# Patient Record
Sex: Female | Born: 1985 | Race: Black or African American | Hispanic: No | Marital: Married | Smoking: Former smoker
Health system: Southern US, Community
[De-identification: ages and names within clinical notes are randomized; demographics above are authoritative.]

## PROBLEM LIST (undated history)

## (undated) DIAGNOSIS — IMO0002 Reserved for concepts with insufficient information to code with codable children: Secondary | ICD-10-CM

## (undated) DIAGNOSIS — O24419 Gestational diabetes mellitus in pregnancy, unspecified control: Secondary | ICD-10-CM

## (undated) DIAGNOSIS — Z319 Encounter for procreative management, unspecified: Secondary | ICD-10-CM

## (undated) DIAGNOSIS — N920 Excessive and frequent menstruation with regular cycle: Secondary | ICD-10-CM

## (undated) DIAGNOSIS — I890 Lymphedema, not elsewhere classified: Secondary | ICD-10-CM

## (undated) DIAGNOSIS — E282 Polycystic ovarian syndrome: Secondary | ICD-10-CM

## (undated) DIAGNOSIS — N912 Amenorrhea, unspecified: Secondary | ICD-10-CM

## (undated) DIAGNOSIS — N926 Irregular menstruation, unspecified: Secondary | ICD-10-CM

## (undated) DIAGNOSIS — F329 Major depressive disorder, single episode, unspecified: Secondary | ICD-10-CM

## (undated) DIAGNOSIS — D649 Anemia, unspecified: Secondary | ICD-10-CM

## (undated) DIAGNOSIS — R87629 Unspecified abnormal cytological findings in specimens from vagina: Secondary | ICD-10-CM

## (undated) DIAGNOSIS — F32A Depression, unspecified: Secondary | ICD-10-CM

## (undated) HISTORY — DX: Polycystic ovarian syndrome: E28.2

## (undated) HISTORY — DX: Reserved for concepts with insufficient information to code with codable children: IMO0002

## (undated) HISTORY — DX: Major depressive disorder, single episode, unspecified: F32.9

## (undated) HISTORY — DX: Irregular menstruation, unspecified: N92.6

## (undated) HISTORY — DX: Depression, unspecified: F32.A

## (undated) HISTORY — PX: WISDOM TOOTH EXTRACTION: SHX21

## (undated) HISTORY — DX: Encounter for procreative management, unspecified: Z31.9

## (undated) HISTORY — DX: Excessive and frequent menstruation with regular cycle: N92.0

## (undated) HISTORY — DX: Unspecified abnormal cytological findings in specimens from vagina: R87.629

## (undated) HISTORY — DX: Amenorrhea, unspecified: N91.2

## (undated) HISTORY — DX: Lymphedema, not elsewhere classified: I89.0

## (undated) HISTORY — DX: Gestational diabetes mellitus in pregnancy, unspecified control: O24.419

## (undated) HISTORY — DX: Morbid (severe) obesity due to excess calories: E66.01

---
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
## 2004-10-04 ENCOUNTER — Emergency Department (HOSPITAL_COMMUNITY): Admission: EM | Admit: 2004-10-04 | Discharge: 2004-10-04 | Payer: Self-pay | Admitting: Emergency Medicine

## 2004-11-11 ENCOUNTER — Inpatient Hospital Stay (HOSPITAL_COMMUNITY): Admission: AD | Admit: 2004-11-11 | Discharge: 2004-11-15 | Payer: Self-pay | Admitting: Internal Medicine

## 2005-09-13 ENCOUNTER — Emergency Department (HOSPITAL_COMMUNITY): Admission: EM | Admit: 2005-09-13 | Discharge: 2005-09-13 | Payer: Self-pay | Admitting: Emergency Medicine

## 2005-12-18 ENCOUNTER — Inpatient Hospital Stay (HOSPITAL_COMMUNITY): Admission: EM | Admit: 2005-12-18 | Discharge: 2005-12-21 | Payer: Self-pay | Admitting: Emergency Medicine

## 2008-07-11 ENCOUNTER — Inpatient Hospital Stay (HOSPITAL_COMMUNITY): Admission: EM | Admit: 2008-07-11 | Discharge: 2008-07-14 | Payer: Self-pay | Admitting: Emergency Medicine

## 2008-07-14 ENCOUNTER — Encounter: Payer: Self-pay | Admitting: Family Medicine

## 2008-09-18 ENCOUNTER — Inpatient Hospital Stay (HOSPITAL_COMMUNITY): Admission: EM | Admit: 2008-09-18 | Discharge: 2008-09-20 | Payer: Self-pay | Admitting: Emergency Medicine

## 2009-02-05 ENCOUNTER — Ambulatory Visit: Payer: Self-pay | Admitting: Family Medicine

## 2009-02-05 DIAGNOSIS — R5383 Other fatigue: Secondary | ICD-10-CM

## 2009-02-05 DIAGNOSIS — I890 Lymphedema, not elsewhere classified: Secondary | ICD-10-CM

## 2009-02-05 DIAGNOSIS — R5381 Other malaise: Secondary | ICD-10-CM

## 2009-02-05 DIAGNOSIS — J309 Allergic rhinitis, unspecified: Secondary | ICD-10-CM

## 2009-02-09 ENCOUNTER — Telehealth: Payer: Self-pay | Admitting: Family Medicine

## 2009-02-09 LAB — CONVERTED CEMR LAB
Basophils Absolute: 0 10*3/uL (ref 0.0–0.1)
Chloride: 105 meq/L (ref 96–112)
Cholesterol: 216 mg/dL — ABNORMAL HIGH (ref 0–200)
Creatinine, Ser: 0.78 mg/dL (ref 0.40–1.20)
Eosinophils Absolute: 0.1 10*3/uL (ref 0.0–0.7)
HDL: 41 mg/dL (ref >39)
LDL Cholesterol: 134 mg/dL — ABNORMAL HIGH (ref 0–99)
Lymphocytes Relative: 25 % (ref 12–46)
Lymphs Abs: 3 10*3/uL (ref 0.7–4.0)
Neutrophils Relative %: 67 % (ref 43–77)
Platelets: 292 10*3/uL (ref 150–400)
Potassium: 4.3 meq/L (ref 3.5–5.3)
Triglycerides: 204 mg/dL — ABNORMAL HIGH (ref <150)
WBC: 11.7 10*3/uL — ABNORMAL HIGH (ref 4.0–10.5)

## 2009-02-16 ENCOUNTER — Ambulatory Visit (HOSPITAL_COMMUNITY): Admission: RE | Admit: 2009-02-16 | Discharge: 2009-02-16 | Payer: Self-pay | Admitting: Family Medicine

## 2009-02-19 ENCOUNTER — Inpatient Hospital Stay (HOSPITAL_COMMUNITY): Admission: EM | Admit: 2009-02-19 | Discharge: 2009-02-21 | Payer: Self-pay | Admitting: Emergency Medicine

## 2009-02-27 ENCOUNTER — Encounter: Payer: Self-pay | Admitting: Family Medicine

## 2009-04-28 ENCOUNTER — Encounter (INDEPENDENT_AMBULATORY_CARE_PROVIDER_SITE_OTHER): Payer: Self-pay | Admitting: *Deleted

## 2009-06-05 ENCOUNTER — Emergency Department (HOSPITAL_COMMUNITY): Admission: EM | Admit: 2009-06-05 | Discharge: 2009-06-05 | Payer: Self-pay | Admitting: Emergency Medicine

## 2009-06-09 ENCOUNTER — Ambulatory Visit: Payer: Self-pay | Admitting: Family Medicine

## 2009-07-27 ENCOUNTER — Inpatient Hospital Stay (HOSPITAL_COMMUNITY): Admission: EM | Admit: 2009-07-27 | Discharge: 2009-07-30 | Payer: Self-pay | Admitting: Emergency Medicine

## 2009-12-14 ENCOUNTER — Encounter: Payer: Self-pay | Admitting: Family Medicine

## 2009-12-25 ENCOUNTER — Encounter: Payer: Self-pay | Admitting: Family Medicine

## 2010-01-08 ENCOUNTER — Encounter: Payer: Self-pay | Admitting: Family Medicine

## 2010-03-11 ENCOUNTER — Encounter: Payer: Self-pay | Admitting: Family Medicine

## 2010-05-25 NOTE — Letter (Signed)
Summary: 2nd missed letter  2nd missed letter   Imported By: Lind Guest 03/12/2010 14:34:42  _____________________________________________________________________  External Attachment:    Type:   Image     Comment:   External Document

## 2010-05-25 NOTE — Assessment & Plan Note (Signed)
Summary: FOLLOW FROM HOS   Vital Signs:  Patient profile:   25 year old female Menstrual status:  irregular Height:      69.5 inches Weight:      332.25 pounds BMI:     48.54 O2 Sat:      97 % Pulse rate:   76 / minute Pulse rhythm:   regular Resp:     16 per minute BP sitting:   104 / 76  (left arm) Cuff size:   xl  Vitals Entered By: Everitt Amber (June 09, 2009 10:36 AM)  Nutrition Counseling: Patient's BMI is greater than 25 and therefore counseled on weight management options. CC: was in the hospital last friday, she was sick, fever, vomiting, and her left leg became infected and she is taking antibiotics from the [facility] which she did not bring Is Patient Diabetic? No Pain Assessment Patient in pain? yes        CC:  was in the hospital last friday, she was sick, fever, vomiting, and and her left leg became infected and she is taking antibiotics from the [facility] which she did not bring.  History of Present Illness: Pt in for f/u of recent ed visit for cough and congestion.  She does have  severe lymphedema.She did go to [facility] for reconsideration of mmanagement of the problem, but now that she has lost her ins is no longer eligible for f/u as she is a [place] resident. She hopesto find worlk again soon. She has been tryingto cut back on her intake to lose weight.with a little success.  Current Medications (verified): 1)  None  Allergies (verified): 1)  ! Pcn 2)  ! * Dust 3)  ! * Bee Stings 4)  ! * Mold  Review of Systems Eyes:  Denies blurring and discharge. ENT:  Denies nasal congestion, sinus pressure, and sore throat. CV:  Denies chest pain or discomfort and palpitations. Resp:  Complains of cough, shortness of breath, and sputum productive. GI:  Denies abdominal pain, constipation, diarrhea, nausea, and vomiting. GU:  Complains of abnormal vaginal bleeding; denies dysuria and urinary frequency. Neuro:  Complains of headaches; denies seizures and sensation of room  spinning. Endo:  Denies cold intolerance, excessive hunger, excessive thirst, excessive urination, heat intolerance, polyuria, and weight change. Heme:  Denies abnormal bruising and bleeding. Allergy:  Complains of seasonal allergies.  Physical Exam  General:  pleasant morbidly obese female in no c/p distress. HEENT: No facial asymmetry,  EOMI, No sinus tenderness, TM's Clear, oropharynx  pink and moist.   Chest: decreased air entry with bilateral crckles   CVS: S1, S2, No murmurs, No S3.   Abd: Soft, Nontender.  MS: Adequate ROM spine, hips, shoulders and knees.  ZOX:WRUEA lymphedema of left lower extremity   CNS: CN 2-12 intact, power tone and sensation normal throughout.   Skin: Intact, no visible lesions or rashes.  Psych: Good eye contact, normal affect.  Memory intact, not anxious or depressed appearing.    Impression & Recommendations:  Problem # 1:  ACUTE BRONCHITIS (ICD-466.0) Assessment Improved pt to complete antibiotic course recently prescribed  Problem # 2:  MORBID OBESITY (ICD-278.01) Assessment: Improved  Ht: 69.5 (06/09/2009)   Wt: 332.25 (06/09/2009)   BMI: 48.54 (06/09/2009)  Problem # 3:  AMENORRHEA (ICD-626.0) Assessment: Comment Only polycystic ovaries from pelvic US   Problem # 4:  LYMPHEDEMA (ICD-457.1) Assessment: Unchanged  Complete Medication List: 1)  Phentermine Hcl 37.5 Mg Tabs (Phentermine hcl) .... Take 1 tablet  by mouth once a day  Patient Instructions: 1)  f/u as needed 2)  It is important that you exercise regularly at least 20 minutes 5 times a week. If you develop chest pain, have severe difficulty breathing, or feel very tired , stop exercising immediately and seek medical attention. 3)  You need to lose weight. Consider a lower calorie diet and regular exercise.  4)  pLS take phentermine hALf daily not one whole as prescribed. 5)  pls complete the course of antibiotics  Prescriptions: PHENTERMINE HCL 37.5 MG TABS (PHENTERMINE  HCL) Take 1 tablet by mouth once a day  #30 x 0   Entered and Authorized by:   Syliva Overman MD   Signed by:   Syliva Overman MD on 06/09/2009   Method used:   Printed then faxed to ...       [facility] [address] [facility] (retail)       [address]       [place], [place]  [address]       Ph: [phone]       Fax: [phone]   RxID:   [id number]

## 2010-05-25 NOTE — Letter (Signed)
Summary: 1st Missed Appt.  [facility]  [address]   [place], [place] [address]   Phone: [phone]  Fax: [phone]    April 28, 2009  MRN: [id number]  ZELPHA MESSING [address] [place], [place]  [address]  Dear Ms. Seward Meth,  At [facility], we make every attempt to fit patients into our schedule by reserving several appointment slots for same-day appointments.  However, we cannot always make appointments for patients the same day they are calling.  At the end of the day, we look back at our schedule and find that because of last-minute cancellations and patients not showing up for their scheduled appointments, we have several appointment slots that are left open and could have been used by another person who really needed it.  In the past, you may have been one of the patients who could not get in when you needed to.  But recently, you were one of the patients with an appointment that you didn't show up for or canceled too late for [place] to fill it.  We choose not to charge no-show or last minute cancellation fees to our patients, like many other offices do.  We do not wish to institute that policy and hope we never have to.  However, we kindly request that you assist [place] by providing at least 24 hours' notice if you can't make your appointment.  If no-shows or late cancellations become habitual (i.e. Three or more in a one-year period), we may terminate the physician-patient relationship.    Thank you for your consideration and cooperation.   Altamease Oiler

## 2010-05-25 NOTE — Letter (Signed)
Summary: MEDICAL RELEASE  MEDICAL RELEASE   Imported By: Lind Guest 01/06/2010 13:40:14  _____________________________________________________________________  External Attachment:    Type:   Image     Comment:   External Document

## 2010-05-25 NOTE — Letter (Signed)
Summary: MEDICAL ELEASE  MEDICAL ELEASE   Imported By: Lind Guest 01/08/2010 09:17:33  _____________________________________________________________________  External Attachment:    Type:   Image     Comment:   External Document

## 2010-05-25 NOTE — Letter (Signed)
Summary: MEDICAL RELEASE  MEDICAL RELEASE   Imported By: Lind Guest 12/18/2009 11:23:31  _____________________________________________________________________  External Attachment:    Type:   Image     Comment:   External Document

## 2010-07-14 LAB — COMPREHENSIVE METABOLIC PANEL
ALT: 30 U/L (ref 0–35)
AST: 21 U/L (ref 0–37)
Albumin: 3 g/dL — ABNORMAL LOW (ref 3.5–5.2)
Calcium: 8.5 mg/dL (ref 8.4–10.5)
Creatinine, Ser: 1.04 mg/dL (ref 0.4–1.2)
GFR calc Af Amer: >60 mL/min (ref >60)
GFR calc non Af Amer: >60 mL/min (ref >60)
Sodium: 135 mEq/L (ref 135–145)
Total Protein: 6.1 g/dL (ref 6.0–8.3)

## 2010-07-14 LAB — CBC
HCT: 30.9 % — ABNORMAL LOW (ref 36.0–46.0)
Hemoglobin: 10.6 g/dL — ABNORMAL LOW (ref 12.0–15.0)
Hemoglobin: 12.4 g/dL (ref 12.0–15.0)
MCHC: 33.6 g/dL (ref 30.0–36.0)
MCHC: 34.2 g/dL (ref 30.0–36.0)
MCHC: 34.3 g/dL (ref 30.0–36.0)
MCV: 79 fL (ref 78.0–100.0)
MCV: 79.8 fL (ref 78.0–100.0)
Platelets: 324 10*3/uL (ref 150–400)
Platelets: 190 10*3/uL (ref 150–400)
RBC: 3.87 MIL/uL (ref 3.87–5.11)
RBC: 4.63 MIL/uL (ref 3.87–5.11)
RDW: 14.5 % (ref 11.5–15.5)
RDW: 15.5 % (ref 11.5–15.5)
WBC: 11.5 10*3/uL — ABNORMAL HIGH (ref 4.0–10.5)
WBC: 19.6 10*3/uL — ABNORMAL HIGH (ref 4.0–10.5)

## 2010-07-14 LAB — BASIC METABOLIC PANEL
BUN: 7 mg/dL (ref 6–23)
CO2: 24 mEq/L (ref 19–32)
CO2: 27 mEq/L (ref 19–32)
Calcium: 9.1 mg/dL (ref 8.4–10.5)
Calcium: 8.2 mg/dL — ABNORMAL LOW (ref 8.4–10.5)
Calcium: 9.2 mg/dL (ref 8.4–10.5)
Chloride: 102 mEq/L (ref 96–112)
Creatinine, Ser: 0.87 mg/dL (ref 0.4–1.2)
Creatinine, Ser: 0.89 mg/dL (ref 0.4–1.2)
GFR calc Af Amer: >60 mL/min (ref >60)
GFR calc Af Amer: >60 mL/min (ref >60)
GFR calc non Af Amer: >60 mL/min (ref >60)
Glucose, Bld: 94 mg/dL (ref 70–99)
Potassium: 3.5 mEq/L (ref 3.5–5.1)
Potassium: 3.9 mEq/L (ref 3.5–5.1)
Sodium: 135 mEq/L (ref 135–145)
Sodium: 136 mEq/L (ref 135–145)

## 2010-07-14 LAB — CULTURE, BLOOD (ROUTINE X 2)
Culture: NO GROWTH
Culture: NO GROWTH

## 2010-07-14 LAB — DIFFERENTIAL
Basophils Absolute: 0.1 10*3/uL (ref 0.0–0.1)
Eosinophils Relative: 1 % (ref 0–5)
Eosinophils Relative: 0 % (ref 0–5)
Lymphocytes Relative: 26 % (ref 12–46)
Lymphocytes Relative: 22 % (ref 12–46)
Lymphocytes Relative: 40 % (ref 12–46)
Lymphs Abs: 2.9 10*3/uL (ref 0.7–4.0)
Lymphs Abs: 2.5 10*3/uL (ref 0.7–4.0)
Lymphs Abs: 3.2 10*3/uL (ref 0.7–4.0)
Monocytes Absolute: 0.4 10*3/uL (ref 0.1–1.0)
Monocytes Absolute: 0.6 10*3/uL (ref 0.1–1.0)
Monocytes Absolute: 0.9 10*3/uL (ref 0.1–1.0)
Monocytes Relative: 10 % (ref 3–12)
Monocytes Relative: 3 % (ref 3–12)
Monocytes Relative: 3 % (ref 3–12)
Neutro Abs: 8 10*3/uL — ABNORMAL HIGH (ref 1.7–7.7)
Neutro Abs: 16.6 10*3/uL — ABNORMAL HIGH (ref 1.7–7.7)
Neutro Abs: 4.5 10*3/uL (ref 1.7–7.7)
Neutrophils Relative %: 69 % (ref 43–77)
Neutrophils Relative %: 84 % — ABNORMAL HIGH (ref 43–77)

## 2010-07-14 LAB — URINALYSIS, ROUTINE W REFLEX MICROSCOPIC
Bilirubin Urine: NEGATIVE
Leukocytes, UA: NEGATIVE
Nitrite: NEGATIVE
Specific Gravity, Urine: 1.015 (ref 1.005–1.030)
Urobilinogen, UA: 1 mg/dL (ref 0.0–1.0)
pH: 6 (ref 5.0–8.0)

## 2010-07-14 LAB — PROTIME-INR: Prothrombin Time: 15.3 seconds — ABNORMAL HIGH (ref 11.6–15.2)

## 2010-07-14 LAB — URINE MICROSCOPIC-ADD ON

## 2010-07-14 LAB — PREGNANCY, URINE: Preg Test, Ur: NEGATIVE

## 2010-07-29 LAB — DIFFERENTIAL
Basophils Relative: 0 % (ref 0–1)
Eosinophils Absolute: 0 10*3/uL (ref 0.0–0.7)
Eosinophils Absolute: 0 10*3/uL (ref 0.0–0.7)
Eosinophils Relative: 0 % (ref 0–5)
Eosinophils Relative: 0 % (ref 0–5)
Lymphs Abs: 0.9 10*3/uL (ref 0.7–4.0)
Monocytes Absolute: 0.8 10*3/uL (ref 0.1–1.0)
Monocytes Relative: 3 % (ref 3–12)
Monocytes Relative: 9 % (ref 3–12)
Neutrophils Relative %: 66 % (ref 43–77)

## 2010-07-29 LAB — CBC
HCT: 34.3 % — ABNORMAL LOW (ref 36.0–46.0)
Hemoglobin: 11.5 g/dL — ABNORMAL LOW (ref 12.0–15.0)
MCHC: 33.6 g/dL (ref 30.0–36.0)
MCV: 80.8 fL (ref 78.0–100.0)
Platelets: 209 10*3/uL (ref 150–400)
RBC: 4.24 MIL/uL (ref 3.87–5.11)
WBC: 16.4 10*3/uL — ABNORMAL HIGH (ref 4.0–10.5)

## 2010-07-29 LAB — CULTURE, BLOOD (ROUTINE X 2): Culture: NO GROWTH

## 2010-07-29 LAB — URINE MICROSCOPIC-ADD ON

## 2010-07-29 LAB — COMPREHENSIVE METABOLIC PANEL
ALT: 19 U/L (ref 0–35)
AST: 18 U/L (ref 0–37)
Calcium: 9.4 mg/dL (ref 8.4–10.5)
GFR calc Af Amer: >60 mL/min (ref >60)
Sodium: 134 mEq/L — ABNORMAL LOW (ref 135–145)
Total Protein: 8 g/dL (ref 6.0–8.3)

## 2010-07-29 LAB — BASIC METABOLIC PANEL
CO2: 28 mEq/L (ref 19–32)
Chloride: 104 mEq/L (ref 96–112)
Creatinine, Ser: 1.18 mg/dL (ref 0.4–1.2)
GFR calc Af Amer: >60 mL/min (ref >60)
Potassium: 3.8 mEq/L (ref 3.5–5.1)

## 2010-07-29 LAB — RAPID STREP SCREEN (MED CTR MEBANE ONLY): Streptococcus, Group A Screen (Direct): NEGATIVE

## 2010-07-29 LAB — PROTIME-INR: Prothrombin Time: 13.9 seconds (ref 11.6–15.2)

## 2010-07-29 LAB — URINALYSIS, ROUTINE W REFLEX MICROSCOPIC
Glucose, UA: NEGATIVE mg/dL
Specific Gravity, Urine: 1.015 (ref 1.005–1.030)

## 2010-08-03 LAB — DIFFERENTIAL
Basophils Relative: 0 % (ref 0–1)
Eosinophils Absolute: 0 10*3/uL (ref 0.0–0.7)
Eosinophils Absolute: 0.1 10*3/uL (ref 0.0–0.7)
Eosinophils Relative: 0 % (ref 0–5)
Eosinophils Relative: 1 % (ref 0–5)
Lymphocytes Relative: 33 % (ref 12–46)
Lymphocytes Relative: 34 % (ref 12–46)
Lymphocytes Relative: 5 % — ABNORMAL LOW (ref 12–46)
Lymphs Abs: 1.3 10*3/uL (ref 0.7–4.0)
Lymphs Abs: 3.7 10*3/uL (ref 0.7–4.0)
Monocytes Absolute: 0.7 10*3/uL (ref 0.1–1.0)
Monocytes Relative: 1 % — ABNORMAL LOW (ref 3–12)
Monocytes Relative: 7 % (ref 3–12)
Monocytes Relative: 7 % (ref 3–12)
Neutro Abs: 5.7 10*3/uL (ref 1.7–7.7)
Neutro Abs: 6.5 10*3/uL (ref 1.7–7.7)
Neutrophils Relative %: 59 % (ref 43–77)

## 2010-08-03 LAB — BASIC METABOLIC PANEL
BUN: 6 mg/dL (ref 6–23)
CO2: 26 mEq/L (ref 19–32)
Calcium: 8.1 mg/dL — ABNORMAL LOW (ref 8.4–10.5)
Calcium: 8.6 mg/dL (ref 8.4–10.5)
Creatinine, Ser: 0.86 mg/dL (ref 0.4–1.2)
GFR calc Af Amer: >60 mL/min (ref >60)
GFR calc Af Amer: >60 mL/min (ref >60)
GFR calc non Af Amer: >60 mL/min (ref >60)
Glucose, Bld: 81 mg/dL (ref 70–99)
Potassium: 3.6 mEq/L (ref 3.5–5.1)
Sodium: 143 mEq/L (ref 135–145)

## 2010-08-03 LAB — CULTURE, BLOOD (ROUTINE X 2): Report Status: 5312010

## 2010-08-03 LAB — COMPREHENSIVE METABOLIC PANEL
ALT: 17 U/L (ref 0–35)
AST: 21 U/L (ref 0–37)
Calcium: 9 mg/dL (ref 8.4–10.5)
Creatinine, Ser: 1 mg/dL (ref 0.4–1.2)
GFR calc Af Amer: >60 mL/min (ref >60)
Sodium: 136 mEq/L (ref 135–145)
Total Protein: 6.8 g/dL (ref 6.0–8.3)

## 2010-08-03 LAB — CBC
HCT: 35 % — ABNORMAL LOW (ref 36.0–46.0)
Hemoglobin: 11.9 g/dL — ABNORMAL LOW (ref 12.0–15.0)
MCHC: 34 g/dL (ref 30.0–36.0)
MCHC: 34.1 g/dL (ref 30.0–36.0)
MCV: 80.8 fL (ref 78.0–100.0)
MCV: 81.1 fL (ref 78.0–100.0)
Platelets: 214 10*3/uL (ref 150–400)
Platelets: 283 10*3/uL (ref 150–400)
RBC: 3.85 MIL/uL — ABNORMAL LOW (ref 3.87–5.11)
RBC: 4.32 MIL/uL (ref 3.87–5.11)
RDW: 14.1 % (ref 11.5–15.5)
WBC: 11.1 10*3/uL — ABNORMAL HIGH (ref 4.0–10.5)

## 2010-08-03 LAB — URINALYSIS, ROUTINE W REFLEX MICROSCOPIC
Bilirubin Urine: NEGATIVE
Glucose, UA: NEGATIVE mg/dL
Ketones, ur: NEGATIVE mg/dL
Protein, ur: NEGATIVE mg/dL
pH: 5 (ref 5.0–8.0)

## 2010-08-03 LAB — URINE CULTURE: Colony Count: 8000

## 2010-08-03 LAB — LACTIC ACID, PLASMA: Lactic Acid, Venous: 2 mmol/L (ref 0.5–2.2)

## 2010-08-03 LAB — PREGNANCY, URINE: Preg Test, Ur: NEGATIVE

## 2010-08-05 LAB — BASIC METABOLIC PANEL
BUN: 4 mg/dL — ABNORMAL LOW (ref 6–23)
CO2: 23 mEq/L (ref 19–32)
CO2: 26 mEq/L (ref 19–32)
CO2: 26 mEq/L (ref 19–32)
Calcium: 8.4 mg/dL (ref 8.4–10.5)
Chloride: 99 mEq/L (ref 96–112)
GFR calc Af Amer: >60 mL/min (ref >60)
GFR calc Af Amer: >60 mL/min (ref >60)
GFR calc non Af Amer: >60 mL/min (ref >60)
GFR calc non Af Amer: >60 mL/min (ref >60)
Glucose, Bld: 90 mg/dL (ref 70–99)
Glucose, Bld: 96 mg/dL (ref 70–99)
Potassium: 3.1 mEq/L — ABNORMAL LOW (ref 3.5–5.1)
Potassium: 4.1 mEq/L (ref 3.5–5.1)
Potassium: 4.4 mEq/L (ref 3.5–5.1)
Sodium: 133 mEq/L — ABNORMAL LOW (ref 135–145)
Sodium: 138 mEq/L (ref 135–145)
Sodium: 140 mEq/L (ref 135–145)

## 2010-08-05 LAB — CBC
HCT: 32.7 % — ABNORMAL LOW (ref 36.0–46.0)
HCT: 34.6 % — ABNORMAL LOW (ref 36.0–46.0)
HCT: 37 % (ref 36.0–46.0)
Hemoglobin: 10.9 g/dL — ABNORMAL LOW (ref 12.0–15.0)
Hemoglobin: 11.5 g/dL — ABNORMAL LOW (ref 12.0–15.0)
Hemoglobin: 12.3 g/dL (ref 12.0–15.0)
MCHC: 33.1 g/dL (ref 30.0–36.0)
MCHC: 33.3 g/dL (ref 30.0–36.0)
MCHC: 33.4 g/dL (ref 30.0–36.0)
MCV: 81.4 fL (ref 78.0–100.0)
MCV: 82.8 fL (ref 78.0–100.0)
Platelets: 228 10*3/uL (ref 150–400)
RBC: 3.97 MIL/uL (ref 3.87–5.11)
RBC: 4.55 MIL/uL (ref 3.87–5.11)
RDW: 14.1 % (ref 11.5–15.5)
RDW: 14.3 % (ref 11.5–15.5)
WBC: 17.4 10*3/uL — ABNORMAL HIGH (ref 4.0–10.5)

## 2010-08-05 LAB — DIFFERENTIAL
Basophils Absolute: 0 10*3/uL (ref 0.0–0.1)
Basophils Absolute: 0 10*3/uL (ref 0.0–0.1)
Basophils Relative: 0 % (ref 0–1)
Basophils Relative: 0 % (ref 0–1)
Eosinophils Absolute: 0 10*3/uL (ref 0.0–0.7)
Eosinophils Absolute: 0.1 10*3/uL (ref 0.0–0.7)
Eosinophils Relative: 0 % (ref 0–5)
Eosinophils Relative: 1 % (ref 0–5)
Lymphocytes Relative: 26 % (ref 12–46)
Lymphocytes Relative: 35 % (ref 12–46)
Lymphs Abs: 2.6 10*3/uL (ref 0.7–4.0)
Monocytes Absolute: 0.5 10*3/uL (ref 0.1–1.0)
Monocytes Absolute: 0.7 10*3/uL (ref 0.1–1.0)
Monocytes Absolute: 0.7 10*3/uL (ref 0.1–1.0)
Monocytes Relative: 3 % (ref 3–12)
Monocytes Relative: 6 % (ref 3–12)
Neutro Abs: 8.1 10*3/uL — ABNORMAL HIGH (ref 1.7–7.7)

## 2010-08-05 LAB — VANCOMYCIN, TROUGH: Vancomycin Tr: 11.3 ug/mL (ref 10.0–20.0)

## 2010-08-20 ENCOUNTER — Encounter: Payer: Self-pay | Admitting: Family Medicine

## 2010-08-24 ENCOUNTER — Encounter: Payer: Self-pay | Admitting: Family Medicine

## 2010-09-02 ENCOUNTER — Emergency Department (HOSPITAL_COMMUNITY): Payer: BC Managed Care – PPO

## 2010-09-02 ENCOUNTER — Emergency Department (HOSPITAL_COMMUNITY)
Admission: EM | Admit: 2010-09-02 | Discharge: 2010-09-02 | Disposition: A | Discharge status: Home / Self Care | Payer: BC Managed Care – PPO | Attending: Emergency Medicine | Admitting: Emergency Medicine

## 2010-09-02 DIAGNOSIS — R51 Headache: Secondary | ICD-10-CM | POA: Insufficient documentation

## 2010-09-02 DIAGNOSIS — J069 Acute upper respiratory infection, unspecified: Secondary | ICD-10-CM | POA: Insufficient documentation

## 2010-09-02 DIAGNOSIS — R059 Cough, unspecified: Secondary | ICD-10-CM | POA: Insufficient documentation

## 2010-09-02 DIAGNOSIS — R05 Cough: Secondary | ICD-10-CM | POA: Insufficient documentation

## 2010-09-02 LAB — DIFFERENTIAL
Basophils Relative: 0 % (ref 0–1)
Eosinophils Absolute: 0.2 10*3/uL (ref 0.0–0.7)
Lymphs Abs: 4.1 10*3/uL — ABNORMAL HIGH (ref 0.7–4.0)
Monocytes Absolute: 0.6 10*3/uL (ref 0.1–1.0)
Monocytes Relative: 7 % (ref 3–12)
Neutrophils Relative %: 49 % (ref 43–77)

## 2010-09-02 LAB — URINALYSIS, ROUTINE W REFLEX MICROSCOPIC
Glucose, UA: NEGATIVE mg/dL
Hgb urine dipstick: NEGATIVE
Ketones, ur: NEGATIVE mg/dL
Protein, ur: NEGATIVE mg/dL
Urobilinogen, UA: 0.2 mg/dL (ref 0.0–1.0)

## 2010-09-02 LAB — CBC
MCH: 25.8 pg — ABNORMAL LOW (ref 26.0–34.0)
MCHC: 30.9 g/dL (ref 30.0–36.0)
MCV: 83.7 fL (ref 78.0–100.0)
Platelets: 255 10*3/uL (ref 150–400)
RBC: 4.18 MIL/uL (ref 3.87–5.11)

## 2010-09-02 LAB — BASIC METABOLIC PANEL
BUN: 8 mg/dL (ref 6–23)
CO2: 29 mEq/L (ref 19–32)
Calcium: 9.4 mg/dL (ref 8.4–10.5)
Chloride: 103 mEq/L (ref 96–112)
Creatinine, Ser: 0.78 mg/dL (ref 0.4–1.2)

## 2010-09-07 NOTE — Discharge Summary (Signed)
[name], [name] NO.:  [id number]   MEDICAL RECORD NO.:  [id number]          PATIENT TYPE:  INP   LOCATION:  A308                          FACILITY:  [facility]   PHYSICIAN:  [name] Shipper, MD     DATE OF BIRTH:  [birth year]   DATE OF ADMISSION:  07/11/2008  DATE OF DISCHARGE:  03/22/2010LH                               DISCHARGE SUMMARY   PRIMARY CARE PHYSICIAN:  The patient does not have a PMD.  She used to  see Dr. Felecia Horton.   DISCHARGE DIAGNOSES:  1. Left lower extremity cellulitis, improved.  2. Left lower extremity lymphedema, chronic.  3. Morbid obesity.   HISTORY OF PRESENT ILLNESS:  Please review the history and physical  dictated at the time of admission for details regarding the patient's  presenting illness.   HOSPITAL COURSE:  Briefly, this is a 25 year old African American female  who is morbidly obese and who has lymphedema especially of the left  lower extremity who presented to the hospital with complaints of pain,  swelling, fever.  The patient was noted to have cellulitis.  She was  started on Levaquin and vancomycin.  She was quick to improve.  Her pain  has improved.  She is able to ambulate now with no difficulties.  She  has been afebrile.  Her white count was 17,000 on admission and has come  down to normal.  Blood cultures have not grown any organisms.  She has  been afebrile.  On the day of discharge the patient is feeling quite  well.  She denies any complaints.  The pain is much better.   PHYSICAL EXAMINATION:  VITAL SIGNS:  Physical examination reveals she  has been afebrile, heart rate 81, respiratory rate 20, blood pressure  92/54, saturation 97% on room air.  EXTREMITIES:  Left lower extremity does show improving swelling. The  warmth and erythema has also reduced.   CONDITION ON DISCHARGE:  Overall, the patient is stable for discharge.   DISCHARGE MEDICATIONS:  1. Levaquin 500 mg orally once daily for 10 days.  2. Percocet  5/325 one tablet every 6 hours as needed for pain, #15      tablets are being prescribed.   FOLLOWUP:  We will try to make her an appointment with Dr. Felecia Horton or with  the unassigned physician for July 21, 2008 in 1 week.   DIET:  As before.   PHYSICAL ACTIVITY:  As before.   Total time on this encounter 35 minutes.      [name] Shipper, MD  Electronically Signed     GK/MEDQ  D:  07/14/2008  T:  07/14/2008  Job:  [id number]   cc:   [name] D. [name] Shelling, MD  Fax: [phone]

## 2010-09-07 NOTE — Group Therapy Note (Signed)
[name], [name] NO.:  [id number]   MEDICAL RECORD NO.:  [id number]          PATIENT TYPE:  INP   LOCATION:  A315                          FACILITY:  [facility]   PHYSICIAN:  Osvaldo Shipper, MD     DATE OF BIRTH:  1986/02/12   DATE OF PROCEDURE:  DATE OF DISCHARGE:                                 PROGRESS NOTE   SUBJECTIVE:  The patient is feeling better, denies any nausea or  vomiting.  No other complaints are offered.   OBJECTIVE:  VITAL SIGNS:  Temperature 98.5.  She has remained afebrile  in the last 24 hours.  Heart rate 92, respiratory rate 20, blood  pressure 106/81.  Saturation 99% on room air.  LUNGS:  Clear to auscultation bilaterally.  ABDOMEN:  Soft, obese.  EXTREMITIES:  Left lower extremity is swollen because of chronic  lymphedema, but it is warm, but the warmth is less compared to  yesterday.  It is less erythematous compared to yesterday.  Still tender  in the area around the ankle, especially in the fold between her foot  and her lower leg.   LABORATORY DATA:  All the labs are pending this morning.   ASSESSMENT/PLAN:  1. Fever with leukocytosis.  The likely source is cellulitis of her      left lower extremity.  She is predisposed to cellulitis because of      her chronic lymphedema.  I went over all the hygiene measures that      she can take to avoid future episodes of infection.  Currently, for      the infection we are treated her with vancomycin and Avelox.  She      has responded well to this combination of medication in the past.      She had low blood pressures yesterday when she was admitted and      those have also improved with aggressive IV hydration.  She has      been evaluated for her chronic left lower extremity lymphedema at      tertiary centers and no therapeutic options are available as per      the patient.  2. She is on deep venous thrombosis prophylaxis.  3. So basically we will await improvement in cellulitis before  the      patient is able to be discharged home.      Osvaldo Shipper, MD  Electronically Signed     GK/MEDQ  D:  09/19/2008  T:  09/19/2008  Job:  [id number]

## 2010-09-07 NOTE — H&P (Signed)
[name], [name] NO.:  [id number]   MEDICAL RECORD NO.:  [id number]          PATIENT TYPE:  INP   LOCATION:  A315                          FACILITY:  [facility]   PHYSICIAN:  Osvaldo Shipper, MD     DATE OF BIRTH:  03/01/1986   DATE OF ADMISSION:  09/17/2008  DATE OF DISCHARGE:  LH                              HISTORY & PHYSICAL   PMD:  Used to be Dr. Felecia Shelling but she does not see him anymore.   ADMISSION DIAGNOSES:  1. Fever, chills, likely secondary to cellulitis of the left lower      extremity.  2. Chronic left lower extremity lymphedema.  3. Sepsis.   CHIEF COMPLAINT:  Feeling unwell for 1 day.   HISTORY OF PRESENT ILLNESS:  Patient is a 25 year old African American  female who was recently admitted to the hospital in March for left lower  extremity cellulitis.  She was discharged on Levaquin orally.  Patient  mentioned that she did well after the discharge and was in her usual  state of health until about 2 weeks ago when she started developing  symptoms of cough and cold.  She was coughing up whitish mucus,  occasionally yellow in color.  Then today she started feeling a little  bit short of breath.  She started shaking.  She felt hot and warm but  did not check her temperature.  She denies any chest pain.  She had 4  episodes of diarrhea, which were watery, loose.  No blood in it.  She  also has headache bilaterally but no history of any neck pain.  Denies  any symptoms suggestive of dysuria.  She has irregular menstruations.   HOME MEDICATIONS:  She took an Advil before she came into the ED,  otherwise she is not on any scheduled medications.   ALLERGIES:  PENICILLIN.   PAST MEDICAL HISTORY:  Positive for lymphedema and recurrent episodes of  cellulitis of the left lower extremity.   SOCIAL HISTORY:  Lives in [place].  Denies smoking, alcohol, or  illicit drug use.  She works in an [employer] call center.   FAMILY HISTORY:  Positive for diabetes, high  blood pressure, breast  cancer.   REVIEW OF SYSTEMS:  GENERAL:  Positive for weakness, malaise.  HEENT:  Unremarkable.  CARDIOVASCULAR:  Unremarkable.  RESPIRATORY:  As in HPI.  GI:  Unremarkable.  GU:  Unremarkable.  NEUROLOGIC:  Unremarkable.  PSYCHIATRIC:  Unremarkable.  MUSCULOSKELETAL:  Unremarkable.  DERMATOLOGICAL:  Unremarkable.  Other systems unremarkable.   PHYSICAL EXAMINATION:  VITAL SIGNS:  Initially, temperature was 100.6,  blood pressure 85/45, pulse rate 123, respiratory rate 16, saturation  96% on room air.  Blood pressure is improved to 198/53, temperature  99.9, heart rate 103, respiratory rate is about 20.  Saturation is 100%  on room air.  GENERAL:  This is an obese black female in no distress.  HEENT:  There is no pallor, no icterus.  Oral mucous membranes are dry.  No oral lesions are noted.  NECK:  Soft and supple.  No thyromegaly is appreciated.  No  lymphadenopathy is  noted.  LUNGS:  Clear to auscultation bilaterally.  No wheezing, rales, or  rhonchi.  CARDIOVASCULAR:  S1 and S2 is normal, regular.  No murmurs appreciated.  No S3, S4, no rubs, no bruits.  ABDOMEN:  Soft.  Nontender, nondistended.  Bowel sounds are present.  No  masses or organomegaly is appreciated.  GU:  Deferred.  MUSCULOSKELETAL:  Unremarkable.  DERMATOLOGIC:  She has evidence for a slight degree of erythema of the  left leg as well as that leg is very, very warm to touch, compared to  the right side.  There is some tenderness anteriorly that is noted as  well.  NEUROLOGIC:  She is alert and oriented x3.  No focal neurological  deficits are present.   LAB DATA:  Her white count is 25,200, hemoglobin 12.7, platelet count  283, 94% neutrophils identified.  BMET shows an albumin of 3.3,  otherwise unremarkable.  UA shows low specific gravity, otherwise  unremarkable.  Blood cultures have been sent.   Chest x-ray did not show any acute cardiopulmonary process.   ASSESSMENT:  This is a  25 year old [place] American female with a  history of lymphedema, who presents with fever and chills.  Has  leukocytosis.  I think the source of her infection is most likely  cellulitis of her left leg.  Pneumonia is a possibility, although  examination does not suggest it.  She has a headache, likely because of  her acute infectious process and her fever.  Meningitis is pretty low on  the list of differential diagnoses at this time.  She had evidence for  sepsis when she came in with low blood pressures and tachycardia, and  those parameters have improved.   PLAN:  1. Fever with leukocytosis, likely secondary to cellulitis:  We will      treat this with vancomycin and Avelox.  Blood pressures have been      obtained, and we will follow up with the results of those.  Her      blood pressure will be supported with aggressive IV hydration.  She      will be monitored on telemetry 4th floor at this time.  No need for      ICU because blood pressures have improved.  However, we will      monitor her closely.  2. Chronic left lower extremity lymphedema:  She has been to tertiary      academic centers and no therapeutic option is available for this      individual.  3. Deep venous thrombosis prophylaxis will be initiated.  4. Will check a urine pregnancy test.   Further management decision will be based on the results of initial  testing and patient's response to treatment.      Osvaldo Shipper, MD  Electronically Signed     GK/MEDQ  D:  09/18/2008  T:  09/18/2008  Job:  [id number]   cc:   Ninetta Lights D. Felecia Shelling, MD  Fax: [phone]

## 2010-09-07 NOTE — H&P (Signed)
NAMECHERINA, DHILLON NO.:  [id number]   MEDICAL RECORD NO.:  [id number]          Horton TYPE:  INP   LOCATION:  A308                          FACILITY:  [facility]   PHYSICIAN:  Osvaldo Shipper, MD     DATE OF BIRTH:  Feb 13, 1986   DATE OF ADMISSION:  07/11/2008  DATE OF DISCHARGE:  LH                              HISTORY & PHYSICAL   [name] does not have a PMD.  [name] used to see Dr. Felecia Shelling, but has not  seen him in 3 years.   ADMITTING DIAGNOSES:  1. Left lower extremity cellulitis.  2. Lymphedema.  3. Obesity.   CHIEF COMPLAINT:  Left leg pain and swelling.   HISTORY OF PRESENT ILLNESS:  [name] is a 25 year old African  American female who has a history of lymphedema involving mostly her  left leg, who periodically has had infections in that leg for [name] past  many years.  [name] has been evaluated at tertiary centers including [facility],  but her condition is not thought to be curable.  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  [name]  leg also swelled up, [name] also experienced fever, chills, nausea,  vomiting.  [name] felt weak and tired.  Pain was severe, 8/10 in intensity,  worse with movements, and worse with ambulation and so [name] has not been  able to walk since last night.  Denies any shortness of breath.   MEDICATIONS AT HOME:  None.   ALLERGIES:  PENICILLIN, reaction is unknown.   PAST MEDICAL HISTORY:  None apart from [name] lymphedema and recurrent  cellulitis of [name] lower extremity.   SOCIAL HISTORY:  [name] lives here in [place].  Denies smoking, alcohol,  or illicit drug use.   FAMILY HISTORY:  Positive for lymphedema, diabetes, and breast cancer.   REVIEW OF SYSTEMS:  GENERAL:  Positive for weakness and malaise.  HEENT:  Unremarkable.  CARDIOVASCULAR:   Unremarkable.  RESPIRATORY:  Unremarkable.  GI:  Unremarkable.  GU:  Unremarkable.  NEUROLOGIC:  Unremarkable.  PSYCHIATRIC:  Unremarkable.  MUSCULOSKELETAL:  Unremarkable.  DERMATOLOGICAL:  As in HPI.   PHYSICAL EXAMINATION:  VITAL SIGNS:  Temperature 97.5, heart rate 88,  respiratory rate 20, blood pressure 111/74, saturation 99% on room air.  GENERAL:  This is a morbidly obese African American female in no  distress.  HEENT:  There is no pallor.  No icterus.  Oral mucous membrane is moist.  No oral lesions are noted.  NECK:  Soft and supple.  No thyromegaly is appreciated.  LUNGS:  Clear to auscultation anteriorly bilaterally.  No wheezing,  rales, or rhonchi.  CARDIOVASCULAR:  S1, S2 is normal and regular.  No murmurs appreciated.  No S3, S4.  No rubs.  No bruits.  ABDOMEN:  Obese, nontender, nondistended.  EXTREMITIES:  Left leg is extremely swollen.  Evidence for lymphedema is  present.  [name] anterior part of [name] leg is warm and tender to touch.  There is slight degree of erythema also noted despite [name] dark-colored  skin.  NEUROLOGIC:  [name] is alert and oriented x3.  No focal neurological  deficits are present.   LABORATORY DATA:  White count of 17,400, hemoglobin is 12.3, platelet  count is 235.  Sodium is 132, potassium is 3.1, glucose 103, creatinine  1.1.   ASSESSMENT:  This is a 25 year old [place] American female who has a  history of lymphedema, presents with left leg pain, swelling.  [name] has  elevated white count and mostly likely has evidence of cellulitis.   DIFFERENTIAL DIAGNOSES:  Deep venous thrombosis which is less likely.   PLAN:  Left lower extremity cellulitis.  We will treat with Levaquin.  We will add vancomycin.  Blood cultures will be followed up on.  Pain  control will be provided.  DVT prophylaxis will be initiated.   IV fluids will be given for mild dehydration.  Hypokalemia will be  repleted.  Magnesium level will be checked in [name] morning.    Further management decisions will depend on results of further testing  and [name]'s response to treatment.      Osvaldo Shipper, MD  Electronically Signed     GK/MEDQ  D:  07/11/2008  T:  07/12/2008  Job:  [id number]   cc:   Tesfaye D. Felecia Shelling, MD  Fax: [phone]

## 2010-09-10 NOTE — H&P (Signed)
[name], [name]           ACCOUNT NO.:  [id number]   MEDICAL RECORD NO.:  [id number]          PATIENT TYPE:  INP   LOCATION:  A417                          FACILITY:  [facility]   PHYSICIAN:  Tesfaye D. Felecia Shelling, MD   DATE OF BIRTH:  10-19-1985   DATE OF ADMISSION:  12/18/2005  DATE OF DISCHARGE:  LH                                HISTORY & PHYSICAL   CHIEF COMPLAINT:  Fever and swelling of the left leg.   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  The patient is known to have  chronic swelling of her left lower extremity.  However, in the last 2-3 days  she has started noticing worsening of the swelling, fever and pain.  She  started spiking fever in the range of 100 to 103.  She was brought to the  emergency room where the patient was evaluated and was found to have  cellulitis of the left lower extremity.  The patient was started on IV  antibiotics and she was admitted for further treatment.  The patient had  previously seen __________ and she has been admitted and was treated.   REVIEW OF SYSTEMS:  The patient has headache, fever, chills, nausea but no  vomiting.  No cough, chest pain, shortness of breath, abdominal pain,  dysuria, urgency, or frequency of urination.   PAST MEDICAL HISTORY:  1. Congenital lymphedema of the lower extremity, left greater than the      right.  2. History of recurrent cellulitis of the lower extremities.  3. Anemia.  4. Hypokalemia.   CURRENT MEDICATIONS:  The patient is not on any regular medications.   SOCIAL HISTORY:  The patient is single.  She lives with her mother.  No  history of alcohol, tobacco, or substance abuse.   PHYSICAL EXAMINATION:  GENERAL:  The patient is alert, awake, and sick-  looking  VITAL SIGNS ON ADMISSION:  Blood pressure 87/63, pulse 115, respiratory rate  18,  temperature 103.2 degrees Fahrenheit.  CHEST:  Decreased air entry, bilateral rhonchi.  CARDIOVASCULAR:  First and second heart sounds heard.  No murmur, no gallop.  ABDOMEN:  Soft and lax, bowel sound is positive.  No mass or organomegaly.  EXTREMITIES:  There is diffuse nonpitting swelling of the lower extremities.  The left is bigger than the right.  The patient has severe tenderness on her  left leg.   LABORATORY DATA ON ADMISSION:  Wbc 22.5, hemoglobin 12.7, hematocrit 38.3,  platelets 271.  Sodium 136, potassium 3.7, chloride 103, carbon dioxide is  20, BUN 11, creatinine 1.1, and glucose 110.   ASSESSMENT:  1. Cellulitis of the left lower extremity.  2. Congenital lymphedema.  3. History of anemia.   PLAN:  Will continue the patient on empiric IV antibiotics.  Will continue  blood culture q.24h. if the patient continues to spike.  Will follow culture  results.  Will monitor her CBC and electrolytes.      Tesfaye D.  Felecia Shelling, MD  Electronically Signed     TDF/MEDQ  D:  12/19/2005  T:  12/19/2005  Job:  [id number]

## 2010-09-10 NOTE — H&P (Signed)
[name], [name]           ACCOUNT NO.:  [id number]   MEDICAL RECORD NO.:  [id number]          PATIENT TYPE:  INP   LOCATION:  A330                          FACILITY:  [facility]   PHYSICIAN:  Tesfaye D. Felecia Shelling, MD   DATE OF BIRTH:  12/17/1985   DATE OF ADMISSION:  11/11/2004  DATE OF DISCHARGE:  LH                                HISTORY & PHYSICAL   CHIEF COMPLAINT:  Pain and swelling of the left leg.   HISTORY OF PRESENT ILLNESS:  This is a 25 year old female patient with  history of chronic lymphedema of the lower extremities.  Came to the office  with the above complaints.  The patient was in her usual state of health  until two days back when she started having swelling of the left leg.  It  has continued to get progressively worse.  The patient also developed fever  and pain.  The leg is warm to touch.  She has no history of recent injury.  The patient was previously treated for cellulitis.   REVIEW OF SYSTEMS:  No nausea or vomiting, abdominal pain, dysuria, urgency  or frequency or urination.  No cough, shortness of breath or chest pain.   PAST MEDICAL HISTORY:  1.  Chronic lymphedema of the lower extremities.  2.  Cellulitis of the left leg.   CURRENT MEDICATIONS:  The patient is not on regular medications.   SOCIAL HISTORY:  The patient is single.  She lives with her mother.  No  history of tobacco, alcohol or substance abuse.   PHYSICAL EXAMINATION:  GENERAL:  The patient is alert, awake and sick-  looking.  VITAL SIGNS:  Blood pressure 111/75, pulse 118, respiratory rate 22,  temperature 100.8.  HEENT:  Pupils are equal and reactive.  NECK:  Supple.  CHEST:  Clear lung fields. Good air entry.  CARDIOVASCULAR:  No first or second sound heard.  ABDOMEN:  Obese.  No mass, no organomegaly.  EXTREMITIES:  There is diffuse swelling of the left leg below the knee  involving the ankle area.  It is tender and darkly pigmented.  The leg feels  warm and shiny.   ASSESSMENT:  1.  Cellulitis of the left leg.  2.  History of chronic lymphedema.   PLAN:  Will do baseline studies including CBC, chem-7 and urinalysis.  Will  start the patient on combination of ciprofloxacin and vancomycin since the  patient is allergic to penicillin.  Will do blood cultures.  Will try to  elevate her legs.       TDF/MEDQ  D:  11/11/2004  T:  11/12/2004  Job:  [id number]

## 2010-09-10 NOTE — Discharge Summary (Signed)
[name], [name]           ACCOUNT NO.:  [id number]   MEDICAL RECORD NO.:  [id number]          PATIENT TYPE:  INP   LOCATION:  A417                          FACILITY:  [facility]   PHYSICIAN:  Tesfaye D. Felecia Shelling, MD   DATE OF BIRTH:  Apr 25, 1986   DATE OF ADMISSION:  12/18/2005  DATE OF DISCHARGE:  08/29/2007LH                                 DISCHARGE SUMMARY   DISCHARGE DIAGNOSES:  1. Cellulitis of left leg.  2. Congenital lymphedema of lower extremities.  3. Anemia.   DISCHARGE MEDICATIONS:  Ciprofloxacin 500 mg by mouth twice daily for 7  days.   HOSPITAL COURSE:  This is a 25 year old female patient with a history of  congenital lower extremity lymphedema, was admitted due to cellulitis of the  left leg.  The patient was started on intravenous antibiotics.  She  improved.  She had also hypokalemia which was corrected.  Patient was  discharged to home on oral antibiotics.      Tesfaye D. Felecia Shelling, MD  Electronically Signed     TDF/MEDQ  D:  01/18/2006  T:  01/18/2006  Job:  [id number]

## 2010-09-10 NOTE — Discharge Summary (Signed)
[name], [name]           ACCOUNT NO.:  [id number]   MEDICAL RECORD NO.:  [id number]          PATIENT TYPE:  INP   LOCATION:  A338                          FACILITY:  [facility]   PHYSICIAN:  Tesfaye D. Felecia Shelling, MD   DATE OF BIRTH:  February 24, 1986   DATE OF ADMISSION:  11/11/2004  DATE OF DISCHARGE:  07/24/2006LH                                 DISCHARGE SUMMARY   DISCHARGE DIAGNOSES:  1.  Cellulitis of the left leg.  2.  Chronic lymphedema of the lower extremities.  3.  Hypokalemia.  4.  Anemia.   DISCHARGE MEDICATIONS:  Ciprofloxacin 500 mg p.o. b.i.d. for 10 days.   DISPOSITION:  The patient will be discharged to home.   HOSPITAL COURSE:  This is a 25 year old female patient with a history of  chronic lymphedema who was admitted due to pain and swelling of the left  lower extremity.  The patient was found to have cellulitis of the left leg.  The patient was started on a combination of ciprofloxacin and vancomycin  since she was allergic to penicillin.  The patient improved and had a fever  that subsided.  She had a leukocytosis also that came to normal.  She had a  hypokalemia that was corrected.  The patient was able to ambulate without  assistance.  She is going to be discharged home on oral ciprofloxacin.  The  patient will be followed in the office in two weeks.       TDF/MEDQ  D:  11/15/2004  T:  11/15/2004  Job:  [id number]

## 2010-09-10 NOTE — Discharge Summary (Signed)
   NAME:  [name], [name]                     ACCOUNT NO.:  [id number]   MEDICAL RECORD NO.:  [id number]                   PATIENT TYPE:  NP   LOCATION:  A327                                 FACILITY:  [facility]   PHYSICIAN:  Tesfaye D. Felecia Shelling, M.D.              DATE OF BIRTH:  07-05-1985   DATE OF ADMISSION:  09/07/2001  DATE OF DISCHARGE:  09/12/2001                                 DISCHARGE SUMMARY   DISCHARGE DIAGNOSES:  1. Cellulitis of the left leg.  2. Chronic lymphadenoma of the lower extremities.   DISCHARGE MEDICATIONS:  Levaquin 500 mg p.o. q.d. for seven days.   DISPOSITION:  The patient is discharged home in stable condition.   HOSPITAL COURSE:  This 25 year old female patient, who was admitted due to  nausea, vomiting, and painful swelling of the left lower leg.  She had a  fever in the range of 102.  Her white blood count was 22,000.  The patient  was admitted to the hospital because of cellulitis of the left leg.  She was  started on IV antibiotics.  Blood culture and other septic workup was done.  Blood culture did not grow any bacteria in her blood.  Over the hospital  stay, however, the patient gradually improved with IV antibiotics and she  was discharged to home on oral Levaquin in stable condition.                                               Tesfaye D. Felecia Shelling, M.D.    TDF/MEDQ  D:  11/19/2001  T:  11/25/2001  Job:  [id number]

## 2010-09-10 NOTE — H&P (Signed)
[facility]  Patient:    [name], [name] Visit Number: [id number] MRN: [id number]          Service Type: MED Location: 3A A327 01 Attending Physician:  Avon Gully Dictated by:   Avon Gully, M.D. Admit Date:  09/07/2001                           History and Physical  CHIEF COMPLAINT:  Fever, nausea, vomiting, and swelling of the left leg.  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  The patient was in her usual state of health until two days back when she noticed swelling of her left leg.  Since yesterday the patient developed nausea, vomiting, chills, and fever.  The patient became very weak and unable to keep anything on her stomach.  She was brought to the office where she was evaluated and admitted for further treatment.  REVIEW OF SYSTEMS:  The patient has a headache, fever, chills, and generalized weakness.  She has nausea and vomiting.  No abdominal pain, dysuria, urgency, or frequency of urination.  PAST MEDICAL HISTORY: 1. Chronic lymphedema. 2. Recurrent upper respiratory infection.  CURRENT MEDICATIONS:  The patient is on no regular prescription medications.  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  No history of alcohol, tobacco, or substance abuse.  PHYSICAL EXAMINATION:  GENERAL:  Alert, awake, acutely sick-looking.  VITAL SIGNS:  Blood pressure 110/70, pulse 112 per minute, temperature 101 degrees Fahrenheit, respiratory rate 16 per minute.  HEENT:  Pupils equal and reactive.  NECK:  Supple.  CHEST:  Decreased air entry.  CARDIOVASCULAR:  First and second heart sounds heard.  No murmur, no gallop. Tachycardic.  ABDOMEN:  Obese, soft, and relaxed.  Bowel sounds are positive.  No masses, no organomegaly.  EXTREMITIES:  There is diffuse tenderness and swelling of the left  leg and foot.  There is no area of ulceration.  ADMISSION LABORATORY DATA:  Sodium 135, potassium 3.8, chloride 100, carbon dioxide 28, glucose 124, BUN 7, creatinine 1.1, calcium 8.8.  Total protein 6.8, albumin 3.3, AST 31, SGPT 38, alkaline phosphatase 90, bilirubin 1.  WBC 22.8, hemoglobin 12.0, hematocrit 36, platelets 252.  ASSESSMENT:  This is an 25 year old black female with a history of chronic lymphedema who presented with nausea, vomiting, fever, and swelling and tenderness of the left lower leg.  The patient has leukocytosis and left shift.  The etiology of her current symptoms is not very clear.  However, will rule out cellulitis with possible sepsis.  PLAN:  Will do blood culture, urine culture, and start the patient empirically on Levaquin 500 mg IV piggyback q.d.  Will start her on IV fluids.  Will follow her blood culture results and will try to adjust the antibiotics according to the sensitivity.  Will do a venous Doppler of the left leg to rule out any DVT. Dictated by:   Avon Gully, M.D. Attending Physician:  Avon Gully DD:  09/07/01 TD:  09/09/01 Job: [id number]

## 2010-10-04 ENCOUNTER — Telehealth: Payer: Self-pay | Admitting: Family Medicine

## 2010-10-04 NOTE — Telephone Encounter (Signed)
Nor have I.

## 2010-10-04 NOTE — Telephone Encounter (Signed)
I have not called the patient. 

## 2010-10-08 ENCOUNTER — Encounter: Payer: Self-pay | Admitting: Family Medicine

## 2010-10-12 ENCOUNTER — Encounter: Payer: Self-pay | Admitting: Family Medicine

## 2010-10-12 ENCOUNTER — Ambulatory Visit: Payer: Self-pay | Admitting: Family Medicine

## 2010-12-13 ENCOUNTER — Encounter: Payer: Self-pay | Admitting: Family Medicine

## 2010-12-14 ENCOUNTER — Ambulatory Visit (INDEPENDENT_AMBULATORY_CARE_PROVIDER_SITE_OTHER): Payer: BC Managed Care – PPO | Admitting: Family Medicine

## 2010-12-14 ENCOUNTER — Encounter: Payer: Self-pay | Admitting: Family Medicine

## 2010-12-14 VITALS — BP 118/88 | HR 75 | Resp 16 | Ht 69.0 in | Wt 335.0 lb

## 2010-12-14 DIAGNOSIS — F329 Major depressive disorder, single episode, unspecified: Secondary | ICD-10-CM

## 2010-12-14 DIAGNOSIS — F1990 Other psychoactive substance use, unspecified, uncomplicated: Secondary | ICD-10-CM

## 2010-12-14 DIAGNOSIS — F1910 Other psychoactive substance abuse, uncomplicated: Secondary | ICD-10-CM

## 2010-12-14 DIAGNOSIS — Z139 Encounter for screening, unspecified: Secondary | ICD-10-CM

## 2010-12-14 DIAGNOSIS — R5381 Other malaise: Secondary | ICD-10-CM

## 2010-12-14 DIAGNOSIS — Z13220 Encounter for screening for lipoid disorders: Secondary | ICD-10-CM

## 2010-12-14 DIAGNOSIS — E282 Polycystic ovarian syndrome: Secondary | ICD-10-CM

## 2010-12-14 DIAGNOSIS — E559 Vitamin D deficiency, unspecified: Secondary | ICD-10-CM

## 2010-12-14 DIAGNOSIS — F32A Depression, unspecified: Secondary | ICD-10-CM

## 2010-12-14 DIAGNOSIS — D649 Anemia, unspecified: Secondary | ICD-10-CM

## 2010-12-14 DIAGNOSIS — I890 Lymphedema, not elsewhere classified: Secondary | ICD-10-CM

## 2010-12-14 DIAGNOSIS — R5383 Other fatigue: Secondary | ICD-10-CM

## 2010-12-14 DIAGNOSIS — N912 Amenorrhea, unspecified: Secondary | ICD-10-CM

## 2010-12-14 DIAGNOSIS — E66813 Obesity, class 3: Secondary | ICD-10-CM

## 2010-12-14 LAB — LIPID PANEL
Cholesterol: 203 mg/dL — ABNORMAL HIGH (ref 0–200)
Total CHOL/HDL Ratio: 6.2 Ratio
Triglycerides: 278 mg/dL — ABNORMAL HIGH (ref <150)
VLDL: 56 mg/dL — ABNORMAL HIGH (ref 0–40)

## 2010-12-14 LAB — FERRITIN: Ferritin: 160 ng/mL (ref 10–291)

## 2010-12-14 LAB — IRON AND TIBC: %SAT: 28 % (ref 20–55)

## 2010-12-14 MED ORDER — NORGESTIM-ETH ESTRAD TRIPHASIC 0.18/0.215/0.25 MG-25 MCG PO TABS: 1.0000 | ORAL_TABLET | Freq: Every day | ORAL | Status: DC

## 2010-12-14 NOTE — Patient Instructions (Addendum)
CPE in 2 months.  It is important that you exercise regularly at least 30 minutes 5 times a week. If you develop chest pain, have severe difficulty breathing, or feel very tired, stop exercising immediately and seek medical attention   A healthy diet is rich in fruit, vegetables and whole grains. Poultry fish, nuts and beans are a healthy choice for protein rather then red meat. A low sodium diet and drinking 64 ounces of water daily is generally recommended. Oils and sweet should be limited. Carbohydrates especially for those who are diabetic or overweight, should be limited to 34-45 gram per meal. It is important to eat on a regular schedule, at least 3 times daily. Snacks should be primarily fruits, vegetables or nuts.   Fasting labs   Today  I recommend you start birth control pills on the first day of your next cycle, will send  In script   Condoms should be used to prevent pregnancy and reduce STD transmission  Pls call [facility] for an appt, also you need to stop alcohol, and illicit drug use.  You are referred to vascular surgery  Once you are on depression  med I will see  If you can start an appetite suppressant I will provide a 1500 cal diet, you will also be referred to dietitian

## 2010-12-15 DIAGNOSIS — E559 Vitamin D deficiency, unspecified: Secondary | ICD-10-CM | POA: Insufficient documentation

## 2010-12-15 LAB — VITAMIN D 25 HYDROXY (VIT D DEFICIENCY, FRACTURES): Vit D, 25-Hydroxy: 22 ng/mL — ABNORMAL LOW (ref 30–89)

## 2010-12-20 MED ORDER — VITAMIN D3 1.25 MG (50000 UT) PO CAPS: 50000.0000 [IU] | ORAL_CAPSULE | ORAL | Status: DC

## 2010-12-21 DIAGNOSIS — F329 Major depressive disorder, single episode, unspecified: Secondary | ICD-10-CM | POA: Insufficient documentation

## 2010-12-21 DIAGNOSIS — F1990 Other psychoactive substance use, unspecified, uncomplicated: Secondary | ICD-10-CM | POA: Insufficient documentation

## 2010-12-21 NOTE — Assessment & Plan Note (Signed)
Chronic lymphedema, pt referred to vascular surgery for eval

## 2010-12-21 NOTE — Assessment & Plan Note (Signed)
Pt to start weekly vitamin d based on lab

## 2010-12-21 NOTE — Assessment & Plan Note (Signed)
Deteriorated. Patient re-educated about  the importance of commitment to a  minimum of 150 minutes of exercise per week. The importance of healthy food choices with portion control discussed. Encouraged to start a food diary, count calories and to consider  joining a support group. Sample diet sheets offered. Goals set by the patient for the next several months.    

## 2010-12-27 NOTE — Assessment & Plan Note (Signed)
Deteriorated, pt to resume therapy and treatment through mental health

## 2010-12-27 NOTE — Assessment & Plan Note (Signed)
Reports illicit drug use, counseled to quit

## 2010-12-27 NOTE — Progress Notes (Signed)
  Subjective:    Patient ID: [name], female    DOB: 24-Nov-1985, 25 y.o.   MRN: [id number]  HPI The PT is here fto re-establish care  And for  re-evaluation of chronic medical conditions, medication management and review of any available recent lab and radiology data.  Preventive health is updated, specifically  Cancer screening and Immunization.   She is concerned about her lymphedema, and is hopeful that evaluation in a neighboring clinic will be helpful.She has been to [facility] in the past, but hopes to not have to travel this far. The swollen left leg is a source of mental stress and depression for her. She also reports increased  Alcohol and marijuana use to deal with depression, she wants to get back into therapy and treatment, and will call to re-establish care , last seen in March. She is not suicidal or homicidal, and denies hallucinations. She is concerned about her weight , but has not been actively involved in lifestyle changes to facilitate this. She is requesting an appetite suppressant, but behavior modification is what is stressed at this time, based on her mental health     Review of Systems See HPI Denies recent fever or chills. Denies sinus pressure, nasal congestion, ear pain or sore throat. Denies chest congestion, productive cough or wheezing. Denies chest pains, palpitations Denies abdominal pain, nausea, vomiting,diarrhea or constipation.   Denies dysuria, frequency, hesitancy or incontinence. Denies joint pain, swelling and limitation in mobility. Denies headaches, seizures, numbness, or tingling.  Denies skin break down or rash.        Objective:   Physical Exam Patient alert and oriented and in no cardiopulmonary distress.Morbidly obese  HEENT: No facial asymmetry, EOMI, no sinus tenderness,  oropharynx pink and moist.  Neck supple no adenopathy.  Chest: Clear to auscultation bilaterally.  CVS: S1, S2 no murmurs, no S3.  ABD: Soft non tender.  Bowel sounds normal.  Ext: lymphedema of the left lower extremity  MS: Adequate ROM spine, shoulders, hips and knees.  Skin: Intact, no ulcerations or rash noted.  Psych: Good eye contact,flat  affect. Memory intact,  depressed appearing.  CNS: CN 2-12 intact, power, tone and sensation normal throughout.        Assessment & Plan:

## 2011-02-01 ENCOUNTER — Encounter: Payer: Self-pay | Admitting: Family Medicine

## 2011-02-03 ENCOUNTER — Encounter: Payer: Self-pay | Admitting: Family Medicine

## 2011-02-03 ENCOUNTER — Ambulatory Visit (INDEPENDENT_AMBULATORY_CARE_PROVIDER_SITE_OTHER): Payer: BC Managed Care – PPO | Admitting: Family Medicine

## 2011-02-03 VITALS — BP 110/70 | HR 84 | Resp 16 | Ht 69.0 in | Wt 330.4 lb

## 2011-02-03 DIAGNOSIS — Z0000 Encounter for general adult medical examination without abnormal findings: Secondary | ICD-10-CM

## 2011-02-03 DIAGNOSIS — F1910 Other psychoactive substance abuse, uncomplicated: Secondary | ICD-10-CM

## 2011-02-03 DIAGNOSIS — E559 Vitamin D deficiency, unspecified: Secondary | ICD-10-CM

## 2011-02-03 DIAGNOSIS — F329 Major depressive disorder, single episode, unspecified: Secondary | ICD-10-CM

## 2011-02-03 DIAGNOSIS — I890 Lymphedema, not elsewhere classified: Secondary | ICD-10-CM

## 2011-02-03 DIAGNOSIS — Z23 Encounter for immunization: Secondary | ICD-10-CM

## 2011-02-03 DIAGNOSIS — F1990 Other psychoactive substance use, unspecified, uncomplicated: Secondary | ICD-10-CM

## 2011-02-03 NOTE — Patient Instructions (Addendum)
F/u in 4 months  It is important that you exercise regularly at least 30 minutes 5 times a week. If you develop chest pain, have severe difficulty breathing, or feel very tired, stop exercising immediately and seek medical attention   A healthy diet is rich in fruit, vegetables and whole grains. Poultry fish, nuts and beans are a healthy choice for protein rather then red meat. A low sodium diet and drinking 64 ounces of water daily is generally recommended. Oils and sweet should be limited. Carbohydrates especially for those who are diabetic or overweight, should be limited to 30-45 gram per meal. It is important to eat on a regular schedule, at least 3 times daily. Snacks should be primarily fruits, vegetables or nuts.  Weight loss goal of 3 to 4 pounds per month at least.  Pls try and get appt at mental health with a psychatrist TdaP and flu vaccine today  You need to follow a low fat diet.cholesterol is high  Start vit D supplement 800IU once daily, your level is low this is over the counter

## 2011-02-06 NOTE — Assessment & Plan Note (Signed)
Pt to start weekly vit D

## 2011-02-06 NOTE — Assessment & Plan Note (Signed)
Reports less alcohol use, encouraged to be involved in therapy and to quit

## 2011-02-06 NOTE — Assessment & Plan Note (Signed)
Improved. Pt applauded on succesful weight loss through lifestyle change, and encouraged to continue same. Weight loss goal set for the next several months.  

## 2011-02-06 NOTE — Assessment & Plan Note (Signed)
Unchanged, pt to wrap lower extremity, has had recent vascular eval, no new management strategy. Handicap sticker written

## 2011-02-06 NOTE — Assessment & Plan Note (Signed)
Slightly improved, on prozac, needs more active involvement in psych treatment program

## 2011-02-06 NOTE — Progress Notes (Signed)
  Subjective:    Patient ID: [name], female    DOB: 1985/10/12, 25 y.o.   MRN: [id number]  HPI The PT is here for  Annual exam and re-evaluation of chronic medical conditions, medication management and review of any available recent lab and radiology data.  Preventive health is updated, specifically  Cancer screening and Immunization.   Questions or concerns regarding consultations or procedures which the PT has had in the interim are  Addressed.She has been back to mental health group therapy , as well as to vascular surgery for lymphedema of left lower extremity. Not enthused about group therapy and still needs session with psychiatrist, has very low self esteem. Her lower extremity, left is wrapped, states surgery was spoken of but she has no interest The PT denies any adverse reactions to current medications since the last visit.  Currently having heavy menstrural flow when pelvic was attempted so will return for pap to be sent. Tolerating OCP with no problem. Requested handicap parking sticker which was granted due to marked lymphedema     Review of Systems See HPI Denies recent fever or chills. Denies sinus pressure, nasal congestion, ear pain or sore throat. Denies chest congestion, productive cough or wheezing. Denies chest pains, palpitations and leg swelling Denies abdominal pain, nausea, vomiting,diarrhea or constipation.   Denies dysuria, frequency, hesitancy or incontinence. C/o left lower swelling with  limitation in mobility. Denies headaches, seizures, numbness, or tingling. C/o inadequately treated depression,not actively suicidal or homicdal, but clearly in need of more intense therapy through mental health Denies skin break down or rash.        Objective:   Physical Exam .Pleasant well nourished female, alert and oriented x 3, in no cardio-pulmonary distress. Afebrile. HEENT No facial trauma or asymetry. Sinuses non tender.  EOMI, PERTL.  External  ears normal, tympanic membranes clear. Oropharynx moist, no exudate, fair  dentition. Neck: supple, no adenopathy,JVD or thyromegaly.No bruits.  Chest: Clear to ascultation bilaterally.No crackles or wheezes. Non tender to palpation  Breast: No asymetry,no masses. No nipple discharge or inversion. No axillary or supraclavicular adenopathy  Cardiovascular system; Heart sounds normal,  S1 and  S2 ,no S3.  No murmur, or thrill. Apical beat not displaced Peripheral pulses normal.  Abdomen: Soft, non tender, no organomegaly or masses. No bruits. Bowel sounds normal. No guarding, tenderness or rebound.   GU: External genitalia normal. No lesions. Excessive vaginal bleeding , pt to return for pap.  Musculoskeletal exam: Decreased  ROM of spine,  shoulders and knees. Marked left lower extremity lymphededma, which limits mobility.  Neurologic: Cranial nerves 2 to 12 intact. Power, tone ,sensation and reflexes normal throughout. No disturbance in gait. No tremor.  Skin: Intact, no ulceration, erythema , scaling or rash noted. Pigmentation normal throughout  Psych; Normal mood and affect. Judgement and concentration normal        Assessment & Plan:

## 2011-02-14 ENCOUNTER — Telehealth: Payer: Self-pay | Admitting: Family Medicine

## 2011-02-18 NOTE — Telephone Encounter (Signed)
Already gave dr the fax

## 2011-03-03 ENCOUNTER — Encounter: Payer: BC Managed Care – PPO | Admitting: Family Medicine

## 2011-03-03 ENCOUNTER — Telehealth: Payer: Self-pay | Admitting: Family Medicine

## 2011-03-04 NOTE — Telephone Encounter (Signed)
What do I need to tell them?

## 2011-03-08 ENCOUNTER — Telehealth: Payer: Self-pay

## 2011-03-08 NOTE — Telephone Encounter (Signed)
States they have been calling trying to get a pump for patient and they have heard nothing. I know the patient was wanting this but what do I need to tell this company because nobody has contacted them regarding this. PH [phone] Fax [phone]

## 2011-03-09 NOTE — Telephone Encounter (Signed)
Spoke with [name] he fax the papers in the morning then i will give them to you

## 2011-03-09 NOTE — Telephone Encounter (Signed)
If we still have the original papers which I sent to Dr Tanda Rockers, thinking he would sign off,pls may i have them, if not pls ask company to resend the papers.  I spoke with the therapist who works with pt and will sign off on the papers

## 2011-03-10 NOTE — Telephone Encounter (Signed)
The papers are in your box

## 2011-03-11 NOTE — Telephone Encounter (Signed)
pls fax cover letter and the other 2 papers once I sign the cover letter, thanks

## 2011-03-28 ENCOUNTER — Other Ambulatory Visit (HOSPITAL_COMMUNITY)
Admission: RE | Admit: 2011-03-28 | Discharge: 2011-03-28 | Disposition: A | Discharge status: Home / Self Care | Payer: BC Managed Care – PPO | Source: Ambulatory Visit | Attending: Family Medicine | Admitting: Family Medicine

## 2011-03-28 DIAGNOSIS — Z01419 Encounter for gynecological examination (general) (routine) without abnormal findings: Secondary | ICD-10-CM | POA: Insufficient documentation

## 2011-03-30 ENCOUNTER — Encounter: Payer: Self-pay | Admitting: Family Medicine

## 2011-03-31 ENCOUNTER — Encounter: Payer: Self-pay | Admitting: Family Medicine

## 2011-03-31 ENCOUNTER — Ambulatory Visit (INDEPENDENT_AMBULATORY_CARE_PROVIDER_SITE_OTHER): Payer: BC Managed Care – PPO | Admitting: Family Medicine

## 2011-03-31 VITALS — BP 128/78 | HR 92 | Resp 18 | Ht 69.0 in | Wt 337.0 lb

## 2011-03-31 DIAGNOSIS — Z01419 Encounter for gynecological examination (general) (routine) without abnormal findings: Secondary | ICD-10-CM

## 2011-03-31 DIAGNOSIS — L309 Dermatitis, unspecified: Secondary | ICD-10-CM | POA: Insufficient documentation

## 2011-03-31 DIAGNOSIS — L259 Unspecified contact dermatitis, unspecified cause: Secondary | ICD-10-CM

## 2011-03-31 MED ORDER — BETAMETHASONE VALERATE 0.1 % EX CREA: TOPICAL_CREAM | Freq: Two times a day (BID) | CUTANEOUS | Status: DC

## 2011-03-31 NOTE — Patient Instructions (Signed)
F/u in 4.5 months.  It is vital that you cut back on calories so you lose weight and improve your health.  I am happy that you have your pump.  Medication is sent in for eczema.  Weight loss goal of 3 pounds per month

## 2011-03-31 NOTE — Progress Notes (Signed)
  Subjective:    Patient ID: [name], female    DOB: May 27, 1985, 25 y.o.   MRN: [id number]  HPI Pt in for gynae exam only. At her last visit for a CPE she was bleeding heavily, hence her visit today to complete this Also c/o increased eczema requests med for this Has received the vacuum pump for left leg edema and is very happy about this   Review of Systems See HPI Denies recent fever or chills. Denies sinus pressure, nasal congestion, ear pain or sore throat. Denies chest congestion, productive cough or wheezing. Denies chest pains, palpitations and leg swelling  Denies depression, anxiety or insomnia. Denies skin break down or rash.        Objective:   Physical Exam  Gynae: Uterus normal size, unable to comment on ovaries /adnexa due to morbid obesity. Cervix appears healthy, no vaginal d/c noted Skin: hypopigmented nummular eczema      Assessment & Plan:

## 2011-04-01 NOTE — Assessment & Plan Note (Signed)
Unchnaged. Patient re-educated about  the importance of commitment to a  minimum of 150 minutes of exercise per week. The importance of healthy food choices with portion control discussed. Encouraged to start a food diary, count calories and to consider  joining a support group. Sample diet sheets offered. Goals set by the patient for the next several months.    

## 2011-04-01 NOTE — Assessment & Plan Note (Signed)
Current flare med prescribed

## 2011-04-11 ENCOUNTER — Telehealth: Payer: Self-pay | Admitting: Family Medicine

## 2011-04-11 ENCOUNTER — Emergency Department (HOSPITAL_COMMUNITY)
Admission: EM | Admit: 2011-04-11 | Discharge: 2011-04-11 | Disposition: A | Discharge status: Home / Self Care | Payer: BC Managed Care – PPO | Attending: Emergency Medicine | Admitting: Emergency Medicine

## 2011-04-11 ENCOUNTER — Encounter (HOSPITAL_COMMUNITY): Payer: Self-pay | Admitting: Emergency Medicine

## 2011-04-11 DIAGNOSIS — B349 Viral infection, unspecified: Secondary | ICD-10-CM

## 2011-04-11 DIAGNOSIS — J3489 Other specified disorders of nose and nasal sinuses: Secondary | ICD-10-CM | POA: Insufficient documentation

## 2011-04-11 DIAGNOSIS — IMO0001 Reserved for inherently not codable concepts without codable children: Secondary | ICD-10-CM | POA: Insufficient documentation

## 2011-04-11 DIAGNOSIS — R6889 Other general symptoms and signs: Secondary | ICD-10-CM | POA: Insufficient documentation

## 2011-04-11 DIAGNOSIS — R05 Cough: Secondary | ICD-10-CM | POA: Insufficient documentation

## 2011-04-11 DIAGNOSIS — R6883 Chills (without fever): Secondary | ICD-10-CM | POA: Insufficient documentation

## 2011-04-11 DIAGNOSIS — R059 Cough, unspecified: Secondary | ICD-10-CM | POA: Insufficient documentation

## 2011-04-11 DIAGNOSIS — B9789 Other viral agents as the cause of diseases classified elsewhere: Secondary | ICD-10-CM | POA: Insufficient documentation

## 2011-04-11 MED ORDER — HYDROCOD POLST-CHLORPHEN POLST 10-8 MG/5ML PO LQCR: 5.0000 mL | Freq: Two times a day (BID) | ORAL | Status: DC | PRN

## 2011-04-11 MED ORDER — IBUPROFEN 800 MG PO TABS: 800.0000 mg | ORAL_TABLET | Freq: Three times a day (TID) | ORAL | Status: AC

## 2011-04-11 MED ORDER — OSELTAMIVIR PHOSPHATE 75 MG PO CAPS: 75.0000 mg | ORAL_CAPSULE | Freq: Two times a day (BID) | ORAL | Status: AC
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
## 2011-04-11 NOTE — Telephone Encounter (Signed)
Advised patient to go to the urgent care

## 2011-04-11 NOTE — ED Notes (Signed)
Pt woke up this am with chills,body aches and cough.

## 2011-04-15 NOTE — ED Provider Notes (Signed)
Evaluation and management procedures were performed by the PA/NP under my supervision/collaboration.    Citlaly Camplin D Breeann Reposa, MD 04/15/11 1516 

## 2011-08-11 ENCOUNTER — Ambulatory Visit: Payer: BC Managed Care – PPO | Admitting: Family Medicine

## 2011-08-11 ENCOUNTER — Encounter: Payer: Self-pay | Admitting: Family Medicine

## 2011-11-05 ENCOUNTER — Encounter (HOSPITAL_COMMUNITY): Payer: Self-pay

## 2011-11-05 ENCOUNTER — Inpatient Hospital Stay (HOSPITAL_COMMUNITY)
Admission: EM | Admit: 2011-11-05 | Discharge: 2011-11-08 | DRG: 603 | Disposition: A | Discharge status: Home / Self Care | Payer: Medicare Other | Attending: Internal Medicine | Admitting: Internal Medicine

## 2011-11-05 ENCOUNTER — Inpatient Hospital Stay (HOSPITAL_COMMUNITY): Payer: Medicare Other

## 2011-11-05 DIAGNOSIS — R5381 Other malaise: Secondary | ICD-10-CM

## 2011-11-05 DIAGNOSIS — D649 Anemia, unspecified: Secondary | ICD-10-CM | POA: Diagnosis present

## 2011-11-05 DIAGNOSIS — L02419 Cutaneous abscess of limb, unspecified: Principal | ICD-10-CM | POA: Diagnosis present

## 2011-11-05 DIAGNOSIS — Z6841 Body Mass Index (BMI) 40.0 and over, adult: Secondary | ICD-10-CM

## 2011-11-05 DIAGNOSIS — Z881 Allergy status to other antibiotic agents status: Secondary | ICD-10-CM

## 2011-11-05 DIAGNOSIS — E876 Hypokalemia: Secondary | ICD-10-CM | POA: Diagnosis present

## 2011-11-05 DIAGNOSIS — F32A Depression, unspecified: Secondary | ICD-10-CM

## 2011-11-05 DIAGNOSIS — R059 Cough, unspecified: Secondary | ICD-10-CM | POA: Diagnosis present

## 2011-11-05 DIAGNOSIS — R000 Tachycardia, unspecified: Secondary | ICD-10-CM | POA: Diagnosis present

## 2011-11-05 DIAGNOSIS — R05 Cough: Secondary | ICD-10-CM | POA: Diagnosis present

## 2011-11-05 DIAGNOSIS — F17200 Nicotine dependence, unspecified, uncomplicated: Secondary | ICD-10-CM | POA: Diagnosis present

## 2011-11-05 DIAGNOSIS — I959 Hypotension, unspecified: Secondary | ICD-10-CM | POA: Diagnosis present

## 2011-11-05 DIAGNOSIS — F1990 Other psychoactive substance use, unspecified, uncomplicated: Secondary | ICD-10-CM

## 2011-11-05 DIAGNOSIS — L03119 Cellulitis of unspecified part of limb: Secondary | ICD-10-CM | POA: Diagnosis not present

## 2011-11-05 DIAGNOSIS — E871 Hypo-osmolality and hyponatremia: Secondary | ICD-10-CM | POA: Diagnosis not present

## 2011-11-05 DIAGNOSIS — F329 Major depressive disorder, single episode, unspecified: Secondary | ICD-10-CM

## 2011-11-05 DIAGNOSIS — L0291 Cutaneous abscess, unspecified: Secondary | ICD-10-CM

## 2011-11-05 DIAGNOSIS — I890 Lymphedema, not elsewhere classified: Secondary | ICD-10-CM | POA: Diagnosis not present

## 2011-11-05 DIAGNOSIS — N179 Acute kidney failure, unspecified: Secondary | ICD-10-CM | POA: Diagnosis present

## 2011-11-05 DIAGNOSIS — L0390 Cellulitis, unspecified: Secondary | ICD-10-CM

## 2011-11-05 DIAGNOSIS — L309 Dermatitis, unspecified: Secondary | ICD-10-CM

## 2011-11-05 DIAGNOSIS — J309 Allergic rhinitis, unspecified: Secondary | ICD-10-CM

## 2011-11-05 HISTORY — DX: Anemia, unspecified: D64.9

## 2011-11-05 LAB — CBC WITH DIFFERENTIAL/PLATELET
Basophils Absolute: 0 10*3/uL (ref 0.0–0.1)
Eosinophils Absolute: 0 10*3/uL (ref 0.0–0.7)
Eosinophils Relative: 0 % (ref 0–5)
Lymphocytes Relative: 9 % — ABNORMAL LOW (ref 12–46)
MCV: 81.3 fL (ref 78.0–100.0)
Neutrophils Relative %: 89 % — ABNORMAL HIGH (ref 43–77)
Platelets: 252 10*3/uL (ref 150–400)
RBC: 4.28 MIL/uL (ref 3.87–5.11)
RDW: 14.8 % (ref 11.5–15.5)
WBC: 18.8 10*3/uL — ABNORMAL HIGH (ref 4.0–10.5)

## 2011-11-05 LAB — BASIC METABOLIC PANEL
CO2: 25 mEq/L (ref 19–32)
Calcium: 8.9 mg/dL (ref 8.4–10.5)
GFR calc non Af Amer: 58 mL/min — ABNORMAL LOW (ref >90)
Potassium: 3.1 mEq/L — ABNORMAL LOW (ref 3.5–5.1)
Sodium: 132 mEq/L — ABNORMAL LOW (ref 135–145)

## 2011-11-05 MED ORDER — ACETAMINOPHEN 325 MG PO TABS
650.0000 mg | ORAL_TABLET | Freq: Four times a day (QID) | ORAL | Status: DC | PRN
  Administered 2011-11-06: 650 mg via ORAL
  Filled 2011-11-05: qty 2

## 2011-11-05 MED ORDER — ALUM & MAG HYDROXIDE-SIMETH 200-200-20 MG/5ML PO SUSP: 30.0000 mL | Freq: Four times a day (QID) | ORAL | Status: DC | PRN

## 2011-11-05 MED ORDER — ONDANSETRON HCL 4 MG/2ML IJ SOLN
4.0000 mg | Freq: Once | INTRAMUSCULAR | Status: AC
  Administered 2011-11-05: 4 mg via INTRAVENOUS
  Filled 2011-11-05: qty 2

## 2011-11-05 MED ORDER — ONDANSETRON HCL 4 MG PO TABS
4.0000 mg | ORAL_TABLET | Freq: Four times a day (QID) | ORAL | Status: DC | PRN
  Administered 2011-11-06: 4 mg via ORAL
  Filled 2011-11-05: qty 1

## 2011-11-05 MED ORDER — POTASSIUM CHLORIDE IN NACL 40-0.9 MEQ/L-% IV SOLN
INTRAVENOUS | Status: DC
  Administered 2011-11-05 – 2011-11-06 (×2): via INTRAVENOUS
  Filled 2011-11-05 (×6): qty 1000

## 2011-11-05 MED ORDER — CEFTAROLINE FOSAMIL 600 MG IV SOLR
600.0000 mg | Freq: Two times a day (BID) | INTRAVENOUS | Status: DC
  Administered 2011-11-05 – 2011-11-08 (×6): 600 mg via INTRAVENOUS
  Filled 2011-11-05 (×8): qty 600

## 2011-11-05 MED ORDER — MOXIFLOXACIN HCL IN NACL 400 MG/250ML IV SOLN
400.0000 mg | Freq: Once | INTRAVENOUS | Status: AC
  Administered 2011-11-05: 400 mg via INTRAVENOUS
  Filled 2011-11-05: qty 250

## 2011-11-05 MED ORDER — HYDROMORPHONE HCL PF 1 MG/ML IJ SOLN
0.5000 mg | INTRAMUSCULAR | Status: DC | PRN
  Administered 2011-11-06 (×2): 0.5 mg via INTRAVENOUS
  Filled 2011-11-05 (×2): qty 1

## 2011-11-05 MED ORDER — MORPHINE SULFATE 4 MG/ML IJ SOLN
4.0000 mg | Freq: Once | INTRAMUSCULAR | Status: AC
  Administered 2011-11-05: 2 mg via INTRAVENOUS
  Filled 2011-11-05: qty 1

## 2011-11-05 MED ORDER — ACETAMINOPHEN 650 MG RE SUPP: 650.0000 mg | Freq: Four times a day (QID) | RECTAL | Status: DC | PRN

## 2011-11-05 MED ORDER — ACETAMINOPHEN 325 MG PO TABS
650.0000 mg | ORAL_TABLET | Freq: Once | ORAL | Status: AC
  Administered 2011-11-05: 650 mg via ORAL
  Filled 2011-11-05: qty 2

## 2011-11-05 MED ORDER — SODIUM CHLORIDE 0.9 % IV SOLN: 1000.0000 mL | INTRAVENOUS | Status: DC

## 2011-11-05 MED ORDER — HEPARIN SODIUM (PORCINE) 5000 UNIT/ML IJ SOLN
5000.0000 [IU] | Freq: Three times a day (TID) | INTRAMUSCULAR | Status: DC
  Administered 2011-11-05 – 2011-11-08 (×8): 5000 [IU] via SUBCUTANEOUS
  Filled 2011-11-05 (×8): qty 1

## 2011-11-05 MED ORDER — ONDANSETRON HCL 4 MG/2ML IJ SOLN
4.0000 mg | Freq: Four times a day (QID) | INTRAMUSCULAR | Status: DC | PRN
  Administered 2011-11-06: 4 mg via INTRAVENOUS
  Filled 2011-11-05 (×2): qty 2

## 2011-11-05 MED ORDER — CEFTAROLINE FOSAMIL 600 MG IV SOLR
INTRAVENOUS | Status: AC
  Filled 2011-11-05: qty 600

## 2011-11-05 MED ORDER — POTASSIUM CHLORIDE IN NACL 40-0.9 MEQ/L-% IV SOLN
INTRAVENOUS | Status: AC
  Filled 2011-11-05: qty 3000

## 2011-11-05 MED ORDER — SODIUM CHLORIDE 0.9 % IV SOLN
1000.0000 mL | Freq: Once | INTRAVENOUS | Status: AC
  Administered 2011-11-05: 1000 mL via INTRAVENOUS

## 2011-11-05 MED ORDER — IBUPROFEN 800 MG PO TABS
400.0000 mg | ORAL_TABLET | Freq: Four times a day (QID) | ORAL | Status: DC | PRN
  Administered 2011-11-05 – 2011-11-07 (×3): 400 mg via ORAL
  Filled 2011-11-05 (×3): qty 1

## 2011-11-05 MED ORDER — MAGNESIUM HYDROXIDE 400 MG/5ML PO SUSP: 30.0000 mL | Freq: Every day | ORAL | Status: DC | PRN

## 2011-11-05 NOTE — ED Notes (Signed)
Patient only given Morphine 2mg , IV per patient request. Patient states that she didn't want anything to strong. Other 2mg  of morphine wasted in sink with Tonita Phoenix, RN.
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
## 2011-11-05 NOTE — ED Notes (Signed)
Pt has chronic left lower leg swelling, thinks she haas been running a fever and leg is more swollen since yesterday
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
## 2011-11-05 NOTE — ED Notes (Signed)
Family given sprite.  Patient resting comfortably.

## 2011-11-06 DIAGNOSIS — L02419 Cutaneous abscess of limb, unspecified: Principal | ICD-10-CM

## 2011-11-06 LAB — BASIC METABOLIC PANEL
BUN: 9 mg/dL (ref 6–23)
Chloride: 105 mEq/L (ref 96–112)
GFR calc Af Amer: 74 mL/min — ABNORMAL LOW (ref >90)
Potassium: 4.2 mEq/L (ref 3.5–5.1)
Sodium: 140 mEq/L (ref 135–145)

## 2011-11-06 LAB — CBC
HCT: 32.2 % — ABNORMAL LOW (ref 36.0–46.0)
Hemoglobin: 10.3 g/dL — ABNORMAL LOW (ref 12.0–15.0)
RBC: 3.92 MIL/uL (ref 3.87–5.11)
WBC: 13.1 10*3/uL — ABNORMAL HIGH (ref 4.0–10.5)

## 2011-11-06 MED ORDER — CEFTAROLINE FOSAMIL 600 MG IV SOLR
INTRAVENOUS | Status: AC
  Filled 2011-11-06: qty 600

## 2011-11-06 MED ORDER — MORPHINE SULFATE 2 MG/ML IJ SOLN: 1.0000 mg | INTRAMUSCULAR | Status: DC | PRN

## 2011-11-06 NOTE — Progress Notes (Signed)
Subjective: Still with some pain. Pain medication helping.  Objective: Vital signs in last 24 hours: Filed Vitals:   11/05/11 2138 11/06/11 0217 11/06/11 0459 11/06/11 0953  BP: 87/56 101/70 103/69 96/63  Pulse: 93 98 90 96  Temp: 98.2 F (36.8 C) 98.8 F (37.1 C) 99.2 F (37.3 C) 98.3 F (36.8 C)  TempSrc: Oral Oral Oral Oral  Resp: 20 20 20 20   Height:      Weight:   157.716 kg (347 lb 11.2 oz)   SpO2: 94% 99% 97% 96%   Weight change:   Intake/Output Summary (Last 24 hours) at 11/06/11 1238 Last data filed at 11/06/11 0800  Gross per 24 hour  Intake 2244.17 ml  Output      0 ml  Net 2244.17 ml   Gen.: More alert. Nontoxic appearing Lungs clear to auscultation bilaterally without wheeze rhonchi or rales Cardiovascular regular rate rhythm without murmurs gallops rubs Abdomen soft nontender nondistended Extremities left leg still warm and edematous. Slightly improved proximally compared to yesterday  Lab Results: Basic Metabolic Panel:  Lab 11/06/11 1610 11/05/11 1324  NA 140 132*  K 4.2 3.1*  CL 105 95*  CO2 27 25  GLUCOSE 107* 126*  BUN 9 11  CREATININE 1.15* 1.25*  CALCIUM 8.6 8.9  MG -- --  PHOS -- --   Liver Function Tests: No results found for this basename: AST:2,ALT:2,ALKPHOS:2,BILITOT:2,PROT:2,ALBUMIN:2 in the last 168 hours No results found for this basename: LIPASE:2,AMYLASE:2 in the last 168 hours No results found for this basename: AMMONIA:2 in the last 168 hours CBC:  Lab 11/06/11 0635 11/05/11 1324  WBC 13.1* 18.8*  NEUTROABS -- 16.6*  HGB 10.3* 11.2*  HCT 32.2* 34.8*  MCV 82.1 81.3  PLT 204 252    Micro Results: No results found for this or any previous visit (from the past 240 hour(s)). Studies/Results: Dg Chest Portable 1 View  11/05/2011  *RADIOLOGY REPORT*  Clinical Data: Shortness breath.  Weakness.  Lower extremity swelling.  PORTABLE CHEST - 1 VIEW  Comparison: 09/02/2010  Findings: Very low lung volumes are seen, however both  lungs remain grossly clear.  Heart size is normal.  IMPRESSION: Very low lung volumes.  No acute findings.  Original Report Authenticated By: Danae Orleans, M.D.   Scheduled Meds:   . sodium chloride  1,000 mL Intravenous Once  . acetaminophen  650 mg Oral Once  . ceftaroline (TEFLARO) 600 mg IVPB  600 mg Intravenous Q12H  . heparin  5,000 Units Subcutaneous Q8H  .  morphine injection  4 mg Intravenous Once  . moxifloxacin  400 mg Intravenous Once  . ondansetron  4 mg Intravenous Once   Continuous Infusions:   . DISCONTD: sodium chloride    . DISCONTD: 0.9 % NaCl with KCl 40 mEq / L 125 mL/hr at 11/06/11 0428   PRN Meds:.acetaminophen, acetaminophen, alum & mag hydroxide-simeth, HYDROmorphone (DILAUDID) injection, ibuprofen, magnesium hydroxide, ondansetron (ZOFRAN) IV, ondansetron Assessment/Plan: Principal Problem:  *Cellulitis and abscess of leg and white blood cell count improved. Continue ceftaroline. Hep-Lock IV fluids. Active Problems:  LYMPHEDEMA  Hypokalemia resolved  Cough chest x-ray normal.  Morbid obesity  Hyponatremia, resolved   LOS: 1 day   Alex Leahy L 11/06/2011, 12:38 PM

## 2011-11-07 MED ORDER — SODIUM CHLORIDE 0.9 % IJ SOLN
INTRAMUSCULAR | Status: AC
  Filled 2011-11-07: qty 6

## 2011-11-07 NOTE — Progress Notes (Signed)
Subjective: Leg pain improved somewhat. Has not taken any morphine or Dilaudid, because it has caused vomiting. No emesis today. No nausea. She's complaining of a headache. She's walked to the bathroom and back but not in the hallways.  Objective: Vital signs in last 24 hours: Filed Vitals:   11/06/11 2204 11/07/11 0217 11/07/11 0443 11/07/11 1413  BP: 91/57 97/66 94/71  104/65  Pulse: 93 76 92 82  Temp: 98.4 F (36.9 C) 98.2 F (36.8 C) 99.2 F (37.3 C) 99.2 F (37.3 C)  TempSrc: Oral Oral Oral   Resp: 22 22 22 20   Height:      Weight:   160.664 kg (354 lb 3.2 oz)   SpO2: 100% 97% 100% 96%   Weight change: 5.08 kg (11 lb 3.2 oz)  Intake/Output Summary (Last 24 hours) at 11/07/11 1536 Last data filed at 11/07/11 1215  Gross per 24 hour  Intake    980 ml  Output      0 ml  Net    980 ml   Gen.: More alert. Nontoxic appearing Lungs clear to auscultation bilaterally without wheeze rhonchi or rales Cardiovascular regular rate rhythm without murmurs gallops rubs Abdomen soft nontender nondistended Extremities left leg still warm and edematous. Slightly improved proximally compared to yesterday. Foot less warm.  Lab Results: Basic Metabolic Panel:  Lab 11/06/11 4098 11/05/11 1324  NA 140 132*  K 4.2 3.1*  CL 105 95*  CO2 27 25  GLUCOSE 107* 126*  BUN 9 11  CREATININE 1.15* 1.25*  CALCIUM 8.6 8.9  MG -- --  PHOS -- --   Liver Function Tests: No results found for this basename: AST:2,ALT:2,ALKPHOS:2,BILITOT:2,PROT:2,ALBUMIN:2 in the last 168 hours No results found for this basename: LIPASE:2,AMYLASE:2 in the last 168 hours No results found for this basename: AMMONIA:2 in the last 168 hours CBC:  Lab 11/06/11 0635 11/05/11 1324  WBC 13.1* 18.8*  NEUTROABS -- 16.6*  HGB 10.3* 11.2*  HCT 32.2* 34.8*  MCV 82.1 81.3  PLT 204 252    Micro Results: No results found for this or any previous visit (from the past 240 hour(s)). Studies/Results: Dg Chest Portable 1  View  11/05/2011  *RADIOLOGY REPORT*  Clinical Data: Shortness breath.  Weakness.  Lower extremity swelling.  PORTABLE CHEST - 1 VIEW  Comparison: 09/02/2010  Findings: Very low lung volumes are seen, however both lungs remain grossly clear.  Heart size is normal.  IMPRESSION: Very low lung volumes.  No acute findings.  Original Report Authenticated By: Danae Orleans, M.D.   Scheduled Meds:    . ceftaroline (TEFLARO) 600 mg IVPB  600 mg Intravenous Q12H  . heparin  5,000 Units Subcutaneous Q8H   Continuous Infusions:  PRN Meds:.acetaminophen, acetaminophen, alum & mag hydroxide-simeth, ibuprofen, magnesium hydroxide, morphine injection, ondansetron (ZOFRAN) IV, ondansetron Assessment/Plan: Principal Problem:  *Cellulitis and abscess of leg and white blood cell count improved. Repeat labs tomorrow. Increase activity. May be able to discharge in a day or 2 if she remains stable. Ibuprofen for headache. Active Problems:  LYMPHEDEMA  Hypokalemia resolved  Cough chest x-ray normal.  Morbid obesity  Hyponatremia, resolved   LOS: 2 days   Darryn Kydd L 11/07/2011, 3:36 PM

## 2011-11-07 NOTE — Progress Notes (Signed)
UR Chart Review Completed  

## 2011-11-07 NOTE — Progress Notes (Signed)
Pt is up ambulating in hallway with no difficulty with family members. No complaints of distress.

## 2011-11-08 ENCOUNTER — Encounter (HOSPITAL_COMMUNITY): Payer: Self-pay | Admitting: Internal Medicine

## 2011-11-08 DIAGNOSIS — D649 Anemia, unspecified: Secondary | ICD-10-CM

## 2011-11-08 DIAGNOSIS — N179 Acute kidney failure, unspecified: Secondary | ICD-10-CM | POA: Diagnosis present

## 2011-11-08 HISTORY — DX: Anemia, unspecified: D64.9

## 2011-11-08 LAB — CBC
HCT: 29.2 % — ABNORMAL LOW (ref 36.0–46.0)
Hemoglobin: 9.2 g/dL — ABNORMAL LOW (ref 12.0–15.0)
MCH: 26.1 pg (ref 26.0–34.0)
MCV: 82.7 fL (ref 78.0–100.0)
RBC: 3.53 MIL/uL — ABNORMAL LOW (ref 3.87–5.11)

## 2011-11-08 LAB — BASIC METABOLIC PANEL
CO2: 26 mEq/L (ref 19–32)
Calcium: 9.1 mg/dL (ref 8.4–10.5)
Creatinine, Ser: 1.01 mg/dL (ref 0.50–1.10)
Glucose, Bld: 95 mg/dL (ref 70–99)

## 2011-11-08 MED ORDER — SODIUM CHLORIDE 0.9 % IJ SOLN
INTRAMUSCULAR | Status: AC
  Administered 2011-11-08: 08:00:00
  Filled 2011-11-08: qty 3

## 2011-11-08 MED ORDER — ACETAMINOPHEN 325 MG PO TABS: 650.0000 mg | ORAL_TABLET | Freq: Four times a day (QID) | ORAL | Status: DC | PRN

## 2011-11-08 MED ORDER — FUROSEMIDE 20 MG PO TABS: 20.0000 mg | ORAL_TABLET | Freq: Every day | ORAL | Status: DC | PRN

## 2011-11-08 MED ORDER — POTASSIUM CHLORIDE ER 10 MEQ PO TBCR: 10.0000 meq | EXTENDED_RELEASE_TABLET | Freq: Every day | ORAL | Status: DC

## 2011-11-08 MED ORDER — FUROSEMIDE 20 MG PO TABS
20.0000 mg | ORAL_TABLET | ORAL | Status: AC
  Administered 2011-11-08: 20 mg via ORAL
  Filled 2011-11-08: qty 1

## 2011-11-08 MED ORDER — DOXYCYCLINE HYCLATE 100 MG PO TABS: 100.0000 mg | ORAL_TABLET | Freq: Two times a day (BID) | ORAL | Status: AC

## 2011-11-08 MED ORDER — IBUPROFEN 200 MG PO TABS: 400.0000 mg | ORAL_TABLET | Freq: Four times a day (QID) | ORAL | Status: DC | PRN

## 2011-11-08 NOTE — Discharge Summary (Signed)
Physician Discharge Summary  [name] MRN: [id number] DOB/AGE: Jul 11, 1985 26 y.o.  PCP: Syliva Overman, MD   Admit date: 11/05/2011 Discharge date: 11/08/2011  Discharge Diagnoses:  1. Left lower extremity cellulitis superimposed upon chronic left lower extremity lymphedema. 2. Hypokalemia. 3. Hyponatremia.  4. Morbid obesity. 5. Cough. 6. Anemia, likely chronic. 7. Mild peripheral edema, status post volume repletion.    Medication List  As of 11/08/2011  2:05 PM   TAKE these medications         acetaminophen 325 MG tablet   Commonly known as: TYLENOL   Take 2 tablets (650 mg total) by mouth every 6 (six) hours as needed for pain.      doxycycline 100 MG tablet   Commonly known as: VIBRA-TABS   Take 1 tablet (100 mg total) by mouth 2 (two) times daily. Antibiotic to be taken for 4 more days.      furosemide 20 MG tablet   Commonly known as: LASIX   Take 1 tablet (20 mg total) by mouth daily as needed (For swelling.).      ibuprofen 200 MG tablet   Commonly known as: ADVIL,MOTRIN   Take 2 tablets (400 mg total) by mouth every 6 (six) hours as needed for pain. Pain      potassium chloride 10 MEQ tablet   Commonly known as: K-DUR   Take 1 tablet (10 mEq total) by mouth daily.            Discharge Condition: Improved.  Disposition: 01-Home or Self Care   Consults: None.   Significant Diagnostic Studies: Dg Chest Portable 1 View  11/05/2011  *RADIOLOGY REPORT*  Clinical Data: Shortness breath.  Weakness.  Lower extremity swelling.  PORTABLE CHEST - 1 VIEW  Comparison: 09/02/2010  Findings: Very low lung volumes are seen, however both lungs remain grossly clear.  Heart size is normal.  IMPRESSION: Very low lung volumes.  No acute findings.  Original Report Authenticated By: Danae Orleans, M.D.    Microbiology: No results found for this or any previous visit (from the past 240 hour(s)).   Labs: Results for orders placed during the hospital  encounter of 11/05/11 (from the past 48 hour(s))  CBC     Status: Abnormal   Collection Time   11/08/11  5:14 AM      Component Value Range Comment   WBC 9.7  4.0 - 10.5 K/uL    RBC 3.53 (*) 3.87 - 5.11 MIL/uL    Hemoglobin 9.2 (*) 12.0 - 15.0 g/dL    HCT 40.9 (*) 81.1 - 46.0 %    MCV 82.7  78.0 - 100.0 fL    MCH 26.1  26.0 - 34.0 pg    MCHC 31.5  30.0 - 36.0 g/dL    RDW 91.4  78.2 - 95.6 %    Platelets 227  150 - 400 K/uL   BASIC METABOLIC PANEL     Status: Abnormal   Collection Time   11/08/11  5:14 AM      Component Value Range Comment   Sodium 138  135 - 145 mEq/L    Potassium 3.7  3.5 - 5.1 mEq/L    Chloride 105  96 - 112 mEq/L    CO2 26  19 - 32 mEq/L    Glucose, Bld 95  70 - 99 mg/dL    BUN 6  6 - 23 mg/dL    Creatinine, Ser 2.13  0.50 - 1.10 mg/dL  Calcium 9.1  8.4 - 10.5 mg/dL    GFR calc non Af Amer 75 (*) >90 mL/min    GFR calc Af Amer 87 (*) >90 mL/min      HPI : The patient is a 26 year old woman with a history significant for recurrent left leg cellulitis and chronic lymphedema, who presented to the emergency department on 11/05/2011 with a chief complaint of left leg pain, warmth, and increased swelling. In the emergency department, she was febrile with temperature 101F, mildly hypotensive, and mildly tachycardic. Her lab data were significant for a serum sodium of 132, potassium of 3.1, creatinine 1.25, WBC of 18.8, and hemoglobin of 11.2. She was given a dose of Avelox. She was given a bolus of IV fluids. She was admitted for further evaluation and management.  HOSPITAL COURSE: The patient was continued on IV fluids for hydration. Her potassium was repleted in the IV fluids and orally. She was given as needed antiemetics for nausea. She was started on as needed IV and oral analgesics for pain. She was started on the Teflaro for treatment of the cellulitis as she is penicillin allergic. She was instructed to keep her legs elevated when in the sitting or supine  position. She developed a cough. A chest x-ray was ordered and it was relatively normal. She was treated accordingly with antitussive medications as needed.  Over the course of the hospitalization, the patient improved clinically and symptomatically. She remained afebrile following admission. Her serum sodium and serum potassium normalized. Her renal function also normalized. Her creatinine was 1.01 at the time of discharge. Her hemoglobin did decrease from 11.2-9.2, in part, because of hemodilution. She is actively menstruating woman although her menstrual periods have been irregular. Her white blood cell count normalized. She received 3 days of IV antibiotics during the hospitalization. She was discharged to home on 4 more days of doxycycline orally.  Apparently, her application for approval of Medicaid is pending. She was advised to followup with Dr. Lodema Hong and the lymphedema clinic in [place] when she receives her Medicaid card. She voiced understanding.  Discharge Exam: Blood pressure 123/75, pulse 91, temperature 98.6 F (37 C), temperature source Oral, resp. rate 20, height 5\' 9"  (1.753 m), weight 160.664 kg (354 lb 3.2 oz), last menstrual period 10/26/2011, SpO2 95.00%.  Lungs: Clear to auscultation bilaterally. Heart: S1, S2, with no murmurs rubs or gallops. Abdomen: Morbidly obese, positive bowel sounds, soft, nontender, nondistended. Extremities: Left leg with gross nonpitting edema, greater than 4+, nonerythematous, nontender.   Discharge Orders    Future Orders Please Complete By Expires   Diet - low sodium heart healthy      Increase activity slowly      Discharge instructions      Comments:   Keep your leg elevated. Followup with Dr. Lodema Hong and the lymphedema clinic in [place] when you get your Medicaid card.      Follow-up Information    Call Syliva Overman, MD. (As needed)    Contact information:   [address] [place] [address] [phone]           Total discharge time: Less than 35 minutes.   Signed: Willet Schleifer 11/08/2011, 2:05 PM

## 2011-11-08 NOTE — Progress Notes (Signed)
11/08/11 1256 Patient discharged home this afternoon. Reviewed discharge instructions with patient, given copy of instructions, med list, prescriptions, f/u information via teachback. Verbalizes understanding. No c/o pain or discomfort at time of discharge. Patient in stable condition to be discharged home with family. Riccardo Dubin

## 2011-12-22 ENCOUNTER — Emergency Department (HOSPITAL_COMMUNITY)
Admission: EM | Admit: 2011-12-22 | Discharge: 2011-12-22 | Disposition: A | Discharge status: Home / Self Care | Payer: Medicare Other | Attending: Nurse Practitioner | Admitting: Nurse Practitioner

## 2011-12-22 ENCOUNTER — Encounter (HOSPITAL_COMMUNITY): Payer: Self-pay | Admitting: Emergency Medicine

## 2011-12-22 DIAGNOSIS — Z87891 Personal history of nicotine dependence: Secondary | ICD-10-CM | POA: Insufficient documentation

## 2011-12-22 DIAGNOSIS — D649 Anemia, unspecified: Secondary | ICD-10-CM | POA: Diagnosis not present

## 2011-12-22 DIAGNOSIS — R05 Cough: Secondary | ICD-10-CM | POA: Diagnosis not present

## 2011-12-22 DIAGNOSIS — R0602 Shortness of breath: Secondary | ICD-10-CM | POA: Diagnosis not present

## 2011-12-22 DIAGNOSIS — J40 Bronchitis, not specified as acute or chronic: Secondary | ICD-10-CM | POA: Insufficient documentation

## 2011-12-22 DIAGNOSIS — J209 Acute bronchitis, unspecified: Secondary | ICD-10-CM | POA: Diagnosis not present

## 2011-12-22 MED ORDER — GUAIFENESIN-CODEINE 100-10 MG/5ML PO SYRP: 5.0000 mL | ORAL_SOLUTION | Freq: Three times a day (TID) | ORAL | Status: AC | PRN

## 2011-12-22 MED ORDER — AZITHROMYCIN 250 MG PO TABS: 250.0000 mg | ORAL_TABLET | Freq: Every day | ORAL | Status: AC

## 2011-12-22 NOTE — ED Provider Notes (Signed)
Medical screening examination/treatment/procedure(s) were performed by non-physician practitioner and as supervising physician I was immediately available for consultation/collaboration.   Taimane Stimmel, MD 12/22/11 1519 
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
## 2011-12-22 NOTE — ED Notes (Signed)
No redness noted in throat.  Slight dry cough. Lungs clear bilaterally A/P.  Has not taken anything OTC for symptoms.

## 2011-12-22 NOTE — ED Notes (Signed)
Patient with no complaints at this time. Respirations even and unlabored. Skin warm/dry. Discharge instructions reviewed with patient at this time. Patient given opportunity to voice concerns/ask questions. Patient discharged at this time and left Emergency Department with steady gait.   

## 2011-12-22 NOTE — ED Notes (Signed)
Pt c/o sore throat, cough, sneezing and nasal congestion x 2 days.

## 2011-12-28 ENCOUNTER — Ambulatory Visit: Payer: Medicare Other | Admitting: Family Medicine

## 2012-01-11 ENCOUNTER — Ambulatory Visit (INDEPENDENT_AMBULATORY_CARE_PROVIDER_SITE_OTHER): Payer: Medicare Other | Admitting: Family Medicine

## 2012-01-11 ENCOUNTER — Telehealth (HOSPITAL_COMMUNITY): Payer: Self-pay | Admitting: Dietician

## 2012-01-11 ENCOUNTER — Encounter: Payer: Self-pay | Admitting: Family Medicine

## 2012-01-11 VITALS — BP 116/72 | HR 98 | Resp 18 | Ht 69.0 in | Wt 348.1 lb

## 2012-01-11 DIAGNOSIS — Z309 Encounter for contraceptive management, unspecified: Secondary | ICD-10-CM

## 2012-01-11 DIAGNOSIS — I890 Lymphedema, not elsewhere classified: Secondary | ICD-10-CM | POA: Diagnosis not present

## 2012-01-11 DIAGNOSIS — N912 Amenorrhea, unspecified: Secondary | ICD-10-CM | POA: Diagnosis not present

## 2012-01-11 DIAGNOSIS — F329 Major depressive disorder, single episode, unspecified: Secondary | ICD-10-CM

## 2012-01-11 NOTE — Telephone Encounter (Signed)
Received referral via fax from Dr. Lodema Hong for dx: morbid obesity. Noted that pt was previously referred on 12/15/10 and was rescheduled for an appointment on 01/13/11, for which pt was a no-show.

## 2012-01-11 NOTE — Progress Notes (Signed)
  Subjective:    Patient ID: [name], female    DOB: Apr 28, 1985, 28 y.o.   MRN: [id number]  HPI Pt in for f/u has been hospitalized in July with cellutis and left lymphedema. Has been made disabled since July. Was in ED recently with bronchitis. Reports no cycle since early July Has not been to mental health for the year, plans to re establish, still has depression untreated, but not suicidal or homicidal, no hallucinations   Review of Systems See HPI Denies recent fever or chills. Denies sinus pressure, nasal congestion, ear pain or sore throat. Denies chest congestion, productive cough or wheezing. Denies chest pains, palpitations and leg swelling, PND or orthopnea. Unilateral severe lymphedema, wants to re establish with pT Denies abdominal pain, nausea, vomiting,diarrhea or constipation.   Denies dysuria, frequency, hesitancy or incontinence. Denies joint pain, swelling and limitation in mobility. Denies headaches, seizures, numbness, or tingling. Denies skin break down or rash.        Objective:   Physical Exam Patient alert and oriented and in no cardiopulmonary distress.  HEENT: No facial asymmetry, EOMI, no sinus tenderness,  oropharynx pink and moist.  Neck supple no adenopathy.  Chest: Clear to auscultation bilaterally.  CVS: S1, S2 no murmurs, no S3.  ABD: Soft non tender. Bowel sounds normal.  Ext: severe lymphedema left lower extremity  MS: Adequate ROM spine, shoulders, hips and knees.  Skin: Intact, no ulcerations or rash noted.  Psych: Good eye contact, normal affect. Memory intact not anxious or depressed appearing.  CNS: CN 2-12 intact, power, tone and sensation normal throughout.        Assessment & Plan:

## 2012-01-11 NOTE — Telephone Encounter (Signed)
Sent letter to pt home via [place] Mail in attempt to contact pt to schedule appointment.  

## 2012-01-11 NOTE — Patient Instructions (Addendum)
CPE in mid December.  Flu vaccine today.  Urine will be tested for pregnancy, if not pregnant, you will be placed on  Medicine to start a bleed   It is important that you exercise regularly at least 30 minutes 5 times a week. If you develop chest pain, have severe difficulty breathing, or feel very tired, stop exercising immediately and seek medical attention   A healthy diet is rich in fruit, vegetables and whole grains. Poultry fish, nuts and beans are a healthy choice for protein rather then red meat. A low sodium diet and drinking 64 ounces of water daily is generally recommended. Oils and sweet should be limited. Carbohydrates especially for those who are diabetic or overweight, should be limited to 30-45 gram per meal. It is important to eat on a regular schedule, at least 3 times daily. Snacks should be primarily fruits, vegetables or nuts.  You are referred to therapy in [place]

## 2012-01-15 DIAGNOSIS — N912 Amenorrhea, unspecified: Secondary | ICD-10-CM | POA: Insufficient documentation
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
## 2012-01-15 NOTE — Assessment & Plan Note (Signed)
Deteriorated. Patient re-educated about  the importance of commitment to a  minimum of 150 minutes of exercise per week. The importance of healthy food choices with portion control discussed. Encouraged to start a food diary, count calories and to consider  joining a support group. Sample diet sheets offered. Goals set by the patient for the next several months.    

## 2012-01-15 NOTE — Assessment & Plan Note (Signed)
No cycle x 2 months, if pregnancy test is negative she is to have provera, then stat contraception

## 2012-01-16 NOTE — Telephone Encounter (Signed)
Sent letter to pt home via [place] Mail in attempt to contact pt to schedule appointment.  

## 2012-01-17 NOTE — Addendum Note (Signed)
Addended by: Kandis Fantasia B on: 01/17/2012 10:27 AM   Modules accepted: Orders

## 2012-01-19 NOTE — Telephone Encounter (Signed)
Appointment scheduled for 01/24/12 at 3:00 PM.

## 2012-01-24 ENCOUNTER — Telehealth (HOSPITAL_COMMUNITY): Payer: Self-pay | Admitting: Dietician

## 2012-01-24 NOTE — Telephone Encounter (Signed)
Pt was a no-show for follow-up appointment scheduled for 01/24/12 at 3:00 PM. Sent letter via [place] Mail to pt home notifying pt of no-show.

## 2012-01-25 ENCOUNTER — Encounter (HOSPITAL_COMMUNITY): Payer: Self-pay | Admitting: Dietician

## 2012-01-25 DIAGNOSIS — I890 Lymphedema, not elsewhere classified: Secondary | ICD-10-CM | POA: Diagnosis not present

## 2012-01-25 DIAGNOSIS — IMO0001 Reserved for inherently not codable concepts without codable children: Secondary | ICD-10-CM | POA: Diagnosis not present
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
## 2012-01-27 DIAGNOSIS — I890 Lymphedema, not elsewhere classified: Secondary | ICD-10-CM | POA: Diagnosis not present

## 2012-01-27 DIAGNOSIS — IMO0001 Reserved for inherently not codable concepts without codable children: Secondary | ICD-10-CM | POA: Diagnosis not present

## 2012-01-30 DIAGNOSIS — IMO0001 Reserved for inherently not codable concepts without codable children: Secondary | ICD-10-CM | POA: Diagnosis not present

## 2012-01-30 DIAGNOSIS — I890 Lymphedema, not elsewhere classified: Secondary | ICD-10-CM | POA: Diagnosis not present

## 2012-02-01 DIAGNOSIS — I890 Lymphedema, not elsewhere classified: Secondary | ICD-10-CM | POA: Diagnosis not present

## 2012-02-01 DIAGNOSIS — IMO0001 Reserved for inherently not codable concepts without codable children: Secondary | ICD-10-CM | POA: Diagnosis not present

## 2012-02-02 DIAGNOSIS — I890 Lymphedema, not elsewhere classified: Secondary | ICD-10-CM | POA: Diagnosis not present

## 2012-02-02 DIAGNOSIS — IMO0001 Reserved for inherently not codable concepts without codable children: Secondary | ICD-10-CM | POA: Diagnosis not present

## 2012-02-03 DIAGNOSIS — I890 Lymphedema, not elsewhere classified: Secondary | ICD-10-CM | POA: Diagnosis not present

## 2012-02-03 DIAGNOSIS — IMO0001 Reserved for inherently not codable concepts without codable children: Secondary | ICD-10-CM | POA: Diagnosis not present

## 2012-02-06 DIAGNOSIS — IMO0001 Reserved for inherently not codable concepts without codable children: Secondary | ICD-10-CM | POA: Diagnosis not present

## 2012-02-06 DIAGNOSIS — I890 Lymphedema, not elsewhere classified: Secondary | ICD-10-CM | POA: Diagnosis not present

## 2012-02-08 DIAGNOSIS — IMO0001 Reserved for inherently not codable concepts without codable children: Secondary | ICD-10-CM | POA: Diagnosis not present

## 2012-02-08 DIAGNOSIS — I890 Lymphedema, not elsewhere classified: Secondary | ICD-10-CM | POA: Diagnosis not present

## 2012-02-09 DIAGNOSIS — IMO0001 Reserved for inherently not codable concepts without codable children: Secondary | ICD-10-CM | POA: Diagnosis not present

## 2012-02-09 DIAGNOSIS — I890 Lymphedema, not elsewhere classified: Secondary | ICD-10-CM | POA: Diagnosis not present

## 2012-02-10 DIAGNOSIS — I890 Lymphedema, not elsewhere classified: Secondary | ICD-10-CM | POA: Diagnosis not present

## 2012-02-10 DIAGNOSIS — IMO0001 Reserved for inherently not codable concepts without codable children: Secondary | ICD-10-CM | POA: Diagnosis not present

## 2012-02-13 DIAGNOSIS — I890 Lymphedema, not elsewhere classified: Secondary | ICD-10-CM | POA: Diagnosis not present

## 2012-02-13 DIAGNOSIS — IMO0001 Reserved for inherently not codable concepts without codable children: Secondary | ICD-10-CM | POA: Diagnosis not present

## 2012-02-15 DIAGNOSIS — I890 Lymphedema, not elsewhere classified: Secondary | ICD-10-CM | POA: Diagnosis not present

## 2012-02-15 DIAGNOSIS — IMO0001 Reserved for inherently not codable concepts without codable children: Secondary | ICD-10-CM | POA: Diagnosis not present

## 2012-02-17 DIAGNOSIS — I890 Lymphedema, not elsewhere classified: Secondary | ICD-10-CM | POA: Diagnosis not present

## 2012-02-17 DIAGNOSIS — IMO0001 Reserved for inherently not codable concepts without codable children: Secondary | ICD-10-CM | POA: Diagnosis not present

## 2012-02-20 DIAGNOSIS — I890 Lymphedema, not elsewhere classified: Secondary | ICD-10-CM | POA: Diagnosis not present

## 2012-02-20 DIAGNOSIS — IMO0001 Reserved for inherently not codable concepts without codable children: Secondary | ICD-10-CM | POA: Diagnosis not present

## 2012-02-22 DIAGNOSIS — IMO0001 Reserved for inherently not codable concepts without codable children: Secondary | ICD-10-CM | POA: Diagnosis not present

## 2012-02-22 DIAGNOSIS — I890 Lymphedema, not elsewhere classified: Secondary | ICD-10-CM | POA: Diagnosis not present

## 2012-02-23 ENCOUNTER — Telehealth (HOSPITAL_COMMUNITY): Payer: Self-pay | Admitting: Dietician

## 2012-02-23 NOTE — Telephone Encounter (Signed)
Mailed appointment confirmation letter and instructions for appointment scheduled 02/29/12 at 3:00 PM via [place] Mail.

## 2012-02-24 DIAGNOSIS — IMO0001 Reserved for inherently not codable concepts without codable children: Secondary | ICD-10-CM | POA: Diagnosis not present

## 2012-02-24 DIAGNOSIS — I890 Lymphedema, not elsewhere classified: Secondary | ICD-10-CM | POA: Diagnosis not present

## 2012-02-27 DIAGNOSIS — I890 Lymphedema, not elsewhere classified: Secondary | ICD-10-CM | POA: Diagnosis not present

## 2012-02-27 DIAGNOSIS — IMO0001 Reserved for inherently not codable concepts without codable children: Secondary | ICD-10-CM | POA: Diagnosis not present

## 2012-02-29 ENCOUNTER — Telehealth (HOSPITAL_COMMUNITY): Payer: Self-pay | Admitting: Dietician

## 2012-02-29 DIAGNOSIS — I890 Lymphedema, not elsewhere classified: Secondary | ICD-10-CM | POA: Diagnosis not present

## 2012-02-29 DIAGNOSIS — IMO0001 Reserved for inherently not codable concepts without codable children: Secondary | ICD-10-CM | POA: Diagnosis not present

## 2012-02-29 NOTE — Telephone Encounter (Addendum)
Pt was a no-show for follow-up appointment scheduled for 02/29/2012 at 3:00 PM. Sent letter to pt home notifying pt of no-show and requesting rescheduling appointment.

## 2012-03-02 ENCOUNTER — Telehealth (HOSPITAL_COMMUNITY): Payer: Self-pay | Admitting: Dietician

## 2012-03-02 DIAGNOSIS — IMO0001 Reserved for inherently not codable concepts without codable children: Secondary | ICD-10-CM | POA: Diagnosis not present

## 2012-03-02 DIAGNOSIS — I890 Lymphedema, not elsewhere classified: Secondary | ICD-10-CM | POA: Diagnosis not present

## 2012-03-02 NOTE — Telephone Encounter (Signed)
Pt requests rescheduling appointment. She reports that she thought her appointment was on 03/05/12 at 3:00 PM. Correspondence reminder, appointment card, and schedule indicate pt was scheduled for 02/29/12 at 3:00 PM. Pt rescheduled for 03/05/12 at 3:00 PM per her request.  Noted no show on 02/29/12 was pt's 2nd no show x 1 month.

## 2012-03-05 ENCOUNTER — Telehealth (HOSPITAL_COMMUNITY): Payer: Self-pay | Admitting: Dietician

## 2012-03-05 DIAGNOSIS — IMO0001 Reserved for inherently not codable concepts without codable children: Secondary | ICD-10-CM | POA: Diagnosis not present

## 2012-03-05 DIAGNOSIS — I890 Lymphedema, not elsewhere classified: Secondary | ICD-10-CM | POA: Diagnosis not present

## 2012-03-05 NOTE — Telephone Encounter (Signed)
Pt was a no-show for appointment scheduled for 03/05/2012 at 3:00 PM. Sent letter to pt home notifying pt of no-show and requesting rescheduling appointment. This is pt's 3rd no show x 1 month.

## 2012-03-06 NOTE — Telephone Encounter (Signed)
noted 

## 2012-03-07 DIAGNOSIS — I890 Lymphedema, not elsewhere classified: Secondary | ICD-10-CM | POA: Diagnosis not present

## 2012-03-07 DIAGNOSIS — IMO0001 Reserved for inherently not codable concepts without codable children: Secondary | ICD-10-CM | POA: Diagnosis not present

## 2012-03-08 DIAGNOSIS — I890 Lymphedema, not elsewhere classified: Secondary | ICD-10-CM | POA: Diagnosis not present

## 2012-03-08 DIAGNOSIS — IMO0001 Reserved for inherently not codable concepts without codable children: Secondary | ICD-10-CM | POA: Diagnosis not present

## 2012-03-09 DIAGNOSIS — I890 Lymphedema, not elsewhere classified: Secondary | ICD-10-CM | POA: Diagnosis not present

## 2012-03-09 DIAGNOSIS — IMO0001 Reserved for inherently not codable concepts without codable children: Secondary | ICD-10-CM | POA: Diagnosis not present

## 2012-03-12 DIAGNOSIS — IMO0001 Reserved for inherently not codable concepts without codable children: Secondary | ICD-10-CM | POA: Diagnosis not present

## 2012-03-12 DIAGNOSIS — I890 Lymphedema, not elsewhere classified: Secondary | ICD-10-CM | POA: Diagnosis not present

## 2012-03-14 DIAGNOSIS — I890 Lymphedema, not elsewhere classified: Secondary | ICD-10-CM | POA: Diagnosis not present

## 2012-03-14 DIAGNOSIS — IMO0001 Reserved for inherently not codable concepts without codable children: Secondary | ICD-10-CM | POA: Diagnosis not present

## 2012-03-15 DIAGNOSIS — I890 Lymphedema, not elsewhere classified: Secondary | ICD-10-CM | POA: Diagnosis not present

## 2012-03-15 DIAGNOSIS — IMO0001 Reserved for inherently not codable concepts without codable children: Secondary | ICD-10-CM | POA: Diagnosis not present

## 2012-03-16 DIAGNOSIS — I890 Lymphedema, not elsewhere classified: Secondary | ICD-10-CM | POA: Diagnosis not present

## 2012-03-16 DIAGNOSIS — IMO0001 Reserved for inherently not codable concepts without codable children: Secondary | ICD-10-CM | POA: Diagnosis not present

## 2012-03-20 DIAGNOSIS — I890 Lymphedema, not elsewhere classified: Secondary | ICD-10-CM | POA: Diagnosis not present

## 2012-03-20 DIAGNOSIS — IMO0001 Reserved for inherently not codable concepts without codable children: Secondary | ICD-10-CM | POA: Diagnosis not present

## 2012-03-21 DIAGNOSIS — IMO0001 Reserved for inherently not codable concepts without codable children: Secondary | ICD-10-CM | POA: Diagnosis not present

## 2012-03-21 DIAGNOSIS — I890 Lymphedema, not elsewhere classified: Secondary | ICD-10-CM | POA: Diagnosis not present

## 2012-03-27 DIAGNOSIS — IMO0001 Reserved for inherently not codable concepts without codable children: Secondary | ICD-10-CM | POA: Diagnosis not present

## 2012-03-27 DIAGNOSIS — I890 Lymphedema, not elsewhere classified: Secondary | ICD-10-CM | POA: Diagnosis not present

## 2012-03-28 DIAGNOSIS — I890 Lymphedema, not elsewhere classified: Secondary | ICD-10-CM | POA: Diagnosis not present

## 2012-03-28 DIAGNOSIS — IMO0001 Reserved for inherently not codable concepts without codable children: Secondary | ICD-10-CM | POA: Diagnosis not present

## 2012-03-29 DIAGNOSIS — IMO0001 Reserved for inherently not codable concepts without codable children: Secondary | ICD-10-CM | POA: Diagnosis not present

## 2012-03-29 DIAGNOSIS — I890 Lymphedema, not elsewhere classified: Secondary | ICD-10-CM | POA: Diagnosis not present

## 2012-04-02 DIAGNOSIS — IMO0001 Reserved for inherently not codable concepts without codable children: Secondary | ICD-10-CM | POA: Diagnosis not present

## 2012-04-02 DIAGNOSIS — I890 Lymphedema, not elsewhere classified: Secondary | ICD-10-CM | POA: Diagnosis not present

## 2012-04-04 DIAGNOSIS — I890 Lymphedema, not elsewhere classified: Secondary | ICD-10-CM | POA: Diagnosis not present

## 2012-04-04 DIAGNOSIS — IMO0001 Reserved for inherently not codable concepts without codable children: Secondary | ICD-10-CM | POA: Diagnosis not present

## 2012-04-05 DIAGNOSIS — IMO0001 Reserved for inherently not codable concepts without codable children: Secondary | ICD-10-CM | POA: Diagnosis not present

## 2012-04-05 DIAGNOSIS — I890 Lymphedema, not elsewhere classified: Secondary | ICD-10-CM | POA: Diagnosis not present

## 2012-04-09 ENCOUNTER — Encounter: Payer: Medicare Other | Admitting: Family Medicine

## 2012-04-09 DIAGNOSIS — I890 Lymphedema, not elsewhere classified: Secondary | ICD-10-CM | POA: Diagnosis not present

## 2012-04-09 DIAGNOSIS — IMO0001 Reserved for inherently not codable concepts without codable children: Secondary | ICD-10-CM | POA: Diagnosis not present

## 2012-04-10 DIAGNOSIS — I890 Lymphedema, not elsewhere classified: Secondary | ICD-10-CM | POA: Diagnosis not present

## 2012-04-10 DIAGNOSIS — IMO0001 Reserved for inherently not codable concepts without codable children: Secondary | ICD-10-CM | POA: Diagnosis not present

## 2012-04-11 ENCOUNTER — Encounter (HOSPITAL_COMMUNITY): Payer: Self-pay | Admitting: Emergency Medicine

## 2012-04-11 ENCOUNTER — Emergency Department (HOSPITAL_COMMUNITY)
Admission: EM | Admit: 2012-04-11 | Discharge: 2012-04-11 | Disposition: A | Discharge status: Home / Self Care | Payer: Medicare Other | Attending: Emergency Medicine | Admitting: Emergency Medicine

## 2012-04-11 DIAGNOSIS — Z79899 Other long term (current) drug therapy: Secondary | ICD-10-CM | POA: Diagnosis not present

## 2012-04-11 DIAGNOSIS — I890 Lymphedema, not elsewhere classified: Secondary | ICD-10-CM | POA: Diagnosis not present

## 2012-04-11 DIAGNOSIS — Z862 Personal history of diseases of the blood and blood-forming organs and certain disorders involving the immune mechanism: Secondary | ICD-10-CM | POA: Insufficient documentation

## 2012-04-11 DIAGNOSIS — Z9109 Other allergy status, other than to drugs and biological substances: Secondary | ICD-10-CM | POA: Insufficient documentation

## 2012-04-11 DIAGNOSIS — Z87891 Personal history of nicotine dependence: Secondary | ICD-10-CM | POA: Diagnosis not present

## 2012-04-11 DIAGNOSIS — R05 Cough: Secondary | ICD-10-CM | POA: Insufficient documentation

## 2012-04-11 DIAGNOSIS — N912 Amenorrhea, unspecified: Secondary | ICD-10-CM | POA: Insufficient documentation

## 2012-04-11 DIAGNOSIS — J019 Acute sinusitis, unspecified: Secondary | ICD-10-CM | POA: Insufficient documentation

## 2012-04-11 DIAGNOSIS — J029 Acute pharyngitis, unspecified: Secondary | ICD-10-CM | POA: Insufficient documentation

## 2012-04-11 DIAGNOSIS — IMO0002 Reserved for concepts with insufficient information to code with codable children: Secondary | ICD-10-CM | POA: Insufficient documentation

## 2012-04-11 DIAGNOSIS — R059 Cough, unspecified: Secondary | ICD-10-CM | POA: Diagnosis not present

## 2012-04-11 DIAGNOSIS — J329 Chronic sinusitis, unspecified: Secondary | ICD-10-CM

## 2012-04-11 MED ORDER — MOMETASONE FUROATE 50 MCG/ACT NA SUSP: 2.0000 | Freq: Every day | NASAL | Status: DC

## 2012-04-11 MED ORDER — AZITHROMYCIN 250 MG PO TABS: 250.0000 mg | ORAL_TABLET | ORAL | Status: DC

## 2012-04-11 NOTE — ED Notes (Signed)
Patient with no complaints at this time. Respirations even and unlabored. Skin warm/dry. Discharge instructions reviewed with patient at this time. Patient given opportunity to voice concerns/ask questions. Patient discharged at this time and left Emergency Department with steady gait.   

## 2012-04-11 NOTE — ED Notes (Signed)
Pt c/o cough and congestion x 2 weeks.
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
## 2012-04-12 DIAGNOSIS — I890 Lymphedema, not elsewhere classified: Secondary | ICD-10-CM | POA: Diagnosis not present

## 2012-04-12 DIAGNOSIS — IMO0001 Reserved for inherently not codable concepts without codable children: Secondary | ICD-10-CM | POA: Diagnosis not present

## 2012-04-20 DIAGNOSIS — IMO0001 Reserved for inherently not codable concepts without codable children: Secondary | ICD-10-CM | POA: Diagnosis not present

## 2012-04-20 DIAGNOSIS — I890 Lymphedema, not elsewhere classified: Secondary | ICD-10-CM | POA: Diagnosis not present

## 2012-04-26 DIAGNOSIS — IMO0001 Reserved for inherently not codable concepts without codable children: Secondary | ICD-10-CM | POA: Diagnosis not present

## 2012-04-26 DIAGNOSIS — I890 Lymphedema, not elsewhere classified: Secondary | ICD-10-CM | POA: Diagnosis not present

## 2012-04-27 DIAGNOSIS — IMO0001 Reserved for inherently not codable concepts without codable children: Secondary | ICD-10-CM | POA: Diagnosis not present

## 2012-04-27 DIAGNOSIS — I890 Lymphedema, not elsewhere classified: Secondary | ICD-10-CM | POA: Diagnosis not present

## 2012-05-02 DIAGNOSIS — IMO0001 Reserved for inherently not codable concepts without codable children: Secondary | ICD-10-CM | POA: Diagnosis not present

## 2012-05-02 DIAGNOSIS — I890 Lymphedema, not elsewhere classified: Secondary | ICD-10-CM | POA: Diagnosis not present

## 2012-05-04 DIAGNOSIS — I890 Lymphedema, not elsewhere classified: Secondary | ICD-10-CM | POA: Diagnosis not present

## 2012-05-04 DIAGNOSIS — IMO0001 Reserved for inherently not codable concepts without codable children: Secondary | ICD-10-CM | POA: Diagnosis not present

## 2012-05-07 DIAGNOSIS — I890 Lymphedema, not elsewhere classified: Secondary | ICD-10-CM | POA: Diagnosis not present

## 2012-05-07 DIAGNOSIS — IMO0001 Reserved for inherently not codable concepts without codable children: Secondary | ICD-10-CM | POA: Diagnosis not present

## 2012-05-09 DIAGNOSIS — IMO0001 Reserved for inherently not codable concepts without codable children: Secondary | ICD-10-CM | POA: Diagnosis not present

## 2012-05-09 DIAGNOSIS — I890 Lymphedema, not elsewhere classified: Secondary | ICD-10-CM | POA: Diagnosis not present

## 2012-05-11 DIAGNOSIS — IMO0001 Reserved for inherently not codable concepts without codable children: Secondary | ICD-10-CM | POA: Diagnosis not present

## 2012-05-11 DIAGNOSIS — I890 Lymphedema, not elsewhere classified: Secondary | ICD-10-CM | POA: Diagnosis not present

## 2012-05-15 DIAGNOSIS — I890 Lymphedema, not elsewhere classified: Secondary | ICD-10-CM | POA: Diagnosis not present

## 2012-05-15 DIAGNOSIS — IMO0001 Reserved for inherently not codable concepts without codable children: Secondary | ICD-10-CM | POA: Diagnosis not present

## 2012-05-16 DIAGNOSIS — I890 Lymphedema, not elsewhere classified: Secondary | ICD-10-CM | POA: Diagnosis not present

## 2012-05-16 DIAGNOSIS — IMO0001 Reserved for inherently not codable concepts without codable children: Secondary | ICD-10-CM | POA: Diagnosis not present

## 2012-05-17 DIAGNOSIS — I890 Lymphedema, not elsewhere classified: Secondary | ICD-10-CM | POA: Diagnosis not present

## 2012-05-17 DIAGNOSIS — IMO0001 Reserved for inherently not codable concepts without codable children: Secondary | ICD-10-CM | POA: Diagnosis not present

## 2012-05-21 DIAGNOSIS — I890 Lymphedema, not elsewhere classified: Secondary | ICD-10-CM | POA: Diagnosis not present

## 2012-05-21 DIAGNOSIS — IMO0001 Reserved for inherently not codable concepts without codable children: Secondary | ICD-10-CM | POA: Diagnosis not present

## 2012-05-25 DIAGNOSIS — IMO0001 Reserved for inherently not codable concepts without codable children: Secondary | ICD-10-CM | POA: Diagnosis not present

## 2012-05-25 DIAGNOSIS — I890 Lymphedema, not elsewhere classified: Secondary | ICD-10-CM | POA: Diagnosis not present

## 2012-05-29 DIAGNOSIS — IMO0001 Reserved for inherently not codable concepts without codable children: Secondary | ICD-10-CM | POA: Diagnosis not present

## 2012-05-29 DIAGNOSIS — I890 Lymphedema, not elsewhere classified: Secondary | ICD-10-CM | POA: Diagnosis not present

## 2012-06-01 DIAGNOSIS — I890 Lymphedema, not elsewhere classified: Secondary | ICD-10-CM | POA: Diagnosis not present

## 2012-06-01 DIAGNOSIS — IMO0001 Reserved for inherently not codable concepts without codable children: Secondary | ICD-10-CM | POA: Diagnosis not present

## 2012-06-14 DIAGNOSIS — IMO0001 Reserved for inherently not codable concepts without codable children: Secondary | ICD-10-CM | POA: Diagnosis not present

## 2012-06-14 DIAGNOSIS — I890 Lymphedema, not elsewhere classified: Secondary | ICD-10-CM | POA: Diagnosis not present

## 2012-11-05 ENCOUNTER — Encounter: Payer: Self-pay | Admitting: Family Medicine

## 2012-11-05 ENCOUNTER — Ambulatory Visit (INDEPENDENT_AMBULATORY_CARE_PROVIDER_SITE_OTHER): Payer: Medicare Other | Admitting: Family Medicine

## 2012-11-05 VITALS — BP 120/82 | HR 98 | Resp 16 | Ht 69.0 in | Wt 349.8 lb

## 2012-11-05 DIAGNOSIS — F329 Major depressive disorder, single episode, unspecified: Secondary | ICD-10-CM

## 2012-11-05 DIAGNOSIS — Z79899 Other long term (current) drug therapy: Secondary | ICD-10-CM

## 2012-11-05 DIAGNOSIS — I890 Lymphedema, not elsewhere classified: Secondary | ICD-10-CM

## 2012-11-05 DIAGNOSIS — N912 Amenorrhea, unspecified: Secondary | ICD-10-CM

## 2012-11-05 DIAGNOSIS — F1910 Other psychoactive substance abuse, uncomplicated: Secondary | ICD-10-CM

## 2012-11-05 DIAGNOSIS — R7301 Impaired fasting glucose: Secondary | ICD-10-CM

## 2012-11-05 DIAGNOSIS — F32A Depression, unspecified: Secondary | ICD-10-CM

## 2012-11-05 DIAGNOSIS — E559 Vitamin D deficiency, unspecified: Secondary | ICD-10-CM

## 2012-11-05 DIAGNOSIS — F1990 Other psychoactive substance use, unspecified, uncomplicated: Secondary | ICD-10-CM

## 2012-11-05 MED ORDER — VENLAFAXINE HCL ER 37.5 MG PO CP24: 37.5000 mg | ORAL_CAPSULE | Freq: Every day | ORAL | Status: DC

## 2012-11-05 NOTE — Assessment & Plan Note (Signed)
Unchanged. Patient re-educated about  the importance of commitment to a  minimum of 150 minutes of exercise per week. The importance of healthy food choices with portion control discussed. Encouraged to start a food diary, count calories and to consider  joining a support group. Sample diet sheets offered. Goals set by the patient for the next several months.    

## 2012-11-05 NOTE — Patient Instructions (Addendum)
F/u in mid September, call if you need me before  You are referred to therapy re left leg, also to psychiatry.  You need to start effexor for depression, and no other drug use (non prescription) while on medicatiion for depression  CBC, TSH, HBA1C, fasting li[pid and chem 7 and vit D today

## 2012-11-05 NOTE — Assessment & Plan Note (Signed)
Chronic left leg lymphedema, needs to return to therapy for help with wrapping and compression, states swelling better controlled when she gets therapy

## 2012-11-05 NOTE — Progress Notes (Signed)
  Subjective:    Patient ID: [name], female    DOB: August 16, 1985, 27 y.o.   MRN: [id number]  HPI The PT is here for follow up and re-evaluation of chronic medical conditions, medication management and review of any available recent lab and radiology data.  Preventive health is updated, specifically  Cancer screening and Immunization.   Was discharged from the PT dept in March, wantts a referral to return as she states the swelling of the leg is better controlled when handled through the dept. No skin breakdown. No menses since Jan wants to be checked for pregnancy, often has 2 cycles per year. Depressed mattwers of concern are her  weight ,left leg swelling, inability to conceive , no stable relationship. Self medicates with marijuana, wants to stop and has cut back, wants help through mental health services      Review of Systems See HPI Denies recent fever or chills. Denies sinus pressure, nasal congestion, ear pain or sore throat. Denies chest congestion, productive cough or wheezing. Denies chest pains, palpitations , PND or orthopnea Denies abdominal pain, nausea, vomiting,diarrhea or constipation.   Denies dysuria, frequency, hesitancy or incontinence. Denies joint pain, swelling and limitation in mobility. Denies headaches, seizures, numbness, or tingling.  Denies skin break down or rash.        Objective:   Physical Exam  Patient alert and oriented and in no cardiopulmonary distress.Morbidly obese  HEENT: No facial asymmetry, EOMI, no sinus tenderness,  oropharynx pink and moist.  Neck supple no adenopathy.  Chest: Clear to auscultation bilaterally.  CVS: S1, S2 no murmurs, no S3.  ABD: Soft non tender. Bowel sounds normal.  Ext: lymphedema of left lower extremity  MS: Adequate ROM spine, shoulders, hips and knees.  Skin: Intact, no ulcerations or rash noted.  Psych: Good eye contact, flat  affect. Memory intact  depressed appearing.  CNS: CN 2-12  intact, power, tone and sensation normal throughout.       Assessment & Plan:

## 2012-11-05 NOTE — Assessment & Plan Note (Signed)
Not actively suicidal though at times, has thoughts of this. Will start effexor and has been referred to mental health, and wants to go

## 2012-11-05 NOTE — Assessment & Plan Note (Signed)
No cycle since January, not abnormal for pt but concerned re possible pregnancy, though has been unable to conceive, urine preg test is negative

## 2012-11-06 ENCOUNTER — Telehealth: Payer: Self-pay | Admitting: Family Medicine

## 2012-11-06 LAB — BASIC METABOLIC PANEL
CO2: 27 mEq/L (ref 19–32)
Calcium: 9.2 mg/dL (ref 8.4–10.5)
Chloride: 103 mEq/L (ref 96–112)
Potassium: 4.4 mEq/L (ref 3.5–5.3)
Sodium: 141 mEq/L (ref 135–145)

## 2012-11-06 LAB — CBC
Hemoglobin: 12.4 g/dL (ref 12.0–15.0)
MCV: 78.1 fL (ref 78.0–100.0)
Platelets: 292 10*3/uL (ref 150–400)
RBC: 4.84 MIL/uL (ref 3.87–5.11)
WBC: 9.8 10*3/uL (ref 4.0–10.5)

## 2012-11-06 LAB — TSH: TSH: 4.25 u[IU]/mL (ref 0.350–4.500)

## 2012-11-06 LAB — LIPID PANEL: Total CHOL/HDL Ratio: 7 Ratio

## 2012-11-06 NOTE — Telephone Encounter (Signed)
Referral has already been  entered, nothing more can be done here, she may call to check on her appt if she wishes. Pls let her know

## 2012-11-07 ENCOUNTER — Emergency Department (HOSPITAL_COMMUNITY)
Admission: EM | Admit: 2012-11-07 | Discharge: 2012-11-07 | Disposition: A | Discharge status: Home / Self Care | Payer: Medicare Other | Attending: Emergency Medicine | Admitting: Emergency Medicine

## 2012-11-07 ENCOUNTER — Emergency Department (HOSPITAL_COMMUNITY): Payer: Medicare Other

## 2012-11-07 ENCOUNTER — Encounter (HOSPITAL_COMMUNITY): Payer: Self-pay | Admitting: *Deleted

## 2012-11-07 DIAGNOSIS — Z79899 Other long term (current) drug therapy: Secondary | ICD-10-CM | POA: Insufficient documentation

## 2012-11-07 DIAGNOSIS — Z880 Allergy status to penicillin: Secondary | ICD-10-CM | POA: Insufficient documentation

## 2012-11-07 DIAGNOSIS — M7730 Calcaneal spur, unspecified foot: Secondary | ICD-10-CM | POA: Insufficient documentation

## 2012-11-07 DIAGNOSIS — IMO0002 Reserved for concepts with insufficient information to code with codable children: Secondary | ICD-10-CM | POA: Insufficient documentation

## 2012-11-07 DIAGNOSIS — Z8742 Personal history of other diseases of the female genital tract: Secondary | ICD-10-CM | POA: Diagnosis not present

## 2012-11-07 DIAGNOSIS — M2540 Effusion, unspecified joint: Secondary | ICD-10-CM | POA: Insufficient documentation

## 2012-11-07 DIAGNOSIS — Z87891 Personal history of nicotine dependence: Secondary | ICD-10-CM | POA: Insufficient documentation

## 2012-11-07 DIAGNOSIS — Z8679 Personal history of other diseases of the circulatory system: Secondary | ICD-10-CM | POA: Insufficient documentation

## 2012-11-07 DIAGNOSIS — Z862 Personal history of diseases of the blood and blood-forming organs and certain disorders involving the immune mechanism: Secondary | ICD-10-CM | POA: Insufficient documentation

## 2012-11-07 DIAGNOSIS — M7732 Calcaneal spur, left foot: Secondary | ICD-10-CM

## 2012-11-07 MED ORDER — HYDROCODONE-ACETAMINOPHEN 5-325 MG PO TABS
2.0000 | ORAL_TABLET | Freq: Once | ORAL | Status: AC
  Administered 2012-11-07: 2 via ORAL
  Filled 2012-11-07: qty 2

## 2012-11-07 MED ORDER — HYDROCODONE-ACETAMINOPHEN 5-325 MG PO TABS: ORAL_TABLET | ORAL | Status: DC

## 2012-11-07 NOTE — ED Notes (Signed)
Hx of lymphedema. Pt states swelling doesn't seem to be any worse but she is having increased pain to the ankle area.

## 2012-11-07 NOTE — ED Notes (Signed)
Pt c/o pain to left heel area x 2 weeks.  Denies injury but says pain has progressively gotten worse.  Pt says swelling is no worse.

## 2012-11-09 ENCOUNTER — Telehealth: Payer: Self-pay | Admitting: Family Medicine

## 2012-11-09 DIAGNOSIS — R262 Difficulty in walking, not elsewhere classified: Secondary | ICD-10-CM | POA: Diagnosis not present

## 2012-11-09 DIAGNOSIS — M6281 Muscle weakness (generalized): Secondary | ICD-10-CM | POA: Diagnosis not present

## 2012-11-09 DIAGNOSIS — I890 Lymphedema, not elsewhere classified: Secondary | ICD-10-CM | POA: Diagnosis not present

## 2012-11-09 DIAGNOSIS — Z5189 Encounter for other specified aftercare: Secondary | ICD-10-CM | POA: Diagnosis not present

## 2012-11-09 NOTE — Telephone Encounter (Signed)
Also stated that she has appointment with [facility] physical therphy
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
## 2012-11-10 NOTE — ED Provider Notes (Signed)
Medical screening examination/treatment/procedure(s) were performed by non-physician practitioner and as supervising physician I was immediately available for consultation/collaboration.   Leylani Duley L Maudie Shingledecker, MD 11/10/12 0935 

## 2012-11-15 DIAGNOSIS — M7730 Calcaneal spur, unspecified foot: Secondary | ICD-10-CM | POA: Diagnosis not present

## 2012-11-15 DIAGNOSIS — M722 Plantar fascial fibromatosis: Secondary | ICD-10-CM | POA: Diagnosis not present

## 2012-11-19 ENCOUNTER — Ambulatory Visit (INDEPENDENT_AMBULATORY_CARE_PROVIDER_SITE_OTHER): Payer: Medicare Other | Admitting: Psychiatry

## 2012-11-19 ENCOUNTER — Encounter (HOSPITAL_COMMUNITY): Payer: Self-pay | Admitting: Psychiatry

## 2012-11-19 DIAGNOSIS — F411 Generalized anxiety disorder: Secondary | ICD-10-CM

## 2012-11-19 DIAGNOSIS — F329 Major depressive disorder, single episode, unspecified: Secondary | ICD-10-CM

## 2012-11-19 DIAGNOSIS — F419 Anxiety disorder, unspecified: Secondary | ICD-10-CM

## 2012-11-19 DIAGNOSIS — F3289 Other specified depressive episodes: Secondary | ICD-10-CM
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
## 2012-11-20 NOTE — Patient Instructions (Signed)
Discussed orally 

## 2012-11-23 DIAGNOSIS — I890 Lymphedema, not elsewhere classified: Secondary | ICD-10-CM | POA: Diagnosis not present

## 2012-11-23 DIAGNOSIS — Z5189 Encounter for other specified aftercare: Secondary | ICD-10-CM | POA: Diagnosis not present

## 2012-11-23 DIAGNOSIS — M6281 Muscle weakness (generalized): Secondary | ICD-10-CM | POA: Diagnosis not present

## 2012-11-23 DIAGNOSIS — R262 Difficulty in walking, not elsewhere classified: Secondary | ICD-10-CM | POA: Diagnosis not present

## 2012-11-26 ENCOUNTER — Ambulatory Visit (INDEPENDENT_AMBULATORY_CARE_PROVIDER_SITE_OTHER): Payer: Medicare Other | Admitting: Psychiatry

## 2012-11-26 DIAGNOSIS — F411 Generalized anxiety disorder: Secondary | ICD-10-CM | POA: Diagnosis not present

## 2012-11-26 DIAGNOSIS — F329 Major depressive disorder, single episode, unspecified: Secondary | ICD-10-CM | POA: Diagnosis not present

## 2012-11-26 DIAGNOSIS — F419 Anxiety disorder, unspecified: Secondary | ICD-10-CM
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
## 2012-11-26 NOTE — Patient Instructions (Addendum)
Discussed orally 

## 2012-12-05 ENCOUNTER — Ambulatory Visit (INDEPENDENT_AMBULATORY_CARE_PROVIDER_SITE_OTHER): Payer: Medicare Other | Admitting: Psychiatry

## 2012-12-05 DIAGNOSIS — F329 Major depressive disorder, single episode, unspecified: Secondary | ICD-10-CM

## 2012-12-05 DIAGNOSIS — F411 Generalized anxiety disorder: Secondary | ICD-10-CM | POA: Diagnosis not present

## 2012-12-05 DIAGNOSIS — F419 Anxiety disorder, unspecified: Secondary | ICD-10-CM
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
## 2012-12-07 NOTE — Patient Instructions (Signed)
Discussed orally 

## 2012-12-12 ENCOUNTER — Ambulatory Visit (HOSPITAL_COMMUNITY): Payer: Self-pay | Admitting: Psychiatry

## 2012-12-19 ENCOUNTER — Ambulatory Visit (HOSPITAL_COMMUNITY): Payer: Self-pay | Admitting: Psychiatry

## 2012-12-26 DIAGNOSIS — R262 Difficulty in walking, not elsewhere classified: Secondary | ICD-10-CM | POA: Diagnosis not present

## 2012-12-26 DIAGNOSIS — I890 Lymphedema, not elsewhere classified: Secondary | ICD-10-CM | POA: Diagnosis not present

## 2012-12-26 DIAGNOSIS — Z5189 Encounter for other specified aftercare: Secondary | ICD-10-CM | POA: Diagnosis not present

## 2012-12-26 DIAGNOSIS — M6281 Muscle weakness (generalized): Secondary | ICD-10-CM | POA: Diagnosis not present

## 2013-01-04 ENCOUNTER — Ambulatory Visit (HOSPITAL_COMMUNITY): Payer: Self-pay | Admitting: Psychiatry

## 2013-01-15 ENCOUNTER — Ambulatory Visit: Payer: Medicare Other | Admitting: Family Medicine

## 2013-01-23 DIAGNOSIS — IMO0001 Reserved for inherently not codable concepts without codable children: Secondary | ICD-10-CM | POA: Diagnosis not present

## 2013-01-23 DIAGNOSIS — I890 Lymphedema, not elsewhere classified: Secondary | ICD-10-CM | POA: Diagnosis not present

## 2013-01-24 DIAGNOSIS — IMO0001 Reserved for inherently not codable concepts without codable children: Secondary | ICD-10-CM | POA: Diagnosis not present

## 2013-01-24 DIAGNOSIS — I890 Lymphedema, not elsewhere classified: Secondary | ICD-10-CM | POA: Diagnosis not present

## 2013-01-25 ENCOUNTER — Ambulatory Visit (INDEPENDENT_AMBULATORY_CARE_PROVIDER_SITE_OTHER): Payer: Medicare Other | Admitting: Psychiatry

## 2013-01-25 DIAGNOSIS — F329 Major depressive disorder, single episode, unspecified: Secondary | ICD-10-CM | POA: Diagnosis not present

## 2013-01-25 DIAGNOSIS — F411 Generalized anxiety disorder: Secondary | ICD-10-CM | POA: Diagnosis not present

## 2013-01-25 DIAGNOSIS — F419 Anxiety disorder, unspecified: Secondary | ICD-10-CM

## 2013-01-25 NOTE — Patient Instructions (Signed)
Discussed orally 
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
## 2013-01-29 DIAGNOSIS — I890 Lymphedema, not elsewhere classified: Secondary | ICD-10-CM | POA: Diagnosis not present

## 2013-01-29 DIAGNOSIS — IMO0001 Reserved for inherently not codable concepts without codable children: Secondary | ICD-10-CM | POA: Diagnosis not present

## 2013-01-31 ENCOUNTER — Ambulatory Visit: Payer: Medicare Other | Admitting: Family Medicine

## 2013-01-31 DIAGNOSIS — IMO0001 Reserved for inherently not codable concepts without codable children: Secondary | ICD-10-CM | POA: Diagnosis not present

## 2013-01-31 DIAGNOSIS — I890 Lymphedema, not elsewhere classified: Secondary | ICD-10-CM | POA: Diagnosis not present

## 2013-02-01 DIAGNOSIS — I890 Lymphedema, not elsewhere classified: Secondary | ICD-10-CM | POA: Diagnosis not present

## 2013-02-01 DIAGNOSIS — IMO0001 Reserved for inherently not codable concepts without codable children: Secondary | ICD-10-CM | POA: Diagnosis not present

## 2013-02-04 ENCOUNTER — Ambulatory Visit: Payer: Medicare Other | Admitting: Family Medicine

## 2013-02-05 DIAGNOSIS — I890 Lymphedema, not elsewhere classified: Secondary | ICD-10-CM | POA: Diagnosis not present

## 2013-02-05 DIAGNOSIS — IMO0001 Reserved for inherently not codable concepts without codable children: Secondary | ICD-10-CM | POA: Diagnosis not present

## 2013-02-06 DIAGNOSIS — IMO0001 Reserved for inherently not codable concepts without codable children: Secondary | ICD-10-CM | POA: Diagnosis not present

## 2013-02-06 DIAGNOSIS — I890 Lymphedema, not elsewhere classified: Secondary | ICD-10-CM | POA: Diagnosis not present

## 2013-02-13 ENCOUNTER — Encounter: Payer: Self-pay | Admitting: Family Medicine

## 2013-02-13 ENCOUNTER — Encounter (INDEPENDENT_AMBULATORY_CARE_PROVIDER_SITE_OTHER): Payer: Self-pay

## 2013-02-13 ENCOUNTER — Ambulatory Visit (INDEPENDENT_AMBULATORY_CARE_PROVIDER_SITE_OTHER): Payer: Medicare Other | Admitting: Family Medicine

## 2013-02-13 VITALS — BP 128/84 | HR 81 | Resp 16 | Ht 69.0 in | Wt 352.1 lb

## 2013-02-13 DIAGNOSIS — E785 Hyperlipidemia, unspecified: Secondary | ICD-10-CM

## 2013-02-13 DIAGNOSIS — I890 Lymphedema, not elsewhere classified: Secondary | ICD-10-CM

## 2013-02-13 DIAGNOSIS — R7309 Other abnormal glucose: Secondary | ICD-10-CM

## 2013-02-13 DIAGNOSIS — F329 Major depressive disorder, single episode, unspecified: Secondary | ICD-10-CM

## 2013-02-13 DIAGNOSIS — F1910 Other psychoactive substance abuse, uncomplicated: Secondary | ICD-10-CM

## 2013-02-13 DIAGNOSIS — R7303 Prediabetes: Secondary | ICD-10-CM | POA: Insufficient documentation

## 2013-02-13 DIAGNOSIS — F1990 Other psychoactive substance use, unspecified, uncomplicated: Secondary | ICD-10-CM

## 2013-02-13 DIAGNOSIS — Z23 Encounter for immunization: Secondary | ICD-10-CM

## 2013-02-13 MED ORDER — VENLAFAXINE HCL 75 MG PO TABS: 75.0000 mg | ORAL_TABLET | Freq: Two times a day (BID) | ORAL | Status: DC

## 2013-02-13 NOTE — Progress Notes (Signed)
  Subjective:    Patient ID: [name], female    DOB: April 19, 1986, 27 y.o.   MRN: [id number]  HPI The PT is here for follow up and re-evaluation of chronic medical conditions, medication management and review of any available recent lab and radiology data.  Preventive health is updated, specifically  Cancer screening and Immunization.   Questions or concerns regarding consultations or procedures which the PT has had in the interim are  Addressed.She has actually been keeping appointments with therapist , and these have helped her. She has been non compliant with depression med, but promises that she will change this behavior Still unable to lose weight, mobility somewhat limited by lymphedema, and her control over eating is not good, will also work on this. Denies any current marijuana use and states alcohol use is much less "has no money", understands the need to quit and states she will "work on this"       Review of Systems See HPI Denies recent fever or chills. Denies sinus pressure, nasal congestion, ear pain or sore throat. Denies chest congestion, productive cough or wheezing. Denies chest pains, palpitations , PND or orthopnea Denies abdominal pain, nausea, vomiting,diarrhea or constipation.   Denies dysuria, frequency, hesitancy or incontinence. Denies joint pain, chronic left leg swelling which markedly limits her mobility Denies headaches, seizures, numbness, or tingling. Cf/o depression,less severe, not suicidal or homicidal, denies  anxiety or insomnia. Denies skin break down or rash.        Objective:   Physical Exam  Patient alert and oriented and in no cardiopulmonary distress.Morbidly obese  HEENT: No facial asymmetry, EOMI, no sinus tenderness,  oropharynx pink and moist.  Neck supple no adenopathy.  Chest: Clear to auscultation bilaterally.  CVS: S1, S2 no murmurs, no S3.  ABD: Soft non tender. Bowel sounds normal.  Ext: marked lymphedema pf  left leg  MS: Adequate ROM spine, shoulders, hips and knees.  Skin: Intact, no ulcerations or rash noted.  Psych: Good eye contact, normal affect. Memory intact not anxious or depressed appearing.  CNS: CN 2-12 intact, power, tone and sensation normal throughout.       Assessment & Plan:

## 2013-02-13 NOTE — Assessment & Plan Note (Signed)
Updated lab needed Patient educated about the importance of limiting  Carbohydrate intake , the need to commit to daily physical activity for a minimum of 30 minutes , and to commit weight loss. The fact that changes in all these areas will reduce or eliminate all together the development of diabetes is stressed.    

## 2013-02-13 NOTE — Assessment & Plan Note (Signed)
Reports no longer using in 01/2013 visit

## 2013-02-13 NOTE — Assessment & Plan Note (Signed)
Leg has increased in size again, pt to return to therapy

## 2013-02-13 NOTE — Assessment & Plan Note (Signed)
Improved with therapy despite non compliance with medication, dose effexort inc to 75mg  daily, pt understands the NEED to take this

## 2013-02-13 NOTE — Patient Instructions (Signed)
F/u in 3.5 month, call if you need me before  Flu vaccine today  Fasting lipid and HBA1C in 3 month, before visit  You will get a 1500 calorie diet sheet  Drink water not sweet tea  Eat 3 regular meals every day at same time, and snack only on fresh fruit and fresh vegetable  Avoid sweets, sugar , and eat more watery vegetables  Than bread, potato , pasta and starchy foods  Walk every day for 30 minutes total (you can break it up into three 10 minute sessions)  It is vital you take your medication for depression EVERY day, as prescribed

## 2013-02-13 NOTE — Assessment & Plan Note (Signed)
Unchnaged. Patient re-educated about  the importance of commitment to a  minimum of 150 minutes of exercise per week. The importance of healthy food choices with portion control discussed. Encouraged to start a food diary, count calories and to consider  joining a support group. Sample diet sheets offered. Goals set by the patient for the next several months.    

## 2013-02-13 NOTE — Assessment & Plan Note (Signed)
Hyperlipidemia:Low fat diet discussed and encouraged.  Updated lab for next visit 

## 2013-02-15 ENCOUNTER — Ambulatory Visit (INDEPENDENT_AMBULATORY_CARE_PROVIDER_SITE_OTHER): Payer: Medicare Other | Admitting: Psychiatry

## 2013-02-15 DIAGNOSIS — F411 Generalized anxiety disorder: Secondary | ICD-10-CM

## 2013-02-15 DIAGNOSIS — F3289 Other specified depressive episodes: Secondary | ICD-10-CM

## 2013-02-15 DIAGNOSIS — F329 Major depressive disorder, single episode, unspecified: Secondary | ICD-10-CM | POA: Diagnosis not present

## 2013-02-15 DIAGNOSIS — F419 Anxiety disorder, unspecified: Secondary | ICD-10-CM
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
## 2013-02-15 NOTE — Patient Instructions (Signed)
Discussed orally 

## 2013-02-19 ENCOUNTER — Encounter: Payer: Self-pay | Admitting: Family Medicine

## 2013-02-19 ENCOUNTER — Telehealth: Payer: Self-pay

## 2013-02-19 ENCOUNTER — Ambulatory Visit (INDEPENDENT_AMBULATORY_CARE_PROVIDER_SITE_OTHER): Payer: Medicare Other | Admitting: Family Medicine

## 2013-02-19 VITALS — BP 122/80 | HR 76 | Resp 18 | Ht 69.0 in | Wt 344.1 lb

## 2013-02-19 DIAGNOSIS — L049 Acute lymphadenitis, unspecified: Secondary | ICD-10-CM | POA: Diagnosis not present

## 2013-02-19 DIAGNOSIS — J029 Acute pharyngitis, unspecified: Secondary | ICD-10-CM

## 2013-02-19 DIAGNOSIS — H6690 Otitis media, unspecified, unspecified ear: Secondary | ICD-10-CM | POA: Diagnosis not present

## 2013-02-19 DIAGNOSIS — H6691 Otitis media, unspecified, right ear: Secondary | ICD-10-CM

## 2013-02-19 DIAGNOSIS — L040 Acute lymphadenitis of face, head and neck: Secondary | ICD-10-CM | POA: Insufficient documentation

## 2013-02-19 LAB — POCT RAPID STREP A (OFFICE): Rapid Strep A Screen: NEGATIVE

## 2013-02-19 MED ORDER — AZITHROMYCIN 250 MG PO TABS: ORAL_TABLET | ORAL | Status: AC

## 2013-02-19 NOTE — Telephone Encounter (Signed)
Patient is scheduled for ov.

## 2013-02-19 NOTE — Patient Instructions (Signed)
F/u as before  You are treated for acute sore throat and right otitis media  Zpack is sent to your pharmacy

## 2013-02-19 NOTE — Telephone Encounter (Signed)
Advise salt water gargles, voice rest , claritin one daily and sudafed one daily, both are otc. Advise if no fever ,, etc after several days l;ikely allergies or viral illness that needs no antibiotic or prescription medication

## 2013-02-19 NOTE — Progress Notes (Signed)
  Subjective:    Patient ID: [name], female    DOB: [birth year], 27 y.o.   MRN: [id number]  HPI 12 hour history of  Acute sore throat and right ear pain. No fever, has had chills. No drainage from thye ear or loss of hearing. Denies sinus pressure, nasal drainage or productive cough   Review of Systems See HPI Denies chest pains, palpitations and leg swelling Denies abdominal pain, nausea, vomiting,diarrhea or constipation.   Denies dysuria, frequency, hesitancy or incontinence. Denies joint pain, swelling and limitation in mobility. Denies headaches, seizures, numbness, or tingling. Denies uncontrolled  depression, anxiety or insomnia. Denies skin break down or rash.        Objective:   Physical Exam  Patient alert and oriented and in no cardiopulmonary distress.  HEENT: No facial asymmetry, EOMI, no sinus tenderness,  oropharynx erythematous with ulcer in right oropharynx, no exudate.  Neck supple right anterior cervical adenopathy.Right tM erythematous  Chest: Clear to auscultation bilaterally.  CVS: S1, S2 no murmurs, no S3.  ABD: Soft non tender. Bowel sounds normal.  Ext: No edema  MS: Adequate ROM spine, shoulders, hips and knees.  Skin: Intact, no ulcerations or rash noted.  Psych: Good eye contact, normal affect. Memory intact not anxious or depressed appearing.  CNS: CN 2-12 intact, power, tone and sensation normal throughout.       Assessment & Plan:

## 2013-02-21 DIAGNOSIS — H6691 Otitis media, unspecified, right ear: Secondary | ICD-10-CM | POA: Insufficient documentation

## 2013-02-21 DIAGNOSIS — J029 Acute pharyngitis, unspecified: Secondary | ICD-10-CM | POA: Insufficient documentation

## 2013-02-21 NOTE — Assessment & Plan Note (Signed)
z pack prescribed 

## 2013-02-25 ENCOUNTER — Telehealth: Payer: Self-pay | Admitting: Family Medicine

## 2013-02-25 NOTE — Telephone Encounter (Signed)
Advise her to use OTC hydrocortisone sparingly twice daily to affected area(s) but not on her face pls

## 2013-02-26 NOTE — Telephone Encounter (Signed)
Called and left message on personal voicemail with information

## 2013-03-04 ENCOUNTER — Ambulatory Visit (HOSPITAL_COMMUNITY): Payer: Self-pay | Admitting: Psychiatry

## 2013-03-04 DIAGNOSIS — IMO0001 Reserved for inherently not codable concepts without codable children: Secondary | ICD-10-CM | POA: Diagnosis not present

## 2013-03-04 DIAGNOSIS — I890 Lymphedema, not elsewhere classified: Secondary | ICD-10-CM | POA: Diagnosis not present

## 2013-03-06 DIAGNOSIS — I890 Lymphedema, not elsewhere classified: Secondary | ICD-10-CM | POA: Diagnosis not present

## 2013-03-06 DIAGNOSIS — IMO0001 Reserved for inherently not codable concepts without codable children: Secondary | ICD-10-CM | POA: Diagnosis not present

## 2013-03-08 ENCOUNTER — Encounter (HOSPITAL_COMMUNITY): Payer: Self-pay | Admitting: Emergency Medicine

## 2013-03-08 DIAGNOSIS — Z79899 Other long term (current) drug therapy: Secondary | ICD-10-CM | POA: Insufficient documentation

## 2013-03-08 DIAGNOSIS — Z87891 Personal history of nicotine dependence: Secondary | ICD-10-CM | POA: Diagnosis not present

## 2013-03-08 DIAGNOSIS — Z880 Allergy status to penicillin: Secondary | ICD-10-CM | POA: Insufficient documentation

## 2013-03-08 DIAGNOSIS — Z8709 Personal history of other diseases of the respiratory system: Secondary | ICD-10-CM | POA: Insufficient documentation

## 2013-03-08 DIAGNOSIS — R209 Unspecified disturbances of skin sensation: Secondary | ICD-10-CM | POA: Insufficient documentation

## 2013-03-08 DIAGNOSIS — H60399 Other infective otitis externa, unspecified ear: Secondary | ICD-10-CM | POA: Insufficient documentation

## 2013-03-08 DIAGNOSIS — Z862 Personal history of diseases of the blood and blood-forming organs and certain disorders involving the immune mechanism: Secondary | ICD-10-CM | POA: Insufficient documentation

## 2013-03-08 DIAGNOSIS — IMO0002 Reserved for concepts with insufficient information to code with codable children: Secondary | ICD-10-CM | POA: Diagnosis not present

## 2013-03-08 DIAGNOSIS — Z8742 Personal history of other diseases of the female genital tract: Secondary | ICD-10-CM | POA: Insufficient documentation

## 2013-03-08 DIAGNOSIS — I890 Lymphedema, not elsewhere classified: Secondary | ICD-10-CM | POA: Diagnosis not present

## 2013-03-08 DIAGNOSIS — IMO0001 Reserved for inherently not codable concepts without codable children: Secondary | ICD-10-CM | POA: Diagnosis not present

## 2013-03-08 NOTE — ED Notes (Signed)
Tingling all over, worse in my fingers per pt. Also having a left ear ache per pt.

## 2013-03-09 ENCOUNTER — Emergency Department (HOSPITAL_COMMUNITY)
Admission: EM | Admit: 2013-03-09 | Discharge: 2013-03-09 | Disposition: A | Discharge status: Home / Self Care | Payer: Medicare Other | Attending: Emergency Medicine | Admitting: Emergency Medicine

## 2013-03-09 DIAGNOSIS — H6091 Unspecified otitis externa, right ear: Secondary | ICD-10-CM

## 2013-03-09 MED ORDER — CIPROFLOXACIN-DEXAMETHASONE 0.3-0.1 % OT SUSP: 4.0000 [drp] | Freq: Two times a day (BID) | OTIC | Status: AC

## 2013-03-09 MED ORDER — ANTIPYRINE-BENZOCAINE 5.4-1.4 % OT SOLN
3.0000 [drp] | Freq: Once | OTIC | Status: AC
  Administered 2013-03-09: 4 [drp] via OTIC
  Filled 2013-03-09: qty 10

## 2013-03-09 MED ORDER — LORAZEPAM 1 MG PO TABS
1.0000 mg | ORAL_TABLET | Freq: Once | ORAL | Status: AC
  Administered 2013-03-09: 1 mg via ORAL
  Filled 2013-03-09: qty 1

## 2013-03-09 MED ORDER — CIPROFLOXACIN-DEXAMETHASONE 0.3-0.1 % OT SUSP
4.0000 [drp] | Freq: Two times a day (BID) | OTIC | Status: DC
  Administered 2013-03-09: 4 [drp] via OTIC
  Filled 2013-03-09: qty 7.5

## 2013-03-09 MED ORDER — CIPROFLOXACIN-DEXAMETHASONE 0.3-0.1 % OT SUSP
OTIC | Status: AC
  Filled 2013-03-09: qty 7.5

## 2013-03-09 MED ORDER — IBUPROFEN 800 MG PO TABS
800.0000 mg | ORAL_TABLET | Freq: Once | ORAL | Status: AC
  Administered 2013-03-09: 800 mg via ORAL
  Filled 2013-03-09: qty 1
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
## 2013-03-12 DIAGNOSIS — I890 Lymphedema, not elsewhere classified: Secondary | ICD-10-CM | POA: Diagnosis not present

## 2013-03-12 DIAGNOSIS — IMO0001 Reserved for inherently not codable concepts without codable children: Secondary | ICD-10-CM | POA: Diagnosis not present

## 2013-03-13 DIAGNOSIS — IMO0001 Reserved for inherently not codable concepts without codable children: Secondary | ICD-10-CM | POA: Diagnosis not present

## 2013-03-13 DIAGNOSIS — I890 Lymphedema, not elsewhere classified: Secondary | ICD-10-CM | POA: Diagnosis not present

## 2013-03-15 DIAGNOSIS — IMO0001 Reserved for inherently not codable concepts without codable children: Secondary | ICD-10-CM | POA: Diagnosis not present

## 2013-03-15 DIAGNOSIS — I890 Lymphedema, not elsewhere classified: Secondary | ICD-10-CM | POA: Diagnosis not present

## 2013-03-19 DIAGNOSIS — I890 Lymphedema, not elsewhere classified: Secondary | ICD-10-CM | POA: Diagnosis not present

## 2013-03-19 DIAGNOSIS — IMO0001 Reserved for inherently not codable concepts without codable children: Secondary | ICD-10-CM | POA: Diagnosis not present

## 2013-03-20 DIAGNOSIS — IMO0001 Reserved for inherently not codable concepts without codable children: Secondary | ICD-10-CM | POA: Diagnosis not present

## 2013-03-20 DIAGNOSIS — I890 Lymphedema, not elsewhere classified: Secondary | ICD-10-CM | POA: Diagnosis not present

## 2013-03-25 DIAGNOSIS — IMO0001 Reserved for inherently not codable concepts without codable children: Secondary | ICD-10-CM | POA: Diagnosis not present

## 2013-03-25 DIAGNOSIS — I890 Lymphedema, not elsewhere classified: Secondary | ICD-10-CM | POA: Diagnosis not present

## 2013-05-07 ENCOUNTER — Encounter (INDEPENDENT_AMBULATORY_CARE_PROVIDER_SITE_OTHER): Payer: Self-pay

## 2013-05-07 ENCOUNTER — Telehealth: Payer: Self-pay | Admitting: *Deleted

## 2013-05-07 ENCOUNTER — Ambulatory Visit (INDEPENDENT_AMBULATORY_CARE_PROVIDER_SITE_OTHER): Payer: Medicare Other | Admitting: Family Medicine

## 2013-05-07 ENCOUNTER — Encounter: Payer: Self-pay | Admitting: Family Medicine

## 2013-05-07 VITALS — BP 118/80 | HR 93 | Temp 97.8°F | Resp 16 | Ht 69.0 in | Wt 342.0 lb

## 2013-05-07 DIAGNOSIS — I890 Lymphedema, not elsewhere classified: Secondary | ICD-10-CM

## 2013-05-07 DIAGNOSIS — J040 Acute laryngitis: Secondary | ICD-10-CM

## 2013-05-07 DIAGNOSIS — F32A Depression, unspecified: Secondary | ICD-10-CM

## 2013-05-07 DIAGNOSIS — E8881 Metabolic syndrome: Secondary | ICD-10-CM

## 2013-05-07 DIAGNOSIS — R7309 Other abnormal glucose: Secondary | ICD-10-CM | POA: Diagnosis not present

## 2013-05-07 DIAGNOSIS — E785 Hyperlipidemia, unspecified: Secondary | ICD-10-CM

## 2013-05-07 DIAGNOSIS — J209 Acute bronchitis, unspecified: Secondary | ICD-10-CM

## 2013-05-07 DIAGNOSIS — F329 Major depressive disorder, single episode, unspecified: Secondary | ICD-10-CM

## 2013-05-07 DIAGNOSIS — J0100 Acute maxillary sinusitis, unspecified: Secondary | ICD-10-CM | POA: Diagnosis not present

## 2013-05-07 DIAGNOSIS — L259 Unspecified contact dermatitis, unspecified cause: Secondary | ICD-10-CM

## 2013-05-07 DIAGNOSIS — L309 Dermatitis, unspecified: Secondary | ICD-10-CM

## 2013-05-07 DIAGNOSIS — R7303 Prediabetes: Secondary | ICD-10-CM

## 2013-05-07 DIAGNOSIS — F3289 Other specified depressive episodes: Secondary | ICD-10-CM

## 2013-05-07 LAB — COMPREHENSIVE METABOLIC PANEL
ALK PHOS: 82 U/L (ref 39–117)
ALT: 26 U/L (ref 0–35)
AST: 21 U/L (ref 0–37)
Albumin: 4.4 g/dL (ref 3.5–5.2)
BILIRUBIN TOTAL: 0.4 mg/dL (ref 0.3–1.2)
BUN: 11 mg/dL (ref 6–23)
CO2: 30 meq/L (ref 19–32)
CREATININE: 0.93 mg/dL (ref 0.50–1.10)
Calcium: 9.8 mg/dL (ref 8.4–10.5)
Chloride: 104 mEq/L (ref 96–112)
GLUCOSE: 92 mg/dL (ref 70–99)
Potassium: 4.5 mEq/L (ref 3.5–5.3)
Sodium: 143 mEq/L (ref 135–145)
Total Protein: 7.9 g/dL (ref 6.0–8.3)

## 2013-05-07 LAB — LIPID PANEL
CHOL/HDL RATIO: 6.5 ratio
CHOLESTEROL: 214 mg/dL — AB (ref 0–200)
HDL: 33 mg/dL — AB (ref >39)
LDL CALC: 141 mg/dL — AB (ref 0–99)
TRIGLYCERIDES: 198 mg/dL — AB (ref <150)
VLDL: 40 mg/dL (ref 0–40)

## 2013-05-07 LAB — HEMOGLOBIN A1C
Hgb A1c MFr Bld: 5.9 % — ABNORMAL HIGH (ref <5.7)
Mean Plasma Glucose: 123 mg/dL — ABNORMAL HIGH (ref <117)

## 2013-05-07 MED ORDER — SULFAMETHOXAZOLE-TMP DS 800-160 MG PO TABS: 1.0000 | ORAL_TABLET | Freq: Two times a day (BID) | ORAL | Status: AC

## 2013-05-07 MED ORDER — BENZONATATE 100 MG PO CAPS: 100.0000 mg | ORAL_CAPSULE | Freq: Three times a day (TID) | ORAL | Status: DC | PRN

## 2013-05-07 MED ORDER — METFORMIN HCL ER 500 MG PO TB24: ORAL_TABLET | ORAL | Status: DC

## 2013-05-07 MED ORDER — FLUCONAZOLE 150 MG PO TABS: ORAL_TABLET | ORAL | Status: DC

## 2013-05-07 NOTE — Progress Notes (Signed)
   Subjective:    Patient ID: [name], female    DOB: 07/13/1985, 28 y.o.   MRN: [id number]  HPI 5 day h/o head and chest congestion , body aches, cough productive of yellow sputum and yellow nasal drainage, intermittent chills no fever documented. Also c/o loss of voice denies ear pain and sore throat Inconsistent attempts at lifestyle change persist, but she is making some progress and has succeeded in modest weight loss in the past 4 month, for which she is applauded. Still is treated for depression through psychiatry and is doing better, denies marijuana use currently   Review of Systems See HPI  Denies chest pains, palpitations and leg swelling Denies abdominal pain, nausea, vomiting,diarrhea or constipation.   Denies dysuria, frequency, hesitancy or incontinence. Denies joint pain, swelling and limitation in mobility. Denies headaches, seizures, numbness, or tingling. Denies skin break down or rash.        Objective:   Physical Exam  Patient alert and oriented and in no cardiopulmonary distress.  HEENT: No facial asymmetry, EOMI, maxillary  sinus tenderness,  oropharynx pink and moist.  Neck supple no adenopathy.No JVd, TM clear   Chest: adequate air entry scattered crackles, no wheezes  CVS: S1, S2 no murmurs, no S3.  ABD: Soft non tender. Bowel sounds normal.  Ext: No edema  MS: Adequate ROM spine, shoulders, hips and knees.  Skin: Intact, no ulcerations or rash noted.  Psych: Good eye contact, normal affect. Memory intact not anxious or depressed appearing.  CNS: CN 2-12 intact, power, tone and sensation normal throughout.       Assessment & Plan:

## 2013-05-07 NOTE — Telephone Encounter (Signed)
Pt called stating she was seen here this morning, stated that Dr. Lodema HongSimpson was going to give her something for her excema for her hands, Dr. Lodema HongSimpson gave pt something for yeast infection, pt states her lady parts are fine, she needs something for her hand itching. Please advise [phone], pt uses [facility] [place]

## 2013-05-07 NOTE — Patient Instructions (Signed)
F/u in 3 month, call if you need me before  You are being treated for acute maxillary sinusitis and laryngitis. Medication is sent in and you need to do salt water gargles and voice rest  New to help with weight loss and blood sugar is metformin one daily  Labs today lipid, cmp and HBA1C

## 2013-05-08 MED ORDER — BETAMETHASONE DIPROPIONATE 0.05 % EX CREA: TOPICAL_CREAM | Freq: Two times a day (BID) | CUTANEOUS | Status: DC

## 2013-05-08 NOTE — Telephone Encounter (Signed)
Noted will send in topical steroid

## 2013-05-11 ENCOUNTER — Emergency Department (HOSPITAL_COMMUNITY)
Admission: EM | Admit: 2013-05-11 | Discharge: 2013-05-11 | Disposition: A | Discharge status: Home / Self Care | Payer: Medicare Other | Attending: Emergency Medicine | Admitting: Emergency Medicine

## 2013-05-11 ENCOUNTER — Encounter (HOSPITAL_COMMUNITY): Payer: Self-pay | Admitting: Emergency Medicine

## 2013-05-11 DIAGNOSIS — Z3202 Encounter for pregnancy test, result negative: Secondary | ICD-10-CM | POA: Diagnosis not present

## 2013-05-11 DIAGNOSIS — Z862 Personal history of diseases of the blood and blood-forming organs and certain disorders involving the immune mechanism: Secondary | ICD-10-CM | POA: Insufficient documentation

## 2013-05-11 DIAGNOSIS — R197 Diarrhea, unspecified: Secondary | ICD-10-CM | POA: Insufficient documentation

## 2013-05-11 DIAGNOSIS — IMO0002 Reserved for concepts with insufficient information to code with codable children: Secondary | ICD-10-CM | POA: Diagnosis not present

## 2013-05-11 DIAGNOSIS — B9789 Other viral agents as the cause of diseases classified elsewhere: Secondary | ICD-10-CM | POA: Insufficient documentation

## 2013-05-11 DIAGNOSIS — B349 Viral infection, unspecified: Secondary | ICD-10-CM

## 2013-05-11 DIAGNOSIS — Z8742 Personal history of other diseases of the female genital tract: Secondary | ICD-10-CM | POA: Insufficient documentation

## 2013-05-11 DIAGNOSIS — Z87891 Personal history of nicotine dependence: Secondary | ICD-10-CM | POA: Insufficient documentation

## 2013-05-11 DIAGNOSIS — Z8709 Personal history of other diseases of the respiratory system: Secondary | ICD-10-CM | POA: Insufficient documentation

## 2013-05-11 DIAGNOSIS — Z79899 Other long term (current) drug therapy: Secondary | ICD-10-CM | POA: Diagnosis not present

## 2013-05-11 DIAGNOSIS — Z880 Allergy status to penicillin: Secondary | ICD-10-CM | POA: Diagnosis not present

## 2013-05-11 LAB — URINALYSIS, ROUTINE W REFLEX MICROSCOPIC
Bilirubin Urine: NEGATIVE
Glucose, UA: NEGATIVE mg/dL
KETONES UR: NEGATIVE mg/dL
LEUKOCYTES UA: NEGATIVE
Nitrite: NEGATIVE
PH: 6 (ref 5.0–8.0)
PROTEIN: NEGATIVE mg/dL
Specific Gravity, Urine: 1.015 (ref 1.005–1.030)
Urobilinogen, UA: 0.2 mg/dL (ref 0.0–1.0)

## 2013-05-11 LAB — URINE MICROSCOPIC-ADD ON

## 2013-05-11 LAB — PREGNANCY, URINE: Preg Test, Ur: NEGATIVE

## 2013-05-11 MED ORDER — ONDANSETRON HCL 8 MG PO TABS: 8.0000 mg | ORAL_TABLET | ORAL | Status: DC | PRN

## 2013-05-11 MED ORDER — ONDANSETRON 8 MG PO TBDP
8.0000 mg | ORAL_TABLET | Freq: Once | ORAL | Status: AC
  Administered 2013-05-11: 8 mg via ORAL
  Filled 2013-05-11: qty 1

## 2013-05-11 NOTE — Discharge Instructions (Signed)
Increase fluids. Tylenol for fever. Medication for nausea and vomiting.
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
## 2013-05-11 NOTE — ED Notes (Signed)
C/o n/v/d onset yesterday.

## 2013-05-11 NOTE — ED Notes (Signed)
Pt with N/V/D since yesterday, had diarrhea since in room, denies any vomiting today but states that she has not tried to drink or eat anything today

## 2013-05-12 DIAGNOSIS — E8881 Metabolic syndrome: Secondary | ICD-10-CM | POA: Insufficient documentation

## 2013-05-12 DIAGNOSIS — J040 Acute laryngitis: Secondary | ICD-10-CM | POA: Insufficient documentation

## 2013-05-12 NOTE — Assessment & Plan Note (Signed)
Decongestant and antibiotic prescribed 

## 2013-05-12 NOTE — Assessment & Plan Note (Signed)
Voice rest and salt water gargles advised

## 2013-05-12 NOTE — Assessment & Plan Note (Signed)
Improving now in the care of psychoiatry

## 2013-05-12 NOTE — Assessment & Plan Note (Signed)
Improved. Pt applauded on succesful weight loss through lifestyle change, and encouraged to continue same. Weight loss goal set for the next several months.  

## 2013-05-12 NOTE — Assessment & Plan Note (Signed)
Unchanged, receives out pt Pt for this which is beneficial

## 2013-05-12 NOTE — Assessment & Plan Note (Signed)
Improved, lifestyle change to lower fat intake will effectively correct this, hopefully pt will stick with plan

## 2013-05-12 NOTE — Assessment & Plan Note (Signed)
deteriorated , start metformin Patient educated about the importance of limiting  Carbohydrate intake , the need to commit to daily physical activity for a minimum of 30 minutes , and to commit weight loss. The fact that changes in all these areas will reduce or eliminate all together the development of diabetes is stressed.

## 2013-05-12 NOTE — Assessment & Plan Note (Signed)
Pt at increased CV risk and needs to consistently work on lifestyle changes to reverse this, this is explained to her

## 2013-05-12 NOTE — Assessment & Plan Note (Signed)
antibiotoic prescribed

## 2013-06-03 ENCOUNTER — Ambulatory Visit: Payer: Medicare Other | Admitting: Family Medicine

## 2013-06-04 ENCOUNTER — Ambulatory Visit (INDEPENDENT_AMBULATORY_CARE_PROVIDER_SITE_OTHER): Payer: Medicare Other | Admitting: Family Medicine

## 2013-06-04 ENCOUNTER — Encounter: Payer: Self-pay | Admitting: Family Medicine

## 2013-06-04 VITALS — BP 124/78 | HR 100 | Temp 98.6°F | Resp 16 | Ht 69.0 in | Wt 350.8 lb

## 2013-06-04 DIAGNOSIS — J0100 Acute maxillary sinusitis, unspecified: Secondary | ICD-10-CM | POA: Diagnosis not present

## 2013-06-04 DIAGNOSIS — J209 Acute bronchitis, unspecified: Secondary | ICD-10-CM | POA: Diagnosis not present

## 2013-06-04 DIAGNOSIS — J309 Allergic rhinitis, unspecified: Secondary | ICD-10-CM

## 2013-06-04 DIAGNOSIS — E785 Hyperlipidemia, unspecified: Secondary | ICD-10-CM

## 2013-06-04 DIAGNOSIS — R7303 Prediabetes: Secondary | ICD-10-CM

## 2013-06-04 DIAGNOSIS — R7309 Other abnormal glucose: Secondary | ICD-10-CM | POA: Diagnosis not present

## 2013-06-04 MED ORDER — LORATADINE 10 MG PO TABS: 10.0000 mg | ORAL_TABLET | Freq: Every day | ORAL | Status: DC

## 2013-06-04 MED ORDER — LEVOFLOXACIN 500 MG PO TABS: 500.0000 mg | ORAL_TABLET | Freq: Every day | ORAL | Status: DC

## 2013-06-04 NOTE — Patient Instructions (Signed)
F/U in May as before, call if you need me before  You are treated for sinusitis and bronchitis. If you have problems with levaquin please call , also claritin is prescribed  It is important that you exercise regularly at least 30 minutes 5 times a week. If you develop chest pain, have severe difficulty breathing, or feel very tired, stop exercising immediately and seek medical attention    A healthy diet is rich in fruit, vegetables and whole grains. Poultry fish, nuts and beans are a healthy choice for protein rather then red meat. A low sodium diet and drinking 64 ounces of water daily is generally recommended. Oils and sweet should be limited. Carbohydrates especially for those who are diabetic or overweight, should be limited to 45 to 60 gram per meal. It is important to eat on a regular schedule, at least 3 times daily. Snacks should be primarily fruits, vegetables or nuts.  Adequate rest, generally 6 to 8 hours per night is important for good health.Good sleep hygiene involves setting a regular bedtime, and turning off all sound and light in your sleep environment.Limiting caffeine intake will also help with the ability to rest well  Lab work needs to be done 3 to 5 days before your follow up visit please.HBa1C   All medications need to be brought to every visit

## 2013-06-04 NOTE — Assessment & Plan Note (Addendum)
1 week h/o yellow sputum, did not take the septra prescribed states had bad s/e aware of the need to call if unable to tolerate the levaquin

## 2013-06-11 NOTE — Assessment & Plan Note (Signed)
Antibiotics and decongestants prescribed 

## 2013-06-11 NOTE — Progress Notes (Signed)
   Subjective:    Patient ID: [name], female    DOB: 03/27/1986, 28 y.o.   MRN: [id number]  HPI The PT is here for follow up and re-evaluation of chronic medical conditions, medication management and review of any available recent lab and radiology data.  Preventive health is updated, specifically  Cancer screening and Immunization.   Did not complete course of septra that had been prescribed in January, now presents with 1 week h/o increased head and chest congestion, yellow green thick nasal drainage and sputum, fever, malaise intermittent chills         Review of Systems See HPI Denies chest pains, palpitations and leg swelling Denies abdominal pain, nausea, vomiting,diarrhea or constipation.   Denies dysuria, frequency, hesitancy or incontinence. Denies joint pain, swelling and limitation in mobility. Denies headaches, seizures, numbness, or tingling. Denies depression, anxiety or insomnia. Denies skin break down or rash.     Denies chest pains, palpitations and leg swelling Denies abdominal pain, nausea, vomiting,diarrhea or constipation.   Denies dysuria, frequency, hesitancy or incontinence. Denies joint pain, swelling and limitation in mobility. Denies headaches, seizures, numbness, or tingling. Denies depression, anxiety or insomnia. Denies skin break down or rash.        Objective:   Physical Exam BP 124/78  Pulse 100  Temp(Src) 98.6 F (37 C) (Oral)  Resp 16  Ht 5\' 9"  (1.753 m)  Wt 350 lb 12.8 oz (159.122 kg)  BMI 51.78 kg/m2  SpO2 100%  LMP 03/22/2013 Patient alert and oriented and in no cardiopulmonary distress.  HEENT: No facial asymmetry, EOMI, maxillary sinus tenderness,  oropharynx pink and moist.  Neck supple no adenopathy.  Chest: decreased air entry scattered crackles no wheezes  CVS: S1, S2 no murmurs, no S3.  ABD: Soft non tender. Bowel sounds normal.  Ext: No edema  MS: Adequate ROM spine, shoulders, hips and  knees.  Skin: Intact, no ulcerations or rash noted.  Psych: Good eye contact, normal affect. Memory intact not anxious or depressed appearing.  CNS: CN 2-12 intact, power, tone and sensation normal throughout.        Assessment & Plan:  Acute bronchitis 1 week h/o yellow sputum, did not take the septra prescribed states had bad s/e aware of the need to call if unable to tolerate the levaquin  Acute maxillary sinusitis Antibiotics and decongestants prescribed .     ALLERGIC RHINITIS Uncontrolled , needs daily med  Morbid obesity Deteriorated. Patient re-educated about  the importance of commitment to a  minimum of 150 minutes of exercise per week. The importance of healthy food choices with portion control discussed. Encouraged to start a food diary, count calories and to consider  joining a support group. Sample diet sheets offered. Goals set by the patient for the next several months.     Other and unspecified hyperlipidemia Improved, but not at goal Hyperlipidemia:Low fat diet discussed and encouraged.

## 2013-06-11 NOTE — Assessment & Plan Note (Signed)
Uncontrolled , needs daily med

## 2013-06-11 NOTE — Assessment & Plan Note (Signed)
Improved, but not at goal Hyperlipidemia:Low fat diet discussed and encouraged.

## 2013-06-11 NOTE — Assessment & Plan Note (Signed)
Deteriorated. Patient re-educated about  the importance of commitment to a  minimum of 150 minutes of exercise per week. The importance of healthy food choices with portion control discussed. Encouraged to start a food diary, count calories and to consider  joining a support group. Sample diet sheets offered. Goals set by the patient for the next several months.    

## 2013-07-25 ENCOUNTER — Encounter: Payer: Self-pay | Admitting: Family Medicine

## 2013-07-25 ENCOUNTER — Ambulatory Visit (INDEPENDENT_AMBULATORY_CARE_PROVIDER_SITE_OTHER): Payer: Medicare Other | Admitting: Family Medicine

## 2013-07-25 ENCOUNTER — Encounter (INDEPENDENT_AMBULATORY_CARE_PROVIDER_SITE_OTHER): Payer: Self-pay

## 2013-07-25 VITALS — BP 116/80 | HR 92 | Temp 98.5°F | Resp 18 | Ht 69.0 in | Wt 347.1 lb

## 2013-07-25 DIAGNOSIS — F32A Depression, unspecified: Secondary | ICD-10-CM

## 2013-07-25 DIAGNOSIS — F3289 Other specified depressive episodes: Secondary | ICD-10-CM

## 2013-07-25 DIAGNOSIS — J029 Acute pharyngitis, unspecified: Secondary | ICD-10-CM

## 2013-07-25 DIAGNOSIS — F329 Major depressive disorder, single episode, unspecified: Secondary | ICD-10-CM

## 2013-07-25 DIAGNOSIS — J309 Allergic rhinitis, unspecified: Secondary | ICD-10-CM | POA: Diagnosis not present

## 2013-07-25 DIAGNOSIS — I890 Lymphedema, not elsewhere classified: Secondary | ICD-10-CM

## 2013-07-25 LAB — POCT RAPID STREP A (OFFICE): Rapid Strep A Screen: NEGATIVE

## 2013-07-25 MED ORDER — PREDNISONE (PAK) 5 MG PO TABS: 5.0000 mg | ORAL_TABLET | ORAL | Status: DC

## 2013-07-25 MED ORDER — FLUTICASONE PROPIONATE 50 MCG/ACT NA SUSP
2.0000 | Freq: Every day | NASAL | Status: DC
Start: 2013-07-25 — End: 2014-07-22

## 2013-07-25 NOTE — Patient Instructions (Signed)
F/u as before.  You are treated for uncontrolled allergies with sinusitis. No infection present. At this time  Strep throat check is negative

## 2013-07-27 DIAGNOSIS — J029 Acute pharyngitis, unspecified: Secondary | ICD-10-CM | POA: Insufficient documentation

## 2013-07-27 NOTE — Assessment & Plan Note (Signed)
Uncontrolled , daily use of medication is emphasized and prednisone short course prescribed also

## 2013-07-27 NOTE — Progress Notes (Signed)
   Subjective:    Patient ID: [name], female    DOB: 06/22/1985, 28 y.o.   MRN: [id number]  HPI 4 day h/o increased head congestion , clear drainage, sneezing watery eyes, also c/o sore throat and strep exposure. Cough intermittently, no sputum production   Review of Systems See HPI  Denies chest pains, palpitations, PND, has chronic left  leg swelling Denies abdominal pain, nausea, vomiting,diarrhea or constipation.   Denies dysuria, frequency, hesitancy or incontinence. Denies joint pain, swelling and limitation in mobility. Denies headaches, seizures, numbness, or tingling. States her  Depression,is improveing , denies  anxiety or insomnia. Denies skin break down or rash.        Objective:   Physical Exam BP 116/80  Pulse 92  Temp(Src) 98.5 F (36.9 C)  Resp 18  Ht 5\' 9"  (1.753 m)  Wt 347 lb 1.3 oz (157.434 kg)  BMI 51.23 kg/m2  SpO2 97% Patient alert and oriented and in no cardiopulmonary distress.  HEENT: No facial asymmetry, EOMI, no sinus tenderness,  oropharynx pink and moist.  Neck supple no adenopathy.TM clear  Chest: Clear to auscultation bilaterally.  CVS: S1, S2 no murmurs, no S3.  ABD: Soft non tender. Bowel sounds normal.  Ext: lymphedema left leg  MS: Adequate ROM spine, shoulders, hips and knees.  Skin: Intact, no ulcerations or rash noted.  Psych: Good eye contact, normal affect. Memory intact not anxious or depressed appearing.  CNS: CN 2-12 intact, power, tone and sensation normal throughout.        Assessment & Plan:  Sore throat Rapid strep negative and exam is benign, not suggestive of infection  Depression Continue therapy and current meds re eval next  Visit objectively states she is improving  ALLERGIC RHINITIS Uncontrolled , daily use of medication is emphasized and prednisone short course prescribed also  Morbid obesity Deteriorated. Patient re-educated about  the importance of commitment to a  minimum of  150 minutes of exercise per week. The importance of healthy food choices with portion control discussed. Encouraged to start a food diary, count calories and to consider  joining a support group. Sample diet sheets offered. Goals set by the patient for the next several months.     LYMPHEDEMA Unchanged, continue out patient PT

## 2013-07-27 NOTE — Assessment & Plan Note (Signed)
Deteriorated. Patient re-educated about  the importance of commitment to a  minimum of 150 minutes of exercise per week. The importance of healthy food choices with portion control discussed. Encouraged to start a food diary, count calories and to consider  joining a support group. Sample diet sheets offered. Goals set by the patient for the next several months.    

## 2013-07-27 NOTE — Assessment & Plan Note (Signed)
Rapid strep negative and exam is benign, not suggestive of infection

## 2013-07-27 NOTE — Assessment & Plan Note (Signed)
Unchanged, continue out patient PT

## 2013-07-27 NOTE — Assessment & Plan Note (Signed)
Continue therapy and current meds re eval next  Visit objectively states she is improving

## 2013-08-04 ENCOUNTER — Emergency Department (HOSPITAL_COMMUNITY)
Admission: EM | Admit: 2013-08-04 | Discharge: 2013-08-04 | Disposition: A | Discharge status: Home / Self Care | Payer: Medicare Other | Attending: Emergency Medicine | Admitting: Emergency Medicine

## 2013-08-04 ENCOUNTER — Encounter (HOSPITAL_COMMUNITY): Payer: Self-pay | Admitting: Emergency Medicine

## 2013-08-04 DIAGNOSIS — Z87891 Personal history of nicotine dependence: Secondary | ICD-10-CM | POA: Diagnosis not present

## 2013-08-04 DIAGNOSIS — R279 Unspecified lack of coordination: Secondary | ICD-10-CM | POA: Diagnosis not present

## 2013-08-04 DIAGNOSIS — Z880 Allergy status to penicillin: Secondary | ICD-10-CM | POA: Insufficient documentation

## 2013-08-04 DIAGNOSIS — IMO0002 Reserved for concepts with insufficient information to code with codable children: Secondary | ICD-10-CM | POA: Diagnosis not present

## 2013-08-04 DIAGNOSIS — Z79899 Other long term (current) drug therapy: Secondary | ICD-10-CM | POA: Insufficient documentation

## 2013-08-04 DIAGNOSIS — Z8742 Personal history of other diseases of the female genital tract: Secondary | ICD-10-CM | POA: Diagnosis not present

## 2013-08-04 DIAGNOSIS — J309 Allergic rhinitis, unspecified: Secondary | ICD-10-CM | POA: Insufficient documentation

## 2013-08-04 DIAGNOSIS — Z862 Personal history of diseases of the blood and blood-forming organs and certain disorders involving the immune mechanism: Secondary | ICD-10-CM | POA: Insufficient documentation

## 2013-08-04 DIAGNOSIS — J302 Other seasonal allergic rhinitis: Secondary | ICD-10-CM

## 2013-08-04 MED ORDER — OXYMETAZOLINE HCL 0.05 % NA SOLN: 2.0000 | Freq: Two times a day (BID) | NASAL | Status: DC | PRN

## 2013-08-04 MED ORDER — SODIUM CHLORIDE-SODIUM BICARB 2300-700 MG NA KIT: 1.0000 "application " | PACK | Freq: Two times a day (BID) | NASAL | Status: DC | PRN

## 2013-08-04 NOTE — ED Notes (Signed)
Pt reports has had runny nose,sinus pressure, watery eyes, productive cough,sneezing x1 month. Pt denies any fevers. Pt reports saw PCP and was prescribed prednisone and has tried OTC claritin,allegra,flonase,zyrtec with no relief. nad noted. Airway patent.

## 2013-08-04 NOTE — Discharge Instructions (Signed)
Allergies °Allergies may happen from anything your body is sensitive to. This may be food, medicines, pollens, chemicals, and nearly anything around you in everyday life that produces allergens. An allergen is anything that causes an allergy producing substance. Heredity is often a factor in causing these problems. This means you may have some of the same allergies as your parents. °Food allergies happen in all age groups. Food allergies are some of the most severe and life threatening. Some common food allergies are cow's milk, seafood, eggs, nuts, wheat, and soybeans. °SYMPTOMS  °· Swelling around the mouth. °· An itchy red rash or hives. °· Vomiting or diarrhea. °· Difficulty breathing. °SEVERE ALLERGIC REACTIONS ARE LIFE-THREATENING. °This reaction is called anaphylaxis. It can cause the mouth and throat to swell and cause difficulty with breathing and swallowing. In severe reactions only a trace amount of food (for example, peanut oil in a salad) may cause death within seconds. °Seasonal allergies occur in all age groups. These are seasonal because they usually occur during the same season every year. They may be a reaction to molds, grass pollens, or tree pollens. Other causes of problems are house dust mite allergens, pet dander, and mold spores. The symptoms often consist of nasal congestion, a runny itchy nose associated with sneezing, and tearing itchy eyes. There is often an associated itching of the mouth and ears. The problems happen when you come in contact with pollens and other allergens. Allergens are the particles in the air that the body reacts to with an allergic reaction. This causes you to release allergic antibodies. Through a chain of events, these eventually cause you to release histamine into the blood stream. Although it is meant to be protective to the body, it is this release that causes your discomfort. This is why you were given anti-histamines to feel better.  If you are unable to  pinpoint the offending allergen, it may be determined by skin or blood testing. Allergies cannot be cured but can be controlled with medicine. °Hay fever is a collection of all or some of the seasonal allergy problems. It may often be treated with simple over-the-counter medicine such as diphenhydramine. Take medicine as directed. Do not drink alcohol or drive while taking this medicine. Check with your caregiver or package insert for child dosages. °If these medicines are not effective, there are many new medicines your caregiver can prescribe. Stronger medicine such as nasal spray, eye drops, and corticosteroids may be used if the first things you try do not work well. Other treatments such as immunotherapy or desensitizing injections can be used if all else fails. Follow up with your caregiver if problems continue. These seasonal allergies are usually not life threatening. They are generally more of a nuisance that can often be handled using medicine. °HOME CARE INSTRUCTIONS  °· If unsure what causes a reaction, keep a diary of foods eaten and symptoms that follow. Avoid foods that cause reactions. °· If hives or rash are present: °· Take medicine as directed. °· You may use an over-the-counter antihistamine (diphenhydramine) for hives and itching as needed. °· Apply cold compresses (cloths) to the skin or take baths in cool water. Avoid hot baths or showers. Heat will make a rash and itching worse. °· If you are severely allergic: °· Following a treatment for a severe reaction, hospitalization is often required for closer follow-up. °· Wear a medic-alert bracelet or necklace stating the allergy. °· You and your family must learn how to give adrenaline or use   an anaphylaxis kit. °· If you have had a severe reaction, always carry your anaphylaxis kit or EpiPen® with you. Use this medicine as directed by your caregiver if a severe reaction is occurring. Failure to do so could have a fatal outcome. °SEEK MEDICAL  CARE IF: °· You suspect a food allergy. Symptoms generally happen within 30 minutes of eating a food. °· Your symptoms have not gone away within 2 days or are getting worse. °· You develop new symptoms. °· You want to retest yourself or your child with a food or drink you think causes an allergic reaction. Never do this if an anaphylactic reaction to that food or drink has happened before. Only do this under the care of a caregiver. °SEEK IMMEDIATE MEDICAL CARE IF:  °· You have difficulty breathing, are wheezing, or have a tight feeling in your chest or throat. °· You have a swollen mouth, or you have hives, swelling, or itching all over your body. °· You have had a severe reaction that has responded to your anaphylaxis kit or an EpiPen®. These reactions may return when the medicine has worn off. These reactions should be considered life threatening. °MAKE SURE YOU:  °· Understand these instructions. °· Will watch your condition. °· Will get help right away if you are not doing well or get worse. °Document Released: 07/05/2002 Document Revised: 08/06/2012 Document Reviewed: 12/10/2007 °[employer]® Patient Information ©2014 [employer]. ° °Allergic Rhinitis °Allergic rhinitis is when the mucous membranes in the nose respond to allergens. Allergens are particles in the air that cause your body to have an allergic reaction. This causes you to release allergic antibodies. Through a chain of events, these eventually cause you to release histamine into the blood stream. Although meant to protect the body, it is this release of histamine that causes your discomfort, such as frequent sneezing, congestion, and an itchy, runny nose.  °CAUSES  °Seasonal allergic rhinitis (hay fever) is caused by pollen allergens that may come from grasses, trees, and weeds. Year-round allergic rhinitis (perennial allergic rhinitis) is caused by allergens such as house dust mites, pet dander, and mold spores.  °SYMPTOMS  °· Nasal stuffiness  (congestion). °· Itchy, runny nose with sneezing and tearing of the eyes. °DIAGNOSIS  °Your health care provider can help you determine the allergen or allergens that trigger your symptoms. If you and your health care provider are unable to determine the allergen, skin or blood testing may be used. °TREATMENT  °Allergic Rhinitis does not have a cure, but it can be controlled by: °· Medicines and allergy shots (immunotherapy). °· Avoiding the allergen. °Hay fever may often be treated with antihistamines in pill or nasal spray forms. Antihistamines block the effects of histamine. There are over-the-counter medicines that may help with nasal congestion and swelling around the eyes. Check with your health care provider before taking or giving this medicine.  °If avoiding the allergen or the medicine prescribed do not work, there are many new medicines your health care provider can prescribe. Stronger medicine may be used if initial measures are ineffective. Desensitizing injections can be used if medicine and avoidance does not work. Desensitization is when a patient is given ongoing shots until the body becomes less sensitive to the allergen. Make sure you follow up with your health care provider if problems continue. °HOME CARE INSTRUCTIONS °It is not possible to completely avoid allergens, but you can reduce your symptoms by taking steps to limit your exposure to them. It helps to know   exactly what you are allergic to so that you can avoid your specific triggers. SEEK MEDICAL CARE IF:   You have a fever.  You develop a cough that does not stop easily (persistent).  You have shortness of breath.  You start wheezing.  Symptoms interfere with normal daily activities. Document Released: 01/04/2001 Document Revised: 01/30/2013 Document Reviewed: 12/17/2012 [facility] Patient Information 2014 [place].
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
## 2013-09-04 ENCOUNTER — Ambulatory Visit: Payer: Medicare Other | Admitting: Family Medicine

## 2014-01-08 ENCOUNTER — Ambulatory Visit (INDEPENDENT_AMBULATORY_CARE_PROVIDER_SITE_OTHER): Payer: Medicare Other | Admitting: Adult Health

## 2014-01-08 ENCOUNTER — Encounter: Payer: Self-pay | Admitting: Adult Health

## 2014-01-08 VITALS — BP 110/64 | Ht 69.0 in | Wt 334.0 lb

## 2014-01-08 DIAGNOSIS — N920 Excessive and frequent menstruation with regular cycle: Secondary | ICD-10-CM | POA: Diagnosis not present

## 2014-01-08 DIAGNOSIS — Z319 Encounter for procreative management, unspecified: Secondary | ICD-10-CM | POA: Insufficient documentation

## 2014-01-08 DIAGNOSIS — N926 Irregular menstruation, unspecified: Secondary | ICD-10-CM | POA: Diagnosis not present

## 2014-01-08 DIAGNOSIS — N921 Excessive and frequent menstruation with irregular cycle: Secondary | ICD-10-CM

## 2014-01-08 HISTORY — DX: Irregular menstruation, unspecified: N92.6

## 2014-01-08 HISTORY — DX: Excessive and frequent menstruation with regular cycle: N92.0

## 2014-01-08 MED ORDER — PRENATAL PLUS 27-1 MG PO TABS: 1.0000 | ORAL_TABLET | Freq: Every day | ORAL | Status: DC
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
## 2014-01-08 NOTE — Patient Instructions (Signed)
Polycystic Ovarian Syndrome Polycystic ovarian syndrome (PCOS) is a common hormonal disorder among women of reproductive age. Most women with PCOS grow many small cysts on their ovaries. PCOS can cause problems with your periods and make it difficult to get pregnant. It can also cause an increased risk of miscarriage with pregnancy. If left untreated, PCOS can lead to serious health problems, such as diabetes and heart disease. CAUSES The cause of PCOS is not fully understood, but genetics may be a factor. SIGNS AND SYMPTOMS   Infrequent or no menstrual periods.   Inability to get pregnant (infertility) because of not ovulating.   Increased growth of hair on the face, chest, stomach, back, thumbs, thighs, or toes.   Acne, oily skin, or dandruff.   Pelvic pain.   Weight gain or obesity, usually carrying extra weight around the waist.   Type 2 diabetes.   High cholesterol.   High blood pressure.   Female-pattern baldness or thinning hair.   Patches of thickened and dark brown or black skin on the neck, arms, breasts, or thighs.   Tiny excess flaps of skin (skin tags) in the armpits or neck area.   Excessive snoring and having breathing stop at times while asleep (sleep apnea).   Deepening of the voice.   Gestational diabetes when pregnant.  DIAGNOSIS  There is no single test to diagnose PCOS.   Your health care provider will:   Take a medical history.   Perform a pelvic exam.   Have ultrasonography done.   Check your female and female hormone levels.   Measure glucose or sugar levels in the blood.   Do other blood tests.   If you are producing too many female hormones, your health care provider will make sure it is from PCOS. At the physical exam, your health care provider will want to evaluate the areas of increased hair growth. Try to allow natural hair growth for a few days before the visit.   During a pelvic exam, the ovaries may be enlarged  or swollen because of the increased number of small cysts. This can be seen more easily by using vaginal ultrasonography or screening to examine the ovaries and lining of the uterus (endometrium) for cysts. The uterine lining may become thicker if you have not been having a regular period.  TREATMENT  Because there is no cure for PCOS, it needs to be managed to prevent problems. Treatments are based on your symptoms. Treatment is also based on whether you want to have a baby or whether you need contraception.  Treatment may include:   Progesterone hormone to start a menstrual period.   Birth control pills to make you have regular menstrual periods.   Medicines to make you ovulate, if you want to get pregnant.   Medicines to control your insulin.   Medicine to control your blood pressure.   Medicine and diet to control your high cholesterol and triglycerides in your blood.  Medicine to reduce excessive hair growth.  Surgery, making small holes in the ovary, to decrease the amount of female hormone production. This is done through a long, lighted tube (laparoscope) placed into the pelvis through a tiny incision in the lower abdomen.  HOME CARE INSTRUCTIONS  Only take over-the-counter or prescription medicine as directed by your health care provider.  Pay attention to the foods you eat and your activity levels. This can help reduce the effects of PCOS.  Keep your weight under control.  Eat foods that are   low in carbohydrate and high in fiber.  Exercise regularly. SEEK MEDICAL CARE IF:  Your symptoms do not get better with medicine.  You have new symptoms. Document Released: 08/05/2004 Document Revised: 01/30/2013 Document Reviewed: 09/27/2012 [facility] Patient Information 2015 [place], [place]. This information is not intended to replace advice given to you by your health care provider. Make sure you discuss any questions you have with your health care provider. Preparing for  Pregnancy Before trying to become pregnant, make an appointment with your health care provider (preconception care). The goal is to help you have a healthy, safe pregnancy. At your first appointment, your health care provider will:   Do a complete physical exam, including a Pap test.  Take a complete medical history.  Give you advice and help you resolve any problems. PRECONCEPTION CHECKLIST Here is a list of the basics to cover with your health care provider at your preconception visit:  Medical history.  Tell your health care provider about any diseases you have had. Many diseases can affect your pregnancy.  Include your partner's medical history and family history.  Make sure you have been tested for sexually transmitted infections (STIs). These can affect your pregnancy. In some cases, they can be passed to your baby. Tell your health care provider about any history of STIs.  Make sure your health care provider knows about any previous problems you have had with conception or pregnancy.  Tell your health care provider about any medicine you take. This includes herbal supplements and over-the-counter medicines.  Make sure all your immunizations are up to date. You may need to make additional appointments.  Ask your health care provider if you need any vaccinations or if there are any you should avoid.  Diet.  It is especially important to eat a healthy, balanced diet with the right nutrients when you are pregnant.  Ask your health care provider to help you get to a healthy weight before pregnancy.  If you are overweight, you are at higher risk for certain complications. These include high blood pressure, diabetes, and preterm birth.  If you are underweight, you are more likely to have a low-birth-weight baby.  Lifestyle.  Tell your health care provider about lifestyle factors such as alcohol use, drug use, or smoking.  Describe any harmful substances you may be exposed to  at work or home. These can include chemicals, pesticides, and radiation.  Mental health.  Let your health care provider know if you have been feeling depressed or anxious.  Let your health care provider know if you have a history of substance abuse.  Let your health care provider know if you do not feel safe at home. HOME INSTRUCTIONS TO PREPARE FOR PREGNANCY Follow your health care provider's advice and instructions.   Keep an accurate record of your menstrual periods. This makes it easier for your health care provider to determine your baby's due date.  Begin taking prenatal vitamins and folic acid supplements daily. Take them as directed by your health care provider.  Eat a balanced diet. Get help from a nutrition counselor if you have questions or need help.  Get regular exercise. Try to be active for at least 30 minutes a day most days of the week.  Quit smoking, if you smoke.  Do not drink alcohol.  Do not take illegal drugs.  Get medical problems, such as diabetes or high blood pressure, under control.  If you have diabetes, make sure you do the following:  Have good  blood sugar control. If you have type 1 diabetes, use multiple daily doses of insulin. Do not use split-dose or premixed insulin.  Have an eye exam by a qualified eye care professional trained in caring for people with diabetes.  Get evaluated by your health care provider for cardiovascular disease.  Get to a healthy weight. If you are overweight or obese, reduce your weight with the help of a qualified health professional such as a Museum/gallery exhibitions officer. Ask your health care provider what the right weight range is for you. HOW DO I KNOW I AM PREGNANT? You may be pregnant if you have been sexually active and you miss your period. Symptoms of early pregnancy include:   Mild cramping.  Very light vaginal bleeding (spotting).  Feeling unusually tired.  Morning sickness. If you have any of these symptoms,  take a home pregnancy test. These tests look for a hormone called human chorionic gonadotropin (hCG) in your urine. Your body begins to make this hormone during early pregnancy. These tests are very accurate. Wait until at least the first day you miss your period to take one. If you get a positive result, call your health care provider to make appointments for prenatal care. WHAT SHOULD I DO IF I BECOME PREGNANT?  Make an appointment with your health care provider by week 12 of your pregnancy at the latest.  Do not smoke. Smoking can be harmful to your baby.  Do not drink alcoholic beverages. Alcohol is related to a number of birth defects.  Avoid toxic odors and chemicals.  You may continue to have sexual intercourse if it does not cause pain or other problems, such as vaginal bleeding. Document Released: 03/24/2008 Document Revised: 08/26/2013 Document Reviewed: 03/18/2013 [facility] Patient Information 2015 [place], [place]. This information is not intended to replace advice given to you by your health care provider. Make sure you discuss any questions you have with your health care provider. Return in 1 week for [place] and see me

## 2014-01-15 ENCOUNTER — Encounter: Payer: Self-pay | Admitting: Adult Health

## 2014-01-15 ENCOUNTER — Ambulatory Visit (INDEPENDENT_AMBULATORY_CARE_PROVIDER_SITE_OTHER): Payer: Medicare Other

## 2014-01-15 ENCOUNTER — Ambulatory Visit (INDEPENDENT_AMBULATORY_CARE_PROVIDER_SITE_OTHER): Payer: Medicare Other | Admitting: Adult Health

## 2014-01-15 VITALS — BP 92/60 | Ht 69.0 in | Wt 326.5 lb

## 2014-01-15 DIAGNOSIS — N920 Excessive and frequent menstruation with regular cycle: Secondary | ICD-10-CM

## 2014-01-15 DIAGNOSIS — N926 Irregular menstruation, unspecified: Secondary | ICD-10-CM

## 2014-01-15 DIAGNOSIS — E282 Polycystic ovarian syndrome: Secondary | ICD-10-CM | POA: Diagnosis not present

## 2014-01-15 DIAGNOSIS — Z319 Encounter for procreative management, unspecified: Secondary | ICD-10-CM

## 2014-01-15 DIAGNOSIS — N921 Excessive and frequent menstruation with irregular cycle: Secondary | ICD-10-CM

## 2014-01-15 HISTORY — DX: Polycystic ovarian syndrome: E28.2

## 2014-01-15 MED ORDER — CLOMIPHENE CITRATE 50 MG PO TABS: 50.0000 mg | ORAL_TABLET | Freq: Every day | ORAL | Status: DC

## 2014-01-15 NOTE — Progress Notes (Signed)
Subjective:     Patient ID: [name], female   DOB: March 17, 1986, 28 y.o.   MRN: [id number]  HPI [name] is a 28 year old black female in for [place].  Review of Systems See HPI Reviewed past medical,surgical, social and family history. Reviewed medications and allergies.     Objective:   Physical Exam BP 92/60  Ht  (1.753 m)  Wt 326 lb 8 oz (148.099 kg)  BMI 48.19 kg/m2  LMP 08/11/2015US reviewed with pt and discussed with Dr Emelda Fear.   Uterus 8.2 x 4.3 x 3.9 cm, anteverted no myometrial masses noted within  Endometrium 12.3 mm, symmetrical, no obvious mass noted within  Right ovary 3.4 x 2.8 x 2.3 cm, PCO appearance (slightly enlarged with multiple follicles noted)  Left ovary 3.4 x 2.7 x 2.5 cm, PCO appearance (slightly enlarged with multiple follicles noted)  No free fluid or adnexal masses noted within the pelvis  Technician Comments:  Anteverted uterus, Endometrial=12.63mm symmetrical, Bilateral PCO appearance, no free fluid or adnexal masses noted within the pelvis  Assessment:     Irregular periods PCO  Desires pregnancy    Plan:     Rx clomid 50 mg #5 take 1 daily x 5 days, start October 1 Sex every other day 7-24  Check progesterone October 21

## 2014-01-15 NOTE — Patient Instructions (Signed)
Take clomid  October 1-5 Sex October 7-24 every other day Return October 21 for labs

## 2014-01-29 ENCOUNTER — Telehealth: Payer: Self-pay | Admitting: *Deleted

## 2014-01-29 NOTE — Telephone Encounter (Signed)
Did not start clomid but did have period 10/1 still check progesterone level as scheduled

## 2014-01-29 NOTE — Telephone Encounter (Signed)
Spoke with pt. Pt started her period on 10/1 and was supposed to start Clomid on 10/1. Pt unsure when to start med. [facility]!! JSY

## 2014-02-12 ENCOUNTER — Ambulatory Visit (INDEPENDENT_AMBULATORY_CARE_PROVIDER_SITE_OTHER): Payer: Medicare Other | Admitting: Adult Health

## 2014-02-12 ENCOUNTER — Encounter: Payer: Self-pay | Admitting: Adult Health

## 2014-02-12 VITALS — BP 110/78 | Ht 69.0 in | Wt 327.0 lb

## 2014-02-12 DIAGNOSIS — Z319 Encounter for procreative management, unspecified: Secondary | ICD-10-CM

## 2014-02-12 DIAGNOSIS — E282 Polycystic ovarian syndrome: Secondary | ICD-10-CM | POA: Diagnosis not present

## 2014-02-12 DIAGNOSIS — N926 Irregular menstruation, unspecified: Secondary | ICD-10-CM | POA: Diagnosis not present

## 2014-02-12 NOTE — Patient Instructions (Signed)
Will talk when labs back  

## 2014-02-12 NOTE — Progress Notes (Signed)
Subjective:     Patient ID: [name], female   DOB: 04/17/1986, 28 y.o.   MRN: [id number]  HPI [name] is a 28 year old black female in for progesterone check.  Review of Systems See HPi Reviewed past medical,surgical, social and family history. Reviewed medications and allergies.     Objective:   Physical Exam BP 110/78  Ht 5\' 9"  (1.753 m)  Wt 327 lb (148.326 kg)  BMI 48.27 kg/m2  LMP 10/02/2015pt took clomid and has had sex, take prenatals or flintstones, and actually had period this month.    Assessment:     Irregular periods PCO Desires pregnancy    Plan:     Check progesterone level today, will talk when back

## 2014-02-13 ENCOUNTER — Telehealth: Payer: Self-pay | Admitting: Adult Health

## 2014-02-13 LAB — PROGESTERONE: PROGESTERONE: 1.6 ng/mL

## 2014-02-13 NOTE — Telephone Encounter (Signed)
Left message that progesterone level 1.6, which means she did not ovulate this month, call me if she starts or not and will increase clomid next month if desires,pt called and is aware.

## 2014-02-24 ENCOUNTER — Telehealth: Payer: Self-pay | Admitting: Adult Health

## 2014-02-24 MED ORDER — CLOMIPHENE CITRATE 50 MG PO TABS: ORAL_TABLET | ORAL | Status: DC

## 2014-02-24 NOTE — Telephone Encounter (Signed)
Started period yesterday, will call in clomid 50 mg take 2, days 3-7 of cycle and check progesterone November 23 at 869 am..Have sex as discussed.

## 2014-03-17 ENCOUNTER — Other Ambulatory Visit: Payer: Medicare Other

## 2014-03-17 DIAGNOSIS — Z319 Encounter for procreative management, unspecified: Secondary | ICD-10-CM

## 2014-03-18 ENCOUNTER — Telehealth: Payer: Self-pay | Admitting: Adult Health

## 2014-03-18 LAB — PROGESTERONE: PROGESTERONE: 7.6 ng/mL

## 2014-03-18 NOTE — Telephone Encounter (Signed)
Pt aware of  Labs, call with period

## 2014-03-24 ENCOUNTER — Telehealth: Payer: Self-pay | Admitting: Adult Health

## 2014-03-24 MED ORDER — CLOMIPHENE CITRATE 50 MG PO TABS: ORAL_TABLET | ORAL | Status: DC

## 2014-03-24 NOTE — Telephone Encounter (Signed)
Started period today, start clomid 50 mg take 3 daily days 3-5 of cycle and check progesterone level 12/21 at 9 am,have sex every other days 7-24.

## 2014-04-14 ENCOUNTER — Other Ambulatory Visit: Payer: Medicare Other

## 2014-04-14 DIAGNOSIS — Z319 Encounter for procreative management, unspecified: Secondary | ICD-10-CM

## 2014-04-15 ENCOUNTER — Telehealth: Payer: Self-pay | Admitting: Adult Health

## 2014-04-15 LAB — PROGESTERONE: Progesterone: 3.6 ng/mL

## 2014-04-15 NOTE — Telephone Encounter (Signed)
Pt aware does not look like she ovulated with 150 mg clomid, make appt with Dr Despina HiddenEure after first of year to discuss

## 2014-04-28 ENCOUNTER — Ambulatory Visit (INDEPENDENT_AMBULATORY_CARE_PROVIDER_SITE_OTHER): Payer: Medicare Other | Admitting: Obstetrics & Gynecology

## 2014-04-28 ENCOUNTER — Encounter: Payer: Self-pay | Admitting: Obstetrics & Gynecology

## 2014-04-28 VITALS — BP 112/60 | Ht 69.0 in | Wt 337.0 lb

## 2014-04-28 DIAGNOSIS — N970 Female infertility associated with anovulation: Secondary | ICD-10-CM | POA: Diagnosis not present
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
## 2014-07-21 ENCOUNTER — Encounter: Payer: Self-pay | Admitting: Family Medicine

## 2014-07-21 ENCOUNTER — Ambulatory Visit (INDEPENDENT_AMBULATORY_CARE_PROVIDER_SITE_OTHER): Payer: Medicare Other | Admitting: Family Medicine

## 2014-07-21 VITALS — BP 114/74 | HR 95 | Resp 16 | Ht 69.0 in | Wt 324.0 lb

## 2014-07-21 DIAGNOSIS — F32A Depression, unspecified: Secondary | ICD-10-CM

## 2014-07-21 DIAGNOSIS — R7309 Other abnormal glucose: Secondary | ICD-10-CM | POA: Diagnosis not present

## 2014-07-21 DIAGNOSIS — E785 Hyperlipidemia, unspecified: Secondary | ICD-10-CM

## 2014-07-21 DIAGNOSIS — E559 Vitamin D deficiency, unspecified: Secondary | ICD-10-CM | POA: Diagnosis not present

## 2014-07-21 DIAGNOSIS — E8881 Metabolic syndrome: Secondary | ICD-10-CM

## 2014-07-21 DIAGNOSIS — F1990 Other psychoactive substance use, unspecified, uncomplicated: Secondary | ICD-10-CM

## 2014-07-21 DIAGNOSIS — F329 Major depressive disorder, single episode, unspecified: Secondary | ICD-10-CM

## 2014-07-21 DIAGNOSIS — Z1159 Encounter for screening for other viral diseases: Secondary | ICD-10-CM

## 2014-07-21 DIAGNOSIS — R7303 Prediabetes: Secondary | ICD-10-CM

## 2014-07-21 DIAGNOSIS — R21 Rash and other nonspecific skin eruption: Secondary | ICD-10-CM | POA: Diagnosis not present

## 2014-07-21 LAB — COMPLETE METABOLIC PANEL WITH GFR
ALT: 12 U/L (ref 0–35)
AST: 14 U/L (ref 0–37)
Albumin: 4.1 g/dL (ref 3.5–5.2)
Alkaline Phosphatase: 62 U/L (ref 39–117)
BUN: 9 mg/dL (ref 6–23)
CHLORIDE: 105 meq/L (ref 96–112)
CO2: 27 meq/L (ref 19–32)
Calcium: 9.3 mg/dL (ref 8.4–10.5)
Creat: 0.91 mg/dL (ref 0.50–1.10)
GFR, EST NON AFRICAN AMERICAN: 84 mL/min
GFR, Est African American: >89 mL/min
Glucose, Bld: 86 mg/dL (ref 70–99)
Potassium: 4.2 mEq/L (ref 3.5–5.3)
Sodium: 138 mEq/L (ref 135–145)
TOTAL PROTEIN: 7 g/dL (ref 6.0–8.3)
Total Bilirubin: 0.4 mg/dL (ref 0.2–1.2)

## 2014-07-21 LAB — LIPID PANEL
Cholesterol: 227 mg/dL — ABNORMAL HIGH (ref 0–200)
HDL: 31 mg/dL — ABNORMAL LOW (ref >=46)
LDL Cholesterol: 167 mg/dL — ABNORMAL HIGH (ref 0–99)
Total CHOL/HDL Ratio: 7.3 Ratio
Triglycerides: 147 mg/dL (ref <150)
VLDL: 29 mg/dL (ref 0–40)

## 2014-07-21 LAB — CBC
HEMATOCRIT: 38.7 % (ref 36.0–46.0)
Hemoglobin: 12.9 g/dL (ref 12.0–15.0)
MCH: 26.4 pg (ref 26.0–34.0)
MCHC: 33.3 g/dL (ref 30.0–36.0)
MCV: 79.1 fL (ref 78.0–100.0)
MPV: 11.1 fL (ref 8.6–12.4)
Platelets: 296 10*3/uL (ref 150–400)
RBC: 4.89 MIL/uL (ref 3.87–5.11)
RDW: 13.6 % (ref 11.5–15.5)
WBC: 7.8 10*3/uL (ref 4.0–10.5)

## 2014-07-21 LAB — TSH: TSH: 2.47 u[IU]/mL (ref 0.350–4.500)

## 2014-07-21 LAB — HEMOGLOBIN A1C
Hgb A1c MFr Bld: 5.7 % — ABNORMAL HIGH (ref <5.7)
MEAN PLASMA GLUCOSE: 117 mg/dL — AB (ref <117)

## 2014-07-21 MED ORDER — DOXYCYCLINE HYCLATE 100 MG PO TABS: 100.0000 mg | ORAL_TABLET | Freq: Two times a day (BID) | ORAL | Status: DC

## 2014-07-21 NOTE — Patient Instructions (Addendum)
Annual physical exam in 3 month, call if you need me before  Fasting labs today  Medications as discussed  Use condoms for safe sex please and congrats on weight loss  Pls reduce drug use , need to quit  You are referred to psychiatry and dermatology, VERY IMPORTANT to keep appts.  Return to wound center/[place] in [place] for wrapping [place] leg , look much better, so may not need this as much, continue weight loss  New med sent for pustules and rash for 1 week only  It is important that you exercise regularly at least 30 minutes 5 times a week. If you develop chest pain, have severe difficulty breathing, or feel very tired, stop exercising immediately and seek medical attention   A healthy diet is rich in fruit, vegetables and whole grains. Poultry fish, nuts and beans are a healthy choice for protein rather then red meat. A low sodium diet and drinking 64 ounces of water daily is generally recommended. Oils and sweet should be limited. Carbohydrates especially for those who are diabetic or overweight, should be limited to 60-45 gram per meal. It is important to eat on a regular schedule, at least 3 times daily. Snacks should be primarily fruits, vegetables or nuts.

## 2014-07-22 ENCOUNTER — Telehealth: Payer: Self-pay | Admitting: Family Medicine

## 2014-07-22 DIAGNOSIS — J309 Allergic rhinitis, unspecified: Secondary | ICD-10-CM

## 2014-07-22 LAB — VITAMIN D 25 HYDROXY (VIT D DEFICIENCY, FRACTURES): Vit D, 25-Hydroxy: 11 ng/mL — ABNORMAL LOW (ref 30–100)

## 2014-07-22 LAB — HIV ANTIBODY (ROUTINE TESTING W REFLEX): HIV: NONREACTIVE

## 2014-07-22 MED ORDER — VENLAFAXINE HCL 75 MG PO TABS: 75.0000 mg | ORAL_TABLET | Freq: Two times a day (BID) | ORAL | Status: DC

## 2014-07-22 MED ORDER — METFORMIN HCL ER 500 MG PO TB24: 500.0000 mg | ORAL_TABLET | Freq: Every day | ORAL | Status: DC

## 2014-07-22 MED ORDER — FLUTICASONE PROPIONATE 50 MCG/ACT NA SUSP: 2.0000 | Freq: Every day | NASAL | Status: DC

## 2014-07-22 NOTE — Telephone Encounter (Signed)
meds refilled 

## 2014-07-25 MED ORDER — VITAMIN D (ERGOCALCIFEROL) 1.25 MG (50000 UNIT) PO CAPS: 50000.0000 [IU] | ORAL_CAPSULE | ORAL | Status: DC

## 2014-08-07 DIAGNOSIS — Z029 Encounter for administrative examinations, unspecified: Secondary | ICD-10-CM

## 2014-09-10 ENCOUNTER — Ambulatory Visit (HOSPITAL_COMMUNITY): Payer: Medicare Other | Admitting: Psychiatry

## 2014-10-02 NOTE — Assessment & Plan Note (Signed)
Updated lab needed treat based on result.

## 2014-10-02 NOTE — Progress Notes (Signed)
[name]     MRN: [id number]      DOB: August 16, 1985   HPI [name] is here for follow up and re-evaluation of chronic medical conditions, medication management and review of any available recent lab and radiology data.  Preventive health is updated, specifically  Cancer screening and Immunization.   Questions or concerns regarding consultations or procedures which the PT has had in the interim are  addressed. The PT denies any adverse reactions to current medications since the last visit.  There are no new concerns.  There are no specific complaints   ROS Denies recent fever or chills. Denies sinus pressure, nasal congestion, ear pain or sore throat. Denies chest congestion, productive cough or wheezing. Denies chest pains, palpitations and leg swelling Denies abdominal pain, nausea, vomiting,diarrhea or constipation.   Denies dysuria, frequency, hesitancy or incontinence. Denies joint pain, swelling and limitation in mobility. Denies headaches, seizures, numbness, or tingling. Denies depression, anxiety or insomnia. C/o rash on palms which she wants treated also wants referral to therapy for lymphedema Working on weight loss with success, still cutting back on marijuana use PE  BP 114/74 mmHg  Pulse 95  Resp 16  Ht  (1.753 m)  Wt 324 lb (146.965 kg)  BMI 47.82 kg/m2  SpO2 97%  Patient alert and oriented and in no cardiopulmonary distress.  HEENT: No facial asymmetry, EOMI,   oropharynx pink and moist.  Neck supple no JVD, no mass.  Chest: Clear to auscultation bilaterally.  CVS: S1, S2 no murmurs, no S3.Regular rate.  ABD: Soft non tender.   Ext: left lower extremity lymphedema  MS: Adequate ROM spine, shoulders, hips and knees.  Skin: mild erythema and nodular rash on palms  Psych: Good eye contact, normal affect. Memory intact not anxious or depressed appearing.  CNS: CN 2-12 intact, power,  normal throughout.no focal deficits  noted.   Assessment & Plan   Rash and nonspecific skin eruption 1 week of antibiotic and refer to dermatology  Prediabetes Improved, pt applauded Patient educated about the importance of limiting  Carbohydrate intake , the need to commit to daily physical activity for a minimum of 30 minutes , and to commit weight loss. The fact that changes in all these areas will reduce or eliminate all together the development of diabetes is stressed.   Diabetic Labs Latest Ref Rng 07/21/2014 05/07/2013 11/05/2012 11/08/2011 11/06/2011  HbA1c <5.7 % 5.7(H) 5.9(H) 5.7(H) - -  Chol 0 - 200 mg/dL 119(J) 478(G) 956(O) - -  HDL >=46 mg/dL 13(Y) 86(V) 78(I) - -  Calc LDL 0 - 99 mg/dL 696(E) 952(W) 413(K) - -  Triglycerides <150 mg/dL 440 102(V) 253(G) - -  Creatinine 0.50 - 1.10 mg/dL 6.44 0.34 7.42 5.95 6.38(V)   BP/Weight 07/21/2014 04/28/2014 02/12/2014 01/15/2014 01/08/2014 08/04/2013 07/25/2013  Systolic BP 114 112 110 92 110 144 116  Diastolic BP 74 60 78 60 64 90 80  Wt. (Lbs) 324 337 327 326.5 334 342 347.08  BMI 47.82 49.74 48.27 48.19 49.3 50.48 51.23   No flowsheet data found.     Hyperlipemia Deteriorated Hyperlipidemia:Low fat diet discussed and encouraged.   Lipid Panel  Lab Results  Component Value Date   CHOL 227* 07/21/2014   HDL 31* 07/21/2014   LDLCALC 167* 07/21/2014   TRIG 147 07/21/2014   CHOLHDL 7.3 07/21/2014         Metabolic syndrome X The increased risk of cardiovascular disease associated with this diagnosis, and the  need to consistently work on lifestyle to change this is discussed. Following  a  heart healthy diet ,commitment to 30 minutes of exercise at least 5 days per week, as well as control of blood sugar and cholesterol , and achieving a healthy weight are all the areas to be addressed .   Drug use Ongoing use , attempting to disconrinue, also needs to re establish with mental health  LYMPHEDEMA Improved, most likely as a result of weight loss, she is  applauded, refer to therapy for ongoing care  Vitamin D deficiency Updated lab needed treat based on result.

## 2014-10-02 NOTE — Assessment & Plan Note (Signed)
Improved, pt applauded Patient educated about the importance of limiting  Carbohydrate intake , the need to commit to daily physical activity for a minimum of 30 minutes , and to commit weight loss. The fact that changes in all these areas will reduce or eliminate all together the development of diabetes is stressed.   Diabetic Labs Latest Ref Rng 07/21/2014 05/07/2013 11/05/2012 11/08/2011 11/06/2011  HbA1c <5.7 % 5.7(H) 5.9(H) 5.7(H) - -  Chol 0 - 200 mg/dL 761(P) 509(T) 267(T) - -  HDL >=46 mg/dL 24(P) 80(D) 98(P) - -  Calc LDL 0 - 99 mg/dL 382(N) 053(Z) 767(H) - -  Triglycerides <150 mg/dL 419 379(K) 240(X) - -  Creatinine 0.50 - 1.10 mg/dL 7.35 3.29 9.24 2.68 3.41(D)   BP/Weight 07/21/2014 04/28/2014 02/12/2014 01/15/2014 01/08/2014 08/04/2013 07/25/2013  Systolic BP 114 112 110 92 110 144 116  Diastolic BP 74 60 78 60 64 90 80  Wt. (Lbs) 324 337 327 326.5 334 342 347.08  BMI 47.82 49.74 48.27 48.19 49.3 50.48 51.23   No flowsheet data found.

## 2014-10-02 NOTE — Assessment & Plan Note (Signed)
1 week of antibiotic and refer to dermatology

## 2014-10-02 NOTE — Assessment & Plan Note (Signed)
The increased risk of cardiovascular disease associated with this diagnosis, and the need to consistently work on lifestyle to change this is discussed. Following  a  heart healthy diet ,commitment to 30 minutes of exercise at least 5 days per week, as well as control of blood sugar and cholesterol , and achieving a healthy weight are all the areas to be addressed .  

## 2014-10-02 NOTE — Assessment & Plan Note (Signed)
Ongoing use , attempting to disconrinue, also needs to re establish with mental health

## 2014-10-02 NOTE — Assessment & Plan Note (Signed)
Deteriorated Hyperlipidemia:Low fat diet discussed and encouraged.   Lipid Panel  Lab Results  Component Value Date   CHOL 227* 07/21/2014   HDL 31* 07/21/2014   LDLCALC 167* 07/21/2014   TRIG 147 07/21/2014   CHOLHDL 7.3 07/21/2014

## 2014-10-02 NOTE — Assessment & Plan Note (Signed)
Improved, most likely as a result of weight loss, she is applauded, refer to therapy for ongoing care

## 2014-12-08 ENCOUNTER — Encounter: Payer: Self-pay | Admitting: Family Medicine

## 2015-01-22 ENCOUNTER — Telehealth: Payer: Self-pay | Admitting: *Deleted

## 2015-01-22 DIAGNOSIS — N920 Excessive and frequent menstruation with regular cycle: Secondary | ICD-10-CM

## 2015-01-22 NOTE — Telephone Encounter (Signed)
Patient called wanting to know if it is necessary for her to have a menstrual or can she have surgery. Please advise [phone]

## 2015-01-23 NOTE — Telephone Encounter (Signed)
[facility], and also put in the referral the need to have a gyne exam and pap, her pap is past due and she missed her recent appt for this, I will sign

## 2015-01-23 NOTE — Addendum Note (Signed)
Addended by: Abner Greenspan on: 01/23/2015 01:35 PM   Modules accepted: Orders

## 2015-01-23 NOTE — Telephone Encounter (Signed)
Wants to know if she can elect to have an ablasion to slow or stop her cycles. If so, wants to be referred to [facility]

## 2015-02-04 ENCOUNTER — Telehealth: Payer: Self-pay

## 2015-02-04 DIAGNOSIS — I890 Lymphedema, not elsewhere classified: Secondary | ICD-10-CM

## 2015-02-04 NOTE — Telephone Encounter (Signed)
Referral entered.  Patient previously seen there

## 2015-02-09 ENCOUNTER — Ambulatory Visit (INDEPENDENT_AMBULATORY_CARE_PROVIDER_SITE_OTHER): Payer: Medicare Other | Admitting: Women's Health

## 2015-02-09 ENCOUNTER — Encounter: Payer: Self-pay | Admitting: Women's Health

## 2015-02-09 VITALS — BP 120/72 | HR 68 | Wt 326.0 lb

## 2015-02-09 DIAGNOSIS — N921 Excessive and frequent menstruation with irregular cycle: Secondary | ICD-10-CM | POA: Diagnosis not present

## 2015-02-09 DIAGNOSIS — Z3202 Encounter for pregnancy test, result negative: Secondary | ICD-10-CM | POA: Diagnosis not present

## 2015-02-09 DIAGNOSIS — Z3009 Encounter for other general counseling and advice on contraception: Secondary | ICD-10-CM | POA: Diagnosis not present

## 2015-02-09 DIAGNOSIS — N946 Dysmenorrhea, unspecified: Secondary | ICD-10-CM

## 2015-02-09 LAB — POCT URINE PREGNANCY: Preg Test, Ur: NEGATIVE

## 2015-02-09 MED ORDER — MISOPROSTOL 200 MCG PO TABS
400.0000 ug | ORAL_TABLET | Freq: Once | ORAL | Status: DC
Start: 2015-02-09 — End: 2015-03-09

## 2015-02-09 NOTE — Patient Instructions (Signed)
NO SEX UNTIL AFTER YOU GET YOUR BIRTH CONTROL   Levonorgestrel intrauterine device (IUD) What is this medicine? LEVONORGESTREL IUD (LEE voe nor jes trel) is a contraceptive (birth control) device. The device is placed inside the uterus by a healthcare professional. It is used to prevent pregnancy and can also be used to treat heavy bleeding that occurs during your period. Depending on the device, it can be used for 3 to 5 years. This medicine may be used for other purposes; ask your health care provider or pharmacist if you have questions. What should I tell my health care provider before I take this medicine? They need to know if you have any of these conditions: -abnormal Pap smear -cancer of the breast, uterus, or cervix -diabetes -endometritis -genital or pelvic infection now or in the past -have more than one sexual partner or your partner has more than one partner -heart disease -history of an ectopic or tubal pregnancy -immune system problems -IUD in place -liver disease or tumor -problems with blood clots or take blood-thinners -use intravenous drugs -uterus of unusual shape -vaginal bleeding that has not been explained -an unusual or allergic reaction to levonorgestrel, other hormones, silicone, or polyethylene, medicines, foods, dyes, or preservatives -pregnant or trying to get pregnant -breast-feeding How should I use this medicine? This device is placed inside the uterus by a health care professional. Talk to your pediatrician regarding the use of this medicine in children. Special care may be needed. Overdosage: If you think you have taken too much of this medicine contact a poison control center or emergency room at once. NOTE: This medicine is only for you. Do not share this medicine with others. What if I miss a dose? This does not apply. What may interact with this medicine? Do not take this medicine with any of the following  medications: -amprenavir -bosentan -fosamprenavir This medicine may also interact with the following medications: -aprepitant -barbiturate medicines for inducing sleep or treating seizures -bexarotene -griseofulvin -medicines to treat seizures like carbamazepine, ethotoin, felbamate, oxcarbazepine, phenytoin, topiramate -modafinil -pioglitazone -rifabutin -rifampin -rifapentine -some medicines to treat HIV infection like atazanavir, indinavir, lopinavir, nelfinavir, tipranavir, ritonavir -St. John's wort -warfarin This list may not describe all possible interactions. Give your health care provider a list of all the medicines, herbs, non-prescription drugs, or dietary supplements you use. Also tell them if you smoke, drink alcohol, or use illegal drugs. Some items may interact with your medicine. What should I watch for while using this medicine? Visit your doctor or health care professional for regular check ups. See your doctor if you or your partner has sexual contact with others, becomes HIV positive, or gets a sexual transmitted disease. This product does not protect you against HIV infection (AIDS) or other sexually transmitted diseases. You can check the placement of the IUD yourself by reaching up to the top of your vagina with clean fingers to feel the threads. Do not pull on the threads. It is a good habit to check placement after each menstrual period. Call your doctor right away if you feel more of the IUD than just the threads or if you cannot feel the threads at all. The IUD may come out by itself. You may become pregnant if the device comes out. If you notice that the IUD has come out use a backup birth control method like condoms and call your health care provider. Using tampons will not change the position of the IUD and are okay to use during your   period. What side effects may I notice from receiving this medicine? Side effects that you should report to your doctor or  health care professional as soon as possible: -allergic reactions like skin rash, itching or hives, swelling of the face, lips, or tongue -fever, flu-like symptoms -genital sores -high blood pressure -no menstrual period for 6 weeks during use -pain, swelling, warmth in the leg -pelvic pain or tenderness -severe or sudden headache -signs of pregnancy -stomach cramping -sudden shortness of breath -trouble with balance, talking, or walking -unusual vaginal bleeding, discharge -yellowing of the eyes or skin Side effects that usually do not require medical attention (report to your doctor or health care professional if they continue or are bothersome): -acne -breast pain -change in sex drive or performance -changes in weight -cramping, dizziness, or faintness while the device is being inserted -headache -irregular menstrual bleeding within first 3 to 6 months of use -nausea This list may not describe all possible side effects. Call your doctor for medical advice about side effects. You may report side effects to FDA at 1-800-FDA-1088. Where should I keep my medicine? This does not apply. NOTE: This sheet is a summary. It may not cover all possible information. If you have questions about this medicine, talk to your doctor, pharmacist, or health care provider.    2016, Elsevier/Gold Standard. (2011-05-12 13:54:04)  
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
## 2015-02-11 ENCOUNTER — Other Ambulatory Visit: Payer: Self-pay | Admitting: Family Medicine

## 2015-02-11 ENCOUNTER — Ambulatory Visit (INDEPENDENT_AMBULATORY_CARE_PROVIDER_SITE_OTHER): Payer: Medicare Other | Admitting: Family Medicine

## 2015-02-11 ENCOUNTER — Encounter: Payer: Self-pay | Admitting: Family Medicine

## 2015-02-11 VITALS — BP 118/80 | HR 85 | Temp 99.0°F | Resp 16 | Ht 69.0 in | Wt 327.0 lb

## 2015-02-11 DIAGNOSIS — E785 Hyperlipidemia, unspecified: Secondary | ICD-10-CM

## 2015-02-11 DIAGNOSIS — J3089 Other allergic rhinitis: Secondary | ICD-10-CM

## 2015-02-11 DIAGNOSIS — R7301 Impaired fasting glucose: Secondary | ICD-10-CM | POA: Diagnosis not present

## 2015-02-11 DIAGNOSIS — R7303 Prediabetes: Secondary | ICD-10-CM

## 2015-02-11 DIAGNOSIS — H547 Unspecified visual loss: Secondary | ICD-10-CM | POA: Insufficient documentation

## 2015-02-11 DIAGNOSIS — Z0000 Encounter for general adult medical examination without abnormal findings: Secondary | ICD-10-CM

## 2015-02-11 DIAGNOSIS — Z23 Encounter for immunization: Secondary | ICD-10-CM

## 2015-02-11 DIAGNOSIS — F32A Depression, unspecified: Secondary | ICD-10-CM

## 2015-02-11 DIAGNOSIS — E559 Vitamin D deficiency, unspecified: Secondary | ICD-10-CM | POA: Diagnosis not present

## 2015-02-11 DIAGNOSIS — F329 Major depressive disorder, single episode, unspecified: Secondary | ICD-10-CM

## 2015-02-11 LAB — HEMOGLOBIN A1C
Hgb A1c MFr Bld: 5.6 % (ref <5.7)
Mean Plasma Glucose: 114 mg/dL (ref <117)

## 2015-02-11 LAB — BASIC METABOLIC PANEL
BUN: 9 mg/dL (ref 7–25)
CALCIUM: 8.9 mg/dL (ref 8.6–10.2)
CO2: 27 mmol/L (ref 20–31)
CREATININE: 0.94 mg/dL (ref 0.50–1.10)
Chloride: 103 mmol/L (ref 98–110)
GLUCOSE: 83 mg/dL (ref 65–99)
Potassium: 4.5 mmol/L (ref 3.5–5.3)
Sodium: 137 mmol/L (ref 135–146)

## 2015-02-11 LAB — LIPID PANEL
CHOL/HDL RATIO: 6.4 ratio — AB (ref <=5.0)
Cholesterol: 206 mg/dL — ABNORMAL HIGH (ref 125–200)
HDL: 32 mg/dL — AB (ref >=46)
LDL CALC: 142 mg/dL — AB (ref <130)
TRIGLYCERIDES: 161 mg/dL — AB (ref <150)
VLDL: 32 mg/dL — AB (ref <30)

## 2015-02-11 MED ORDER — VENLAFAXINE HCL 75 MG PO TABS: 75.0000 mg | ORAL_TABLET | Freq: Two times a day (BID) | ORAL | Status: DC

## 2015-02-11 MED ORDER — METFORMIN HCL ER 500 MG PO TB24: 500.0000 mg | ORAL_TABLET | Freq: Every day | ORAL | Status: DC

## 2015-02-11 MED ORDER — VITAMIN D (ERGOCALCIFEROL) 1.25 MG (50000 UNIT) PO CAPS: 50000.0000 [IU] | ORAL_CAPSULE | ORAL | Status: DC

## 2015-02-11 NOTE — Assessment & Plan Note (Signed)
Resume effexor, though doping well based on improved home environment

## 2015-02-11 NOTE — Assessment & Plan Note (Signed)
Reduced left eye vision, wears glasses, refer to Dr   Lita MainsHaines

## 2015-02-11 NOTE — Patient Instructions (Addendum)
F/u in 4 months, call if you need me sooner  Labs today   You are referred to Dr Lita MainsHaines for eye exam  Please work on good  health habits so that your health will improve. 1. Commitment to daily physical activity for 30 to 60  minutes, if you are able to do this.  2. Commitment to wise food choices. Aim for half of your  food intake to be vegetable and fruit, one quarter starchy foods, and one quarter protein. Try to eat on a regular schedule  3 meals per day, snacking between meals should be limited to vegetables or fruits or small portions of nuts. 64 ounces of water per day is generally recommended, unless you have specific health conditions, like heart failure or kidney failure where you will need to limit fluid intake.  3. Commitment to sufficient and a  good quality of physical and mental rest daily, generally between 6 to 8 hours per day.  WITH PERSISTANCE AND PERSEVERANCE, THE IMPOSSIBLE , BECOMES THE NORM!   Medications are sent in, flonase, weekly Vit D, effexor and metformin  Thanks for choosing [facility], we consider it a privelige to serve you.

## 2015-02-11 NOTE — Assessment & Plan Note (Addendum)
Pt refused vaccine, this was not given after she had stated she would take the vaccine

## 2015-02-11 NOTE — Progress Notes (Signed)
Subjective:    Patient ID: [name], female    DOB: [birth year], 29 y.o.   MRN: [id number]  HPI  Preventive Screening-Counseling & Management   Patient present here today for a Medicare annual wellness visit. C/o 4 day h/o uncontrolled aleergy symptioms , and laso needs flu shot  Current Problems (verified)   Medications Prior to Visit Allergies (verified)   PAST HISTORY  Family History  Social History Currently in a relationship with female partener for 2 years, rasing niece who is 14 y/o, no drug use Disabled due to chronic left lymphedema, and cellulitis   Risk Factors  Current exercise habits:  walks daIly for 15 mons Dietary issues discussed:water only, reduce carbs and protein portion size   Cardiac risk factors:   Depression Screen  (Note: if answer to either of the following is "Yes", a more complete depression screening is indicated)   Over the past two weeks, have you felt down, depressed or hopeless? No  Over the past two weeks, have you felt little interest or pleasure in doing things? No  Have you lost interest or pleasure in daily life? No  Do you often feel hopeless? No  Do you cry easily over simple problems? No   Activities of Daily Living  In your present state of health, do you have any difficulty performing the following activities?  Driving?: No Managing money?: No Feeding yourself?:No Getting from bed to chair?:No Climbing a flight of stairs?:yes at times due to chronic left lymphedema Preparing food and eating?:No Bathing or showering?:No Getting dressed?:No Getting to the toilet?:No Using the toilet?:No Moving around from place to place?: at times due to left leg swelling Fall Risk Assessment In the past year have you fallen or had a near fall?:No Are you currently taking any medications that make you dizzy?:No   Hearing Difficulties: No Do you often ask people to speak up or repeat themselves?:No Do you experience ringing or  noises in your ears?:No Do you have difficulty understanding soft or whispered voices?:No  Cognitive Testing  Alert? Yes Normal Appearance?Yes  Oriented to person? Yes Place? Yes  Time? Yes  Displays appropriate judgment?Yes  Can read the correct time from a watch face? yes Are you having problems remembering things?No  Advanced Directives have been discussed with the patient?Yes    List the Names of Other Physician/Practitioners you currently use:    Indicate any recent Medical Services you may have received from other than [facility] providers in the past year (date may be approximate).   Assessment:    Annual Wellness Exam   Plan:     Medicare Attestation  I have personally reviewed:  The patient's medical and social history  Their use of alcohol, tobacco or illicit drugs  Their current medications and supplements  The patient's functional ability including ADLs,fall risks, home safety risks, cognitive, and hearing and visual impairment  Diet and physical activities  Evidence for depression or mood disorders  The patient's weight, height, BMI, and visual acuity have been recorded in the chart. I have made referrals, counseling, and provided education to the patient based on review of the above and I have provided the patient with a written personalized care plan for preventive services.     Review of Systems     Objective:   Physical Exam  BP 118/80 mmHg  Pulse 85  Temp(Src) 99 F (37.2 C) (Oral)  Resp 16  Ht  (1.753 m)  Wt 327 lb (148.326 kg)  BMI 48.27 kg/m2  SpO2 99%  LMP 12/11/2014  HEENT: No sinus tenderness, TM clear bilaterally, erythema and edema of nasal mucoasa, conjunctival injection Chest CTA      Assessment & Plan:  Medicare annual wellness visit, initial Annual exam as documented. Counseling done  re healthy lifestyle involving commitment to 150 minutes exercise per week, heart healthy diet, and attaining healthy weight.The importance of  adequate sleep also discussed. Regular seat belt use and home safety, is also discussed. Changes in health habits are decided on by the patient with goals and time frames  set for achieving them. Immunization and cancer screening needs are specifically addressed at this visit.   Allergic rhinitis Uncontrolled, resume flonase  Prediabetes Patient educated about the importance of limiting  Carbohydrate intake , the need to commit to daily physical activity for a minimum of 30 minutes , and to commit weight loss. The fact that changes in all these areas will reduce or eliminate all together the development of diabetes is stressed.   Diabetic Labs Latest Ref Rng 07/21/2014 05/07/2013 11/05/2012 11/08/2011 11/06/2011  HbA1c <5.7 % 5.7(H) 5.9(H) 5.7(H) - -  Chol 0 - 200 mg/dL 161(W227(H) 960(A214(H) 540(J224(H) - -  HDL >=46 mg/dL 81(X31(L) 91(Y33(L) 78(G32(L) - -  Calc LDL 0 - 99 mg/dL 956(O167(H) 130(Q141(H) 657(Q140(H) - -  Triglycerides <150 mg/dL 469147 629(B198(H) 284(X259(H) - -  Creatinine 0.50 - 1.10 mg/dL 3.240.91 4.010.93 0.270.78 2.531.01 6.64(Q1.15(H)   BP/Weight 02/11/2015 02/09/2015 07/21/2014 04/28/2014 02/12/2014 01/15/2014 01/08/2014  Systolic BP 118 120 114 112 110 92 110  Diastolic BP 80 72 74 60 78 60 64  Wt. (Lbs) 327 326 324 337 327 326.5 334  BMI 48.27 48.12 47.82 49.74 48.27 48.19 49.3   No flowsheet data found. Resume metformin    Depression Resume effexor, though doping well based on improved home environment  Morbid obesity Deteriorated. Patient re-educated about  the importance of commitment to a  minimum of 150 minutes of exercise per week.  The importance of healthy food choices with portion control discussed. Encouraged to start a food diary, count calories and to consider  joining a support group. Sample diet sheets offered. Goals set by the patient for the next several months.   Weight /BMI 02/11/2015 02/09/2015 07/21/2014  WEIGHT 327 lb 326 lb 324 lb  HEIGHT 5\' 9"  - 5\' 9"   BMI 48.27 kg/m2 48.12 kg/m2 47.82 kg/m2    Current  exercise per week 90 minutes.   Need for prophylactic vaccination and inoculation against influenza After obtaining informed consent, the vaccine is  administered by LPN.   Reduced vision Reduced left eye vision, wears glasses, refer to Dr   Lita MainsHaines

## 2015-02-11 NOTE — Assessment & Plan Note (Signed)
Uncontrolled, resume flonase 

## 2015-02-11 NOTE — Assessment & Plan Note (Signed)
Patient educated about the importance of limiting  Carbohydrate intake , the need to commit to daily physical activity for a minimum of 30 minutes , and to commit weight loss. The fact that changes in all these areas will reduce or eliminate all together the development of diabetes is stressed.   Diabetic Labs Latest Ref Rng 07/21/2014 05/07/2013 11/05/2012 11/08/2011 11/06/2011  HbA1c <5.7 % 5.7(H) 5.9(H) 5.7(H) - -  Chol 0 - 200 mg/dL 914(N227(H) 829(F214(H) 621(H224(H) - -  HDL >=46 mg/dL 08(M31(L) 57(Q33(L) 46(N32(L) - -  Calc LDL 0 - 99 mg/dL 629(B167(H) 284(X141(H) 324(M140(H) - -  Triglycerides <150 mg/dL 010147 272(Z198(H) 366(Y259(H) - -  Creatinine 0.50 - 1.10 mg/dL 4.030.91 4.740.93 2.590.78 5.631.01 8.75(I1.15(H)   BP/Weight 02/11/2015 02/09/2015 07/21/2014 04/28/2014 02/12/2014 01/15/2014 01/08/2014  Systolic BP 118 120 114 112 110 92 110  Diastolic BP 80 72 74 60 78 60 64  Wt. (Lbs) 327 326 324 337 327 326.5 334  BMI 48.27 48.12 47.82 49.74 48.27 48.19 49.3   No flowsheet data found. Resume metformin

## 2015-02-11 NOTE — Assessment & Plan Note (Signed)

## 2015-02-11 NOTE — Addendum Note (Signed)
Addended by: Abner GreenspanHUDY, Tyhesha Dutson H on: 02/11/2015 09:53 AM   Modules accepted: Orders

## 2015-02-11 NOTE — Assessment & Plan Note (Signed)
Deteriorated. Patient re-educated about  the importance of commitment to a  minimum of 150 minutes of exercise per week.  The importance of healthy food choices with portion control discussed. Encouraged to start a food diary, count calories and to consider  joining a support group. Sample diet sheets offered. Goals set by the patient for the next several months.   Weight /BMI 02/11/2015 02/09/2015 07/21/2014  WEIGHT 327 lb 326 lb 324 lb  HEIGHT 5\' 9"  - 5\' 9"   BMI 48.27 kg/m2 48.12 kg/m2 47.82 kg/m2    Current exercise per week 90 minutes.

## 2015-02-12 ENCOUNTER — Other Ambulatory Visit: Payer: Self-pay

## 2015-02-12 DIAGNOSIS — E119 Type 2 diabetes mellitus without complications: Secondary | ICD-10-CM | POA: Diagnosis not present

## 2015-02-12 LAB — VITAMIN D 25 HYDROXY (VIT D DEFICIENCY, FRACTURES): VIT D 25 HYDROXY: 14 ng/mL — AB (ref 30–100)

## 2015-02-12 MED ORDER — VITAMIN D (ERGOCALCIFEROL) 1.25 MG (50000 UNIT) PO CAPS: 50000.0000 [IU] | ORAL_CAPSULE | ORAL | Status: DC

## 2015-02-18 DIAGNOSIS — I890 Lymphedema, not elsewhere classified: Secondary | ICD-10-CM | POA: Diagnosis not present

## 2015-02-19 ENCOUNTER — Telehealth: Payer: Self-pay | Admitting: *Deleted

## 2015-02-19 ENCOUNTER — Other Ambulatory Visit: Payer: Medicare Other

## 2015-02-19 ENCOUNTER — Other Ambulatory Visit: Payer: Self-pay | Admitting: Obstetrics and Gynecology

## 2015-02-19 ENCOUNTER — Ambulatory Visit (INDEPENDENT_AMBULATORY_CARE_PROVIDER_SITE_OTHER): Payer: Medicare Other | Admitting: Advanced Practice Midwife

## 2015-02-19 DIAGNOSIS — Z30014 Encounter for initial prescription of intrauterine contraceptive device: Secondary | ICD-10-CM | POA: Diagnosis not present

## 2015-02-19 DIAGNOSIS — Z3200 Encounter for pregnancy test, result unknown: Secondary | ICD-10-CM | POA: Diagnosis not present

## 2015-02-19 DIAGNOSIS — N926 Irregular menstruation, unspecified: Secondary | ICD-10-CM | POA: Diagnosis not present

## 2015-02-19 DIAGNOSIS — N9489 Other specified conditions associated with female genital organs and menstrual cycle: Secondary | ICD-10-CM | POA: Diagnosis not present

## 2015-02-19 DIAGNOSIS — Z331 Pregnant state, incidental: Secondary | ICD-10-CM

## 2015-02-19 LAB — BETA HCG QUANT (REF LAB): hCG Quant: 159 m[IU]/mL

## 2015-02-19 NOTE — Telephone Encounter (Signed)
Pt plans to keep the baby.  To make dating US appt 2-3 weeks

## 2015-02-19 NOTE — Progress Notes (Signed)
Pt came this am for HCG in anticipation of a IUD this afternoon. Pt has PCOS and LMP in August. Last sex 10/17.  HCG 159.  Pt was prescribed Cytotec, which she forgot to take. Despite having tried to conceive last year with Clomid, not sure if she wants a baby. Had resigned to not having kids.  Pt will let us know what she decides to do.

## 2015-02-19 NOTE — Telephone Encounter (Signed)
[place] to talk to [place]. JSY

## 2015-02-21 ENCOUNTER — Emergency Department (HOSPITAL_COMMUNITY)
Admission: EM | Admit: 2015-02-21 | Discharge: 2015-02-21 | Disposition: A | Discharge status: Home / Self Care | Payer: Medicare Other | Attending: Emergency Medicine | Admitting: Emergency Medicine

## 2015-02-21 DIAGNOSIS — Z3201 Encounter for pregnancy test, result positive: Secondary | ICD-10-CM | POA: Insufficient documentation

## 2015-02-21 DIAGNOSIS — O99341 Other mental disorders complicating pregnancy, first trimester: Secondary | ICD-10-CM | POA: Diagnosis not present

## 2015-02-21 DIAGNOSIS — Z3490 Encounter for supervision of normal pregnancy, unspecified, unspecified trimester: Secondary | ICD-10-CM

## 2015-02-21 DIAGNOSIS — F329 Major depressive disorder, single episode, unspecified: Secondary | ICD-10-CM | POA: Diagnosis not present

## 2015-02-21 DIAGNOSIS — Z79899 Other long term (current) drug therapy: Secondary | ICD-10-CM | POA: Diagnosis not present

## 2015-02-21 DIAGNOSIS — O99281 Endocrine, nutritional and metabolic diseases complicating pregnancy, first trimester: Secondary | ICD-10-CM | POA: Insufficient documentation

## 2015-02-21 DIAGNOSIS — Z8679 Personal history of other diseases of the circulatory system: Secondary | ICD-10-CM | POA: Diagnosis not present

## 2015-02-21 DIAGNOSIS — R2242 Localized swelling, mass and lump, left lower limb: Secondary | ICD-10-CM | POA: Insufficient documentation

## 2015-02-21 DIAGNOSIS — O9989 Other specified diseases and conditions complicating pregnancy, childbirth and the puerperium: Secondary | ICD-10-CM | POA: Insufficient documentation

## 2015-02-21 DIAGNOSIS — Z8742 Personal history of other diseases of the female genital tract: Secondary | ICD-10-CM | POA: Diagnosis not present

## 2015-02-21 DIAGNOSIS — Z87891 Personal history of nicotine dependence: Secondary | ICD-10-CM | POA: Diagnosis not present

## 2015-02-21 DIAGNOSIS — Z862 Personal history of diseases of the blood and blood-forming organs and certain disorders involving the immune mechanism: Secondary | ICD-10-CM | POA: Diagnosis not present

## 2015-02-21 DIAGNOSIS — Z3200 Encounter for pregnancy test, result unknown: Secondary | ICD-10-CM | POA: Diagnosis present

## 2015-02-21 DIAGNOSIS — Z880 Allergy status to penicillin: Secondary | ICD-10-CM | POA: Diagnosis not present

## 2015-02-21 DIAGNOSIS — Z3A01 Less than 8 weeks gestation of pregnancy: Secondary | ICD-10-CM | POA: Insufficient documentation

## 2015-02-21 LAB — HCG, QUANTITATIVE, PREGNANCY: hCG, Beta Chain, Quant, S: 550 m[IU]/mL — ABNORMAL HIGH
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
## 2015-02-21 NOTE — ED Notes (Signed)
I am [redacted] weeks pregnant, I was prescribed misoprostol 200 mcg and was told to take the medication if I wanted to end the pregnancy.  I took the pills yesterday and I am wanting to find out if I am still pregnant.  I have not had any bleeding or cramping per pt.

## 2015-02-21 NOTE — Discharge Instructions (Signed)
Your pregnancy tests (quantitative hCG) is 550, consistent with 2-3 weeks of pregnancy. You may want to give the Cytotec about 24-48 more hours. At this point it may be beneficial to have your pregnancy test rechecked by your primary physician, [facility]

## 2015-02-26 ENCOUNTER — Telehealth: Payer: Self-pay | Admitting: Advanced Practice Midwife

## 2015-02-26 NOTE — Telephone Encounter (Signed)
Pt informed per Cathie BeamsFran Cresenzo-Dishmon, CNM, pt can contact abortion clinic of her choice. Pt verbalized understanding.

## 2015-02-26 NOTE — Telephone Encounter (Signed)
Pt may contact abortion clinic of her choice.

## 2015-02-26 NOTE — Telephone Encounter (Signed)
Pt states was to get the IUD inserted on 02/19/2015 but QHCG was 159. Pt states she had been prescribed Cytotec for IUD insertion but did not take it until 02/20/2015. Pt states she has had no bleeding/cramping. Pt states she has decided she is not financial able to have a baby now. What does she need to do?

## 2015-03-03 DIAGNOSIS — I890 Lymphedema, not elsewhere classified: Secondary | ICD-10-CM | POA: Diagnosis not present

## 2015-03-05 ENCOUNTER — Other Ambulatory Visit: Payer: Self-pay | Admitting: Obstetrics and Gynecology

## 2015-03-05 DIAGNOSIS — I890 Lymphedema, not elsewhere classified: Secondary | ICD-10-CM | POA: Diagnosis not present

## 2015-03-05 DIAGNOSIS — O3680X0 Pregnancy with inconclusive fetal viability, not applicable or unspecified: Secondary | ICD-10-CM

## 2015-03-09 ENCOUNTER — Ambulatory Visit (INDEPENDENT_AMBULATORY_CARE_PROVIDER_SITE_OTHER): Payer: Medicare Other | Admitting: Obstetrics and Gynecology

## 2015-03-09 ENCOUNTER — Encounter: Payer: Self-pay | Admitting: Obstetrics and Gynecology

## 2015-03-09 ENCOUNTER — Other Ambulatory Visit: Payer: Self-pay | Admitting: Obstetrics and Gynecology

## 2015-03-09 ENCOUNTER — Ambulatory Visit (INDEPENDENT_AMBULATORY_CARE_PROVIDER_SITE_OTHER): Payer: Medicare Other

## 2015-03-09 VITALS — BP 90/60 | HR 88 | Ht 69.0 in | Wt 323.0 lb

## 2015-03-09 DIAGNOSIS — O3680X0 Pregnancy with inconclusive fetal viability, not applicable or unspecified: Secondary | ICD-10-CM

## 2015-03-09 DIAGNOSIS — Z1389 Encounter for screening for other disorder: Secondary | ICD-10-CM

## 2015-03-09 DIAGNOSIS — O200 Threatened abortion: Secondary | ICD-10-CM

## 2015-03-09 DIAGNOSIS — Z331 Pregnant state, incidental: Secondary | ICD-10-CM

## 2015-03-09 DIAGNOSIS — O021 Missed abortion: Secondary | ICD-10-CM | POA: Insufficient documentation

## 2015-03-09 LAB — POCT URINALYSIS DIPSTICK
Glucose, UA: NEGATIVE
KETONES UA: NEGATIVE
LEUKOCYTES UA: NEGATIVE
Nitrite, UA: NEGATIVE
PROTEIN UA: NEGATIVE
RBC UA: NEGATIVE

## 2015-03-09 NOTE — Progress Notes (Addendum)
US 6wks GS,with three simple cystic areas adjacent to GS,no ys or fetal pole seen,located mid to lower uterus,2 x 1.9 x 1.6 cm corpus luteum rt ov,limited view of lt ov,no free fluid seen,pt will see Dr. Emelda FearFerguson after ultrasound.

## 2015-03-09 NOTE — Progress Notes (Signed)
Patient ID: [name], female   DOB: 12/19/1985, 29 y.o.   MRN: [id number] Pt here today for an US and then discuss findings with Dr. Emelda FearFerguson.
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
## 2015-03-10 DIAGNOSIS — I890 Lymphedema, not elsewhere classified: Secondary | ICD-10-CM | POA: Diagnosis not present

## 2015-03-12 ENCOUNTER — Other Ambulatory Visit: Payer: Self-pay | Admitting: Obstetrics and Gynecology

## 2015-03-12 ENCOUNTER — Other Ambulatory Visit: Payer: Self-pay | Admitting: *Deleted

## 2015-03-12 DIAGNOSIS — O3680X0 Pregnancy with inconclusive fetal viability, not applicable or unspecified: Secondary | ICD-10-CM

## 2015-03-13 DIAGNOSIS — I890 Lymphedema, not elsewhere classified: Secondary | ICD-10-CM | POA: Diagnosis not present

## 2015-03-16 ENCOUNTER — Other Ambulatory Visit: Payer: Self-pay | Admitting: Obstetrics and Gynecology

## 2015-03-16 ENCOUNTER — Ambulatory Visit (INDEPENDENT_AMBULATORY_CARE_PROVIDER_SITE_OTHER): Payer: Medicare Other

## 2015-03-16 ENCOUNTER — Encounter: Payer: Self-pay | Admitting: Obstetrics and Gynecology

## 2015-03-16 ENCOUNTER — Telehealth: Payer: Self-pay | Admitting: *Deleted

## 2015-03-16 ENCOUNTER — Telehealth: Payer: Self-pay | Admitting: Obstetrics & Gynecology

## 2015-03-16 ENCOUNTER — Ambulatory Visit (INDEPENDENT_AMBULATORY_CARE_PROVIDER_SITE_OTHER): Payer: Medicare Other | Admitting: Obstetrics and Gynecology

## 2015-03-16 VITALS — BP 122/64 | Ht 69.0 in | Wt 329.0 lb

## 2015-03-16 DIAGNOSIS — O3680X0 Pregnancy with inconclusive fetal viability, not applicable or unspecified: Secondary | ICD-10-CM

## 2015-03-16 DIAGNOSIS — O200 Threatened abortion: Secondary | ICD-10-CM

## 2015-03-16 DIAGNOSIS — O021 Missed abortion: Secondary | ICD-10-CM

## 2015-03-16 DIAGNOSIS — I890 Lymphedema, not elsewhere classified: Secondary | ICD-10-CM | POA: Diagnosis not present

## 2015-03-16 MED ORDER — MISOPROSTOL 200 MCG PO TABS: 800.0000 ug | ORAL_TABLET | Freq: Once | ORAL | Status: DC

## 2015-03-16 NOTE — Progress Notes (Addendum)
US TV:  5+3wks fetal pole w/no fht seen,large irregular YS,GS is located mid/lus with three simple cystic areas adjacent to GS,normal rt ov with a 1.9 x 1.5 x 1.8 cm complex corpus luteal cyst,unable to see lt ov

## 2015-03-16 NOTE — Telephone Encounter (Signed)
Pt Mom, Stanton KidneyDebra, states her daughter has been crying and very upset about "losing the baby" is there anything that she can take and help with depression. Informed would need to have [place] call our office, Mom not on HIPAA policy form. Verbalized understanding and stated would have pt call the office.

## 2015-03-16 NOTE — Patient Instructions (Signed)
Miscarriage °A miscarriage is the loss of an unborn baby (fetus) before the 20th week of pregnancy. The cause is often unknown.  °HOME CARE °· You may need to stay in bed (bed rest), or you may be able to do light activity. Go about activity as told by your doctor. °· Have help at home. °· Write down how many pads you use each day. Write down how soaked they are. °· Do not use tampons. Do not wash out your vagina (douche) or have sex (intercourse) until your doctor approves. °· Only take medicine as told by your doctor. °· Do not take aspirin. °· Keep all doctor visits as told. °· If you or your partner have problems with grieving, talk to your doctor. You can also try counseling. Give yourself time to grieve before trying to get pregnant again. °GET HELP RIGHT AWAY IF: °· You have bad cramps or pain in your back or belly (abdomen). °· You have a fever. °· You pass large clumps of blood (clots) from your vagina that are walnut-sized or larger. Save the clumps for your doctor to see. °· You pass large amounts of tissue from your vagina. Save the tissue for your doctor to see. °· You have more bleeding. °· You have thick, bad-smelling fluid (discharge) coming from the vagina. °· You get lightheaded, weak, or you pass out (faint). °· You have chills. °MAKE SURE YOU: °· Understand these instructions. °· Will watch your condition. °· Will get help right away if you are not doing well or get worse. °  °This information is not intended to replace advice given to you by your health care provider. Make sure you discuss any questions you have with your health care provider. °  °Document Released: 07/04/2011 Document Reviewed: 07/04/2011 °Elsevier Interactive Patient Education ©2016 [employer] ° °

## 2015-03-16 NOTE — Telephone Encounter (Signed)
Pt called stating that she was returning Chrystal's phone call. Please contact pt

## 2015-03-16 NOTE — Progress Notes (Signed)
Patient ID: [name], female   DOB: 11/22/1985, 29 y.o.   MRN: [id number] Pt here today for US and then discuss results with Dr. Emelda FearFerguson.

## 2015-03-16 NOTE — Telephone Encounter (Signed)
Pt informed her Mom had called today requesting medication for the pt for depression since pt is miscarrying. Pt states she was given Cytotec by Dr.Ferguson today for MAB. Pt states she feels she can get through without taking anything for depression right now will see how she feels tomorrow. Pt informed [facility] call our office back for pain [facility] Pt verbalized understanding.
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
## 2015-03-20 NOTE — Progress Notes (Signed)
Cancelled appt

## 2015-03-23 ENCOUNTER — Ambulatory Visit (INDEPENDENT_AMBULATORY_CARE_PROVIDER_SITE_OTHER): Payer: Medicare Other | Admitting: Obstetrics and Gynecology

## 2015-03-23 ENCOUNTER — Telehealth: Payer: Self-pay | Admitting: Family Medicine

## 2015-03-23 ENCOUNTER — Encounter: Payer: Self-pay | Admitting: Obstetrics and Gynecology

## 2015-03-23 VITALS — BP 126/82 | Ht 69.0 in | Wt 326.0 lb

## 2015-03-23 DIAGNOSIS — N938 Other specified abnormal uterine and vaginal bleeding: Secondary | ICD-10-CM

## 2015-03-23 DIAGNOSIS — O021 Missed abortion: Secondary | ICD-10-CM | POA: Diagnosis not present

## 2015-03-23 DIAGNOSIS — R1030 Lower abdominal pain, unspecified: Secondary | ICD-10-CM | POA: Diagnosis not present

## 2015-03-23 MED ORDER — METFORMIN HCL ER 500 MG PO TB24: 500.0000 mg | ORAL_TABLET | Freq: Every day | ORAL | Status: DC

## 2015-03-23 MED ORDER — VENLAFAXINE HCL 75 MG PO TABS: 75.0000 mg | ORAL_TABLET | Freq: Two times a day (BID) | ORAL | Status: DC

## 2015-03-23 MED ORDER — VITAMIN D (ERGOCALCIFEROL) 1.25 MG (50000 UNIT) PO CAPS: 50000.0000 [IU] | ORAL_CAPSULE | ORAL | Status: DC

## 2015-03-23 NOTE — Patient Instructions (Signed)
Check a home pregnancy test in a week, please let us know if the test is still positive. Return in 2 wk for discussion of PCOS and its effect on ovulation and pregnancy

## 2015-03-23 NOTE — Telephone Encounter (Signed)
Patient is asking for a refill on medications venlafaxine (EFFEXOR) 75 MG tablet, metFORMIN (GLUCOPHAGE-XR) 500 MG 24 hr tablet ,Vitamin D, Ergocalciferol, (DRISDOL) 50000 UNITS CAPS please advise?

## 2015-03-23 NOTE — Telephone Encounter (Signed)
meds refilled 

## 2015-03-23 NOTE — Progress Notes (Signed)
Patient ID: [name], female   DOB: 12/06/1985, 29 y.o.   MRN: [id number] Pt here today for follow up. Pt states that she started bleeding on the 21st and is still having some light bleeding now. Pt states that she has had some cramping.
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
## 2015-03-24 DIAGNOSIS — I890 Lymphedema, not elsewhere classified: Secondary | ICD-10-CM | POA: Diagnosis not present

## 2015-03-26 DIAGNOSIS — I890 Lymphedema, not elsewhere classified: Secondary | ICD-10-CM | POA: Diagnosis not present

## 2015-04-03 DIAGNOSIS — I890 Lymphedema, not elsewhere classified: Secondary | ICD-10-CM | POA: Diagnosis not present

## 2015-04-06 ENCOUNTER — Ambulatory Visit (INDEPENDENT_AMBULATORY_CARE_PROVIDER_SITE_OTHER): Payer: Medicare Other | Admitting: Obstetrics and Gynecology

## 2015-04-06 ENCOUNTER — Ambulatory Visit: Payer: Medicare Other | Admitting: Obstetrics and Gynecology

## 2015-04-06 ENCOUNTER — Encounter: Payer: Self-pay | Admitting: Obstetrics and Gynecology

## 2015-04-06 VITALS — BP 128/86 | Ht 69.0 in | Wt 324.0 lb

## 2015-04-06 DIAGNOSIS — O039 Complete or unspecified spontaneous abortion without complication: Secondary | ICD-10-CM | POA: Diagnosis not present

## 2015-04-06 DIAGNOSIS — Z6841 Body Mass Index (BMI) 40.0 and over, adult: Secondary | ICD-10-CM

## 2015-04-06 NOTE — Progress Notes (Signed)
Patient ID: [name], female   DOB: 02/08/1986, 29 y.o.   MRN: [id number] Pt here today for follow up visit. Pt denies any problems or concerns at this time.
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
## 2015-04-10 DIAGNOSIS — I890 Lymphedema, not elsewhere classified: Secondary | ICD-10-CM | POA: Diagnosis not present

## 2015-04-17 DIAGNOSIS — I890 Lymphedema, not elsewhere classified: Secondary | ICD-10-CM | POA: Diagnosis not present

## 2015-06-04 ENCOUNTER — Encounter: Payer: Self-pay | Admitting: Family Medicine

## 2015-06-04 ENCOUNTER — Ambulatory Visit: Payer: Self-pay | Admitting: Family Medicine

## 2015-07-06 ENCOUNTER — Ambulatory Visit: Payer: Medicare Other | Admitting: Obstetrics and Gynecology

## 2015-08-20 ENCOUNTER — Telehealth: Payer: Self-pay

## 2015-08-24 NOTE — Telephone Encounter (Signed)
Work in today is fine

## 2015-08-24 NOTE — Telephone Encounter (Signed)
Couldn't come today but made an appt for 8:15 tomorrow. States she is feeling better than she was

## 2015-08-25 ENCOUNTER — Encounter: Payer: Self-pay | Admitting: Family Medicine

## 2015-08-25 ENCOUNTER — Ambulatory Visit: Payer: Self-pay | Admitting: Family Medicine

## 2016-01-15 ENCOUNTER — Other Ambulatory Visit (HOSPITAL_COMMUNITY)
Admission: RE | Admit: 2016-01-15 | Discharge: 2016-01-15 | Disposition: A | Discharge status: Home / Self Care | Payer: Medicare Other | Source: Ambulatory Visit | Attending: Adult Health | Admitting: Adult Health

## 2016-01-15 ENCOUNTER — Encounter: Payer: Self-pay | Admitting: Adult Health

## 2016-01-15 ENCOUNTER — Ambulatory Visit (INDEPENDENT_AMBULATORY_CARE_PROVIDER_SITE_OTHER): Payer: Medicare Other | Admitting: Adult Health

## 2016-01-15 VITALS — BP 112/70 | HR 72 | Ht 69.0 in | Wt 324.0 lb

## 2016-01-15 DIAGNOSIS — F329 Major depressive disorder, single episode, unspecified: Secondary | ICD-10-CM | POA: Diagnosis not present

## 2016-01-15 DIAGNOSIS — F32A Depression, unspecified: Secondary | ICD-10-CM

## 2016-01-15 DIAGNOSIS — E669 Obesity, unspecified: Secondary | ICD-10-CM | POA: Diagnosis not present

## 2016-01-15 DIAGNOSIS — Z3202 Encounter for pregnancy test, result negative: Secondary | ICD-10-CM | POA: Diagnosis not present

## 2016-01-15 DIAGNOSIS — Z1151 Encounter for screening for human papillomavirus (HPV): Secondary | ICD-10-CM | POA: Insufficient documentation

## 2016-01-15 DIAGNOSIS — Z6841 Body Mass Index (BMI) 40.0 and over, adult: Secondary | ICD-10-CM | POA: Diagnosis not present

## 2016-01-15 DIAGNOSIS — N926 Irregular menstruation, unspecified: Secondary | ICD-10-CM | POA: Diagnosis not present

## 2016-01-15 DIAGNOSIS — Z01419 Encounter for gynecological examination (general) (routine) without abnormal findings: Secondary | ICD-10-CM

## 2016-01-15 DIAGNOSIS — Z124 Encounter for screening for malignant neoplasm of cervix: Secondary | ICD-10-CM | POA: Diagnosis not present

## 2016-01-15 DIAGNOSIS — Z30011 Encounter for initial prescription of contraceptive pills: Secondary | ICD-10-CM

## 2016-01-15 LAB — POCT URINE PREGNANCY: PREG TEST UR: NEGATIVE

## 2016-01-15 MED ORDER — NORGESTIMATE-ETH ESTRADIOL 0.25-35 MG-MCG PO TABS: 1.0000 | ORAL_TABLET | Freq: Every day | ORAL | 11 refills | Status: DC

## 2016-01-15 MED ORDER — ESCITALOPRAM OXALATE 10 MG PO TABS: 10.0000 mg | ORAL_TABLET | Freq: Every day | ORAL | 6 refills | Status: DC

## 2016-01-15 NOTE — Progress Notes (Signed)
Patient ID: [name], female   DOB: 01/05/1986, 30 y.o.   MRN: [id number] History of Present Illness:  [name] is a 30 year old black female in for well woman gyn exam and pap.She is having irregular periods.She had miscarriage in November.She is feeling depressed but denies any suicidal ideations.She used to take Effexor but stopped and does not want that again.She does not want to be pregnant now.  PCP is Dr Lodema HongSimpson.  Current Medications, Allergies, Past Medical History, Past Surgical History, Family History and Social History were reviewed in [facility] electronic [facility] record.     Review of Systems: Patient denies any headaches, hearing loss, fatigue, blurred vision, shortness of breath, chest pain, abdominal pain, problems with bowel movements, urination, or intercourse. No joint pain or mood swings.+irregular periods, and depression    Physical Exam:BP 112/70   Pulse 72   Ht 5\' 9"  (1.753 m)   Wt (!) 324 lb (147 kg)   LMP 12/06/2015   BMI 47.85 kg/m  UPT negative General:  Well developed, well nourished, no acute distress Skin:  Warm and dry Neck:  Midline trachea, normal thyroid, good ROM, no lymphadenopathy Lungs; Clear to auscultation bilaterally Breast:  No dominant palpable mass, retraction, or nipple discharge Cardiovascular: Regular rate and rhythm Abdomen:  Soft, non tender, no hepatosplenomegaly,obese Pelvic:  External genitalia is normal in appearance, no lesions.  The vagina is normal in appearance. Urethra has no lesions or masses. The cervix is bulbous. Pap with HPV and GC/CHL performed. Uterus is felt to be normal size, shape, and contour.  No adnexal masses or tenderness noted.Bladder is non tender, no masses felt. Extremities/musculoskeletal:  No varicosities noted, no clubbing or cyanosis, Has lymphedema left lower leg,  Psych:  No mood changes, alert and cooperative,seems happy Try to eat better, and not over eat  PHQ 9 score was 8.Will try  lexapro to see if see feels better, she declines counseling at present.   Impression: 1. Encounter for gynecological examination with Papanicolaou smear of cervix   2. Irregular periods   3. Obesity   4. Depression   5. Encounter for initial prescription of contraceptive pills       Plan: Rx sprintec disp 1 pack take 1 daily start today with 11 refills Use condoms Rx lexapro 10 mg #30 take 1 daily with 6 refills Follow up in 4 weeks for ROS Physical in 1 year, pap in 3 if normal See Dr Lodema HongSimpson for labs

## 2016-01-15 NOTE — Addendum Note (Signed)
Addended by: Colen DarlingYOUNG, Trev Boley S on: 01/15/2016 12:21 PM   Modules accepted: Orders

## 2016-01-15 NOTE — Patient Instructions (Addendum)
Start OCs Use condoms Follow up in 4 weeks Physical in 1 year, pap in 3 if normal See Dr Lodema HongSimpson for labs Major Depressive Disorder Major depressive disorder is a mental illness. It also may be called clinical depression or unipolar depression. Major depressive disorder usually causes feelings of sadness, hopelessness, or helplessness. Some people with this disorder do not feel particularly sad but lose interest in doing things they used to enjoy (anhedonia). Major depressive disorder also can cause physical symptoms. It can interfere with work, school, relationships, and other normal everyday activities. The disorder varies in severity but is longer lasting and more serious than the sadness we all feel from time to time in our lives. Major depressive disorder often is triggered by stressful life events or major life changes. Examples of these triggers include divorce, loss of your job or home, a move, and the death of a family member or close friend. Sometimes this disorder occurs for no obvious reason at all. People who have family members with major depressive disorder or bipolar disorder are at higher risk for developing this disorder, with or without life stressors. Major depressive disorder can occur at any age. It may occur just once in your life (single episode major depressive disorder). It may occur multiple times (recurrent major depressive disorder). SYMPTOMS People with major depressive disorder have either anhedonia or depressed mood on nearly a daily basis for at least 2 weeks or longer. Symptoms of depressed mood include:  Feelings of sadness (blue or down in the dumps) or emptiness.  Feelings of hopelessness or helplessness.  Tearfulness or episodes of crying (may be observed by others).  Irritability (children and adolescents). In addition to depressed mood or anhedonia or both, people with this disorder have at least four of the following symptoms:  Difficulty sleeping or  sleeping too much.   Significant change (increase or decrease) in appetite or weight.   Lack of energy or motivation.  Feelings of guilt and worthlessness.   Difficulty concentrating, remembering, or making decisions.  Unusually slow movement (psychomotor retardation) or restlessness (as observed by others).   Recurrent wishes for death, recurrent thoughts of self-harm (suicide), or a suicide attempt. People with major depressive disorder commonly have persistent negative thoughts about themselves, other people, and the world. People with severe major depressive disorder may experiencedistorted beliefs or perceptions about the world (psychotic delusions). They also may see or hear things that are not real (psychotic hallucinations). DIAGNOSIS Major depressive disorder is diagnosed through an assessment by your health care provider. Your health care provider will ask aboutaspects of your daily life, such as mood,sleep, and appetite, to see if you have the diagnostic symptoms of major depressive disorder. Your health care provider may ask about your medical history and use of alcohol or drugs, including prescription medicines. Your health care provider also may do a physical exam and blood work. This is because certain medical conditions and the use of certain substances can cause major depressive disorder-like symptoms (secondary depression). Your health care provider also may refer you to a mental health specialist for further evaluation and treatment. TREATMENT It is important to recognize the symptoms of major depressive disorder and seek treatment. The following treatments can be prescribed for this disorder:   Medicine. Antidepressant medicines usually are prescribed. Antidepressant medicines are thought to correct chemical imbalances in the brain that are commonly associated with major depressive disorder. Other types of medicine may be added if the symptoms do not respond to  antidepressant medicines  alone or if psychotic delusions or hallucinations occur.  Talk therapy. Talk therapy can be helpful in treating major depressive disorder by providing support, education, and guidance. Certain types of talk therapy also can help with negative thinking (cognitive behavioral therapy) and with relationship issues that trigger this disorder (interpersonal therapy). A mental health specialist can help determine which treatment is best for you. Most people with major depressive disorder do well with a combination of medicine and talk therapy. Treatments involving electrical stimulation of the brain can be used in situations with extremely severe symptoms or when medicine and talk therapy do not work over time. These treatments include electroconvulsive therapy, transcranial magnetic stimulation, and vagal nerve stimulation.   This information is not intended to replace advice given to you by your health care provider. Make sure you discuss any questions you have with your health care provider.   Document Released: 08/06/2012 Document Revised: 05/02/2014 Document Reviewed: 08/06/2012 Elsevier Interactive Patient Education [employer].

## 2016-01-18 LAB — CYTOLOGY - PAP

## 2016-02-14 ENCOUNTER — Other Ambulatory Visit: Payer: Self-pay | Admitting: Adult Health

## 2016-02-15 ENCOUNTER — Ambulatory Visit: Payer: Medicare Other | Admitting: Adult Health

## 2016-04-22 ENCOUNTER — Encounter: Payer: Self-pay | Admitting: Family Medicine

## 2016-04-22 ENCOUNTER — Ambulatory Visit (INDEPENDENT_AMBULATORY_CARE_PROVIDER_SITE_OTHER): Payer: Medicare Other | Admitting: Family Medicine

## 2016-04-22 VITALS — BP 110/70 | HR 72 | Temp 98.5°F | Resp 18 | Ht 69.0 in | Wt 341.0 lb

## 2016-04-22 DIAGNOSIS — L989 Disorder of the skin and subcutaneous tissue, unspecified: Secondary | ICD-10-CM | POA: Diagnosis not present

## 2016-04-22 DIAGNOSIS — L918 Other hypertrophic disorders of the skin: Secondary | ICD-10-CM

## 2016-04-22 NOTE — Patient Instructions (Signed)
Leave bandaid on until tomorrow Report any increased pain or drainage

## 2016-04-22 NOTE — Progress Notes (Signed)
Here for removal of skin lesion neck Present for a year but recently growing in size and tender to touch It bled last night after scratching  Posterior neck with inflamed skin tag  TIME OUT Consent Area cleansed with betadine .5 cc of 1% lidocaine placed Tag shaved off at skin  Pressure applied and bandaid  Post removal care discussed

## 2016-05-02 ENCOUNTER — Encounter: Payer: Self-pay | Admitting: Family Medicine

## 2016-05-02 ENCOUNTER — Ambulatory Visit: Payer: Self-pay

## 2016-05-16 ENCOUNTER — Telehealth: Payer: Self-pay | Admitting: Family Medicine

## 2016-05-16 NOTE — Telephone Encounter (Signed)
Called to set up AWV and see if she would be available at 2:45 or 3:30 on Thursday 05-19-16.  Had to leave message and await call back.

## 2016-08-25 ENCOUNTER — Emergency Department (HOSPITAL_COMMUNITY)
Admission: EM | Admit: 2016-08-25 | Discharge: 2016-08-25 | Disposition: A | Discharge status: Home / Self Care | Payer: Medicare Other | Attending: Emergency Medicine | Admitting: Emergency Medicine

## 2016-08-25 ENCOUNTER — Encounter (HOSPITAL_COMMUNITY): Payer: Self-pay | Admitting: Emergency Medicine

## 2016-08-25 DIAGNOSIS — J069 Acute upper respiratory infection, unspecified: Secondary | ICD-10-CM

## 2016-08-25 DIAGNOSIS — Z87891 Personal history of nicotine dependence: Secondary | ICD-10-CM | POA: Insufficient documentation

## 2016-08-25 DIAGNOSIS — J029 Acute pharyngitis, unspecified: Secondary | ICD-10-CM

## 2016-08-25 MED ORDER — ONDANSETRON HCL 4 MG PO TABS
4.0000 mg | ORAL_TABLET | Freq: Once | ORAL | Status: AC
  Administered 2016-08-25: 4 mg via ORAL
  Filled 2016-08-25: qty 1

## 2016-08-25 MED ORDER — IBUPROFEN 800 MG PO TABS
800.0000 mg | ORAL_TABLET | Freq: Once | ORAL | Status: AC
  Administered 2016-08-25: 800 mg via ORAL
  Filled 2016-08-25: qty 1

## 2016-08-25 MED ORDER — CEPHALEXIN 500 MG PO CAPS
500.0000 mg | ORAL_CAPSULE | Freq: Once | ORAL | Status: AC
  Administered 2016-08-25: 500 mg via ORAL
  Filled 2016-08-25: qty 1

## 2016-08-25 MED ORDER — PSEUDOEPHEDRINE HCL 60 MG PO TABS
60.0000 mg | ORAL_TABLET | Freq: Once | ORAL | Status: AC
Start: 2016-08-25 — End: 2016-08-25
  Administered 2016-08-25: 60 mg via ORAL
  Filled 2016-08-25: qty 1

## 2016-08-25 MED ORDER — CEPHALEXIN 500 MG PO CAPS: 500.0000 mg | ORAL_CAPSULE | Freq: Four times a day (QID) | ORAL | 0 refills | Status: DC

## 2016-08-25 MED ORDER — IBUPROFEN 600 MG PO TABS: 600.0000 mg | ORAL_TABLET | Freq: Four times a day (QID) | ORAL | 0 refills | Status: DC

## 2016-08-25 MED ORDER — LORATADINE-PSEUDOEPHEDRINE ER 5-120 MG PO TB12: 1.0000 | ORAL_TABLET | Freq: Two times a day (BID) | ORAL | 0 refills | Status: DC

## 2016-08-25 NOTE — ED Notes (Signed)
Pt c/o right sided facial pressure and nasal congestion

## 2016-08-25 NOTE — ED Notes (Signed)
Instructed pt to take all of antibiotics as prescribed. 
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
## 2016-08-25 NOTE — ED Triage Notes (Signed)
PT c/o nasal congestion, sinus pressure, and sore throat x1 day. PT states she took a Claritin this am.

## 2016-08-25 NOTE — Discharge Instructions (Signed)
Please use Chloraseptic Spray for your throat pain. Please use ibuprofen with breakfast, lunch, dinner, and at bedtime. Please increase fluids. Wash hands frequently. Use Keflex with breakfast, lunch, dinner, and at bedtime. Use Claritin-D every 12 hours for congestion and facial puffiness.

## 2016-09-01 ENCOUNTER — Ambulatory Visit: Payer: Medicare Other

## 2016-09-07 ENCOUNTER — Ambulatory Visit: Payer: Self-pay | Admitting: Family Medicine

## 2016-09-07 ENCOUNTER — Emergency Department (HOSPITAL_COMMUNITY)
Admission: EM | Admit: 2016-09-07 | Discharge: 2016-09-07 | Disposition: A | Discharge status: Home / Self Care | Payer: Medicare Other | Attending: Emergency Medicine | Admitting: Emergency Medicine

## 2016-09-07 ENCOUNTER — Encounter (HOSPITAL_COMMUNITY): Payer: Self-pay | Admitting: Emergency Medicine

## 2016-09-07 DIAGNOSIS — Z87891 Personal history of nicotine dependence: Secondary | ICD-10-CM | POA: Insufficient documentation

## 2016-09-07 DIAGNOSIS — J302 Other seasonal allergic rhinitis: Secondary | ICD-10-CM | POA: Insufficient documentation

## 2016-09-07 DIAGNOSIS — R1110 Vomiting, unspecified: Secondary | ICD-10-CM | POA: Diagnosis not present

## 2016-09-07 DIAGNOSIS — R600 Localized edema: Secondary | ICD-10-CM | POA: Diagnosis not present

## 2016-09-07 DIAGNOSIS — Z79899 Other long term (current) drug therapy: Secondary | ICD-10-CM | POA: Insufficient documentation

## 2016-09-07 DIAGNOSIS — R0981 Nasal congestion: Secondary | ICD-10-CM | POA: Diagnosis present

## 2016-09-07 DIAGNOSIS — J3089 Other allergic rhinitis: Secondary | ICD-10-CM

## 2016-09-07 MED ORDER — BENZONATATE 100 MG PO CAPS: 200.0000 mg | ORAL_CAPSULE | Freq: Three times a day (TID) | ORAL | 0 refills | Status: DC | PRN

## 2016-09-07 MED ORDER — BENZONATATE 100 MG PO CAPS
200.0000 mg | ORAL_CAPSULE | Freq: Once | ORAL | Status: AC
  Administered 2016-09-07: 200 mg via ORAL
  Filled 2016-09-07: qty 2

## 2016-09-07 MED ORDER — FLUTICASONE PROPIONATE 50 MCG/ACT NA SUSP
2.0000 | Freq: Every day | NASAL | 0 refills | Status: DC
Start: 2016-09-07 — End: 2017-08-01

## 2016-09-07 MED ORDER — LEVOCETIRIZINE DIHYDROCHLORIDE 5 MG PO TABS: 5.0000 mg | ORAL_TABLET | Freq: Every evening | ORAL | 0 refills | Status: DC

## 2016-09-07 NOTE — Discharge Instructions (Signed)
You may also try your nanny pot for nasal congestion, vicks vapor stick, cough drops (menthol cough drops especially can also help with nasal congestion).

## 2016-09-07 NOTE — ED Triage Notes (Signed)
Pt was here last week for same, nasal congestion, throat swelling/pain, painful swallowing, and cough.  Was given antibiotics for URI and states she has not improved.  Pt states she threw up after coughing yesterday, but has not thrown up since. Pt alert and oriented, pt airway patent.
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
## 2016-10-03 ENCOUNTER — Ambulatory Visit: Payer: Self-pay

## 2016-10-10 ENCOUNTER — Ambulatory Visit (INDEPENDENT_AMBULATORY_CARE_PROVIDER_SITE_OTHER): Payer: Medicare Other

## 2016-10-10 VITALS — BP 130/74 | HR 82 | Temp 98.7°F | Ht 69.0 in | Wt 345.1 lb

## 2016-10-10 DIAGNOSIS — E559 Vitamin D deficiency, unspecified: Secondary | ICD-10-CM

## 2016-10-10 DIAGNOSIS — E785 Hyperlipidemia, unspecified: Secondary | ICD-10-CM

## 2016-10-10 DIAGNOSIS — R7301 Impaired fasting glucose: Secondary | ICD-10-CM | POA: Diagnosis not present

## 2016-10-10 DIAGNOSIS — Z0000 Encounter for general adult medical examination without abnormal findings: Secondary | ICD-10-CM | POA: Diagnosis not present

## 2016-10-10 DIAGNOSIS — R7303 Prediabetes: Secondary | ICD-10-CM | POA: Diagnosis not present

## 2016-10-10 NOTE — Patient Instructions (Signed)
[name] , Thank you for taking time to come for your Medicare Wellness Visit. I appreciate your ongoing commitment to your health goals. Please review the following plan we discussed and let me know if I can assist you in the future.   Screening recommendations/referrals: Colonoscopy: Due at age 31 Mammogram: Due at age 44 Bone Density: Due at age 68 Recommended yearly ophthalmology/optometry visit for glaucoma screening and checkup Recommended yearly dental visit for hygiene and checkup  Vaccinations: Influenza vaccine: Due 12/2016 Pneumococcal vaccine: Due at age 61 Tdap vaccine: Up to date, due 01/2021 Shingles vaccine: Due at age 56    Advanced directives: Advance directive discussed with you today. I have provided a copy for you to complete at home and have notarized. Once this is complete please bring a copy in to our office so we can scan it into your chart.  Conditions/risks identified: Obese, recommend starting a routine exercise program at least 3 days a week for 30-45 minutes at a time as tolerated.   Next appointment: Follow up with Dr. Moshe Cipro as soon as possible. Follow up in 1 year for your annual wellness visit.   Preventive Care 18-39 Years, Female Preventive care refers to lifestyle choices and visits with your health care provider that can promote health and wellness. What does preventive care include?  A yearly physical exam. This is also called an annual well check.  Dental exams once or twice a year.  Routine eye exams. Ask your health care provider how often you should have your eyes checked.  Personal lifestyle choices, including: ? Daily care of your teeth and gums. ? Regular physical activity. ? Eating a healthy diet. ? Avoiding tobacco and drug use. ? Limiting alcohol use. ? Practicing safe sex. ? Taking vitamin and mineral supplements as recommended by your health care provider. What happens during an annual well check? The services and  screenings done by your health care provider during your annual well check will depend on your age, overall health, lifestyle risk factors, and family history of disease. Counseling Your health care provider may ask you questions about your:  Alcohol use.  Tobacco use.  Drug use.  Emotional well-being.  Home and relationship well-being.  Sexual activity.  Eating habits.  Work and work Statistician.  Method of birth control.  Menstrual cycle.  Pregnancy history.  Screening You may have the following tests or measurements:  Height, weight, and BMI.  Diabetes screening. This is done by checking your blood sugar (glucose) after you have not eaten for a while (fasting).  Blood pressure.  Lipid and cholesterol levels. These may be checked every 5 years starting at age 92.  Skin check.  Hepatitis C blood test.  Hepatitis B blood test.  Sexually transmitted disease (STD) testing.  BRCA-related cancer screening. This may be done if you have a family history of breast, ovarian, tubal, or peritoneal cancers.  Pelvic exam and Pap test. This may be done every 3 years starting at age 32. Starting at age 52, this may be done every 5 years if you have a Pap test in combination with an HPV test.  Discuss your test results, treatment options, and if necessary, the need for more tests with your health care provider. Vaccines Your health care provider may recommend certain vaccines, such as:  Influenza vaccine. This is recommended every year.  Tetanus, diphtheria, and acellular pertussis (Tdap, Td) vaccine. You may need a Td booster every 10 years.  Varicella vaccine. You may  need this if you have not been vaccinated.  HPV vaccine. If you are 78 or younger, you may need three doses over 6 months.  Measles, mumps, and rubella (MMR) vaccine. You may need at least one dose of MMR. You may also need a second dose.  Pneumococcal 13-valent conjugate (PCV13) vaccine. You may need  this if you have certain conditions and were not previously vaccinated.  Pneumococcal polysaccharide (PPSV23) vaccine. You may need one or two doses if you smoke cigarettes or if you have certain conditions.  Meningococcal vaccine. One dose is recommended if you are age 34-21 years and a first-year college student living in a residence hall, or if you have one of several medical conditions. You may also need additional booster doses.  Hepatitis A vaccine. You may need this if you have certain conditions or if you travel or work in places where you may be exposed to hepatitis A.  Hepatitis B vaccine. You may need this if you have certain conditions or if you travel or work in places where you may be exposed to hepatitis B.  Haemophilus influenzae type b (Hib) vaccine. You may need this if you have certain risk factors.  Talk to your health care provider about which screenings and vaccines you need and how often you need them. This information is not intended to replace advice given to you by your health care provider. Make sure you discuss any questions you have with your health care provider. Document Released: 06/07/2001 Document Revised: 12/30/2015 Document Reviewed: 02/10/2015 Elsevier Interactive Patient Education  2017 [employer].
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
## 2016-10-17 ENCOUNTER — Ambulatory Visit: Payer: Self-pay

## 2016-11-23 ENCOUNTER — Ambulatory Visit: Payer: Self-pay | Admitting: Family Medicine

## 2017-06-08 ENCOUNTER — Encounter (INDEPENDENT_AMBULATORY_CARE_PROVIDER_SITE_OTHER): Payer: Medicare Other

## 2017-06-13 ENCOUNTER — Encounter (INDEPENDENT_AMBULATORY_CARE_PROVIDER_SITE_OTHER): Payer: Self-pay

## 2017-06-13 ENCOUNTER — Ambulatory Visit (INDEPENDENT_AMBULATORY_CARE_PROVIDER_SITE_OTHER): Payer: Medicare Other | Admitting: Family Medicine

## 2017-07-13 ENCOUNTER — Encounter: Payer: Self-pay | Admitting: Family Medicine

## 2017-07-13 ENCOUNTER — Ambulatory Visit (INDEPENDENT_AMBULATORY_CARE_PROVIDER_SITE_OTHER): Payer: Medicare Other | Admitting: Family Medicine

## 2017-07-13 ENCOUNTER — Ambulatory Visit: Payer: Medicare Other | Admitting: Family Medicine

## 2017-07-13 VITALS — BP 138/90 | HR 100 | Resp 16 | Ht 69.0 in | Wt 350.0 lb

## 2017-07-13 DIAGNOSIS — R197 Diarrhea, unspecified: Secondary | ICD-10-CM | POA: Diagnosis not present

## 2017-07-13 DIAGNOSIS — G5602 Carpal tunnel syndrome, left upper limb: Secondary | ICD-10-CM | POA: Diagnosis not present

## 2017-07-13 DIAGNOSIS — E559 Vitamin D deficiency, unspecified: Secondary | ICD-10-CM

## 2017-07-13 DIAGNOSIS — E785 Hyperlipidemia, unspecified: Secondary | ICD-10-CM | POA: Diagnosis not present

## 2017-07-13 DIAGNOSIS — Z30019 Encounter for initial prescription of contraceptives, unspecified: Secondary | ICD-10-CM | POA: Diagnosis not present

## 2017-07-13 DIAGNOSIS — F332 Major depressive disorder, recurrent severe without psychotic features: Secondary | ICD-10-CM | POA: Diagnosis not present

## 2017-07-13 DIAGNOSIS — R7303 Prediabetes: Secondary | ICD-10-CM

## 2017-07-13 DIAGNOSIS — R1110 Vomiting, unspecified: Secondary | ICD-10-CM | POA: Insufficient documentation

## 2017-07-13 DIAGNOSIS — E8881 Metabolic syndrome: Secondary | ICD-10-CM

## 2017-07-13 MED ORDER — ONDANSETRON HCL 4 MG PO TABS: 4.0000 mg | ORAL_TABLET | Freq: Three times a day (TID) | ORAL | 0 refills | Status: DC | PRN

## 2017-07-13 NOTE — Assessment & Plan Note (Signed)
1 week history, twice daily  Labs and prn zofran

## 2017-07-13 NOTE — Patient Instructions (Addendum)
Wellness with nurse due in July   MD follow up in 3 months, call if you need me sooner  You nEED to re establish with psychiatry and you are being referred , you have promised to follow through, you need this  You are beiong referred to gyne for management of contraception, as you do not need to get pregnant  Labs today, cBC, lipid, cmp and eGr, tSH, hBA1C  zofran tablets for nausea  Are prescribed in limited amount   Tingling in hand likely from carpal tunnel, will address further at next visit

## 2017-07-14 LAB — CBC
HEMATOCRIT: 39.7 % (ref 35.0–45.0)
HEMOGLOBIN: 12.9 g/dL (ref 11.7–15.5)
MCH: 25.5 pg — ABNORMAL LOW (ref 27.0–33.0)
MCHC: 32.5 g/dL (ref 32.0–36.0)
MCV: 78.5 fL — AB (ref 80.0–100.0)
MPV: 11.2 fL (ref 7.5–12.5)
Platelets: 327 10*3/uL (ref 140–400)
RBC: 5.06 10*6/uL (ref 3.80–5.10)
RDW: 13.4 % (ref 11.0–15.0)
WBC: 9.8 10*3/uL (ref 3.8–10.8)

## 2017-07-14 LAB — COMPLETE METABOLIC PANEL WITH GFR
AG RATIO: 1.3 (calc) (ref 1.0–2.5)
ALKALINE PHOSPHATASE (APISO): 76 U/L (ref 33–115)
ALT: 31 U/L — AB (ref 6–29)
AST: 28 U/L (ref 10–30)
Albumin: 4.3 g/dL (ref 3.6–5.1)
BILIRUBIN TOTAL: 0.3 mg/dL (ref 0.2–1.2)
BUN: 10 mg/dL (ref 7–25)
CHLORIDE: 104 mmol/L (ref 98–110)
CO2: 29 mmol/L (ref 20–32)
Calcium: 9.5 mg/dL (ref 8.6–10.2)
Creat: 0.92 mg/dL (ref 0.50–1.10)
GFR, EST AFRICAN AMERICAN: 94 mL/min/{1.73_m2} (ref >=60)
GFR, Est Non African American: 81 mL/min/{1.73_m2} (ref >=60)
GLUCOSE: 97 mg/dL (ref 65–99)
Globulin: 3.2 g/dL (calc) (ref 1.9–3.7)
POTASSIUM: 4.6 mmol/L (ref 3.5–5.3)
Sodium: 139 mmol/L (ref 135–146)
TOTAL PROTEIN: 7.5 g/dL (ref 6.1–8.1)

## 2017-07-14 LAB — LIPID PANEL
CHOL/HDL RATIO: 7.3 (calc) — AB (ref <5.0)
Cholesterol: 233 mg/dL — ABNORMAL HIGH (ref <200)
HDL: 32 mg/dL — AB (ref >50)
LDL CHOLESTEROL (CALC): 165 mg/dL — AB
NON-HDL CHOLESTEROL (CALC): 201 mg/dL — AB (ref <130)
TRIGLYCERIDES: 197 mg/dL — AB (ref <150)

## 2017-07-14 LAB — HEMOGLOBIN A1C
EAG (MMOL/L): 6.5 (calc)
Hgb A1c MFr Bld: 5.7 % of total Hgb — ABNORMAL HIGH (ref <5.7)
Mean Plasma Glucose: 117 (calc)

## 2017-07-14 LAB — TSH: TSH: 2.03 mIU/L

## 2017-07-16 ENCOUNTER — Encounter: Payer: Self-pay | Admitting: Family Medicine

## 2017-07-16 DIAGNOSIS — Z309 Encounter for contraceptive management, unspecified: Secondary | ICD-10-CM | POA: Insufficient documentation

## 2017-07-16 DIAGNOSIS — G5602 Carpal tunnel syndrome, left upper limb: Secondary | ICD-10-CM | POA: Insufficient documentation

## 2017-07-16 NOTE — Progress Notes (Signed)
[name] LeaverCourtney D Horton     MRN: [id number]      DOB: 10/24/1985   HPI [name] is here for follow up Last seen by me over 2 years ago, and actually had a miscarriage in the interim. She states that her Mother is requesting she be referred to gyne for contraception as she does not ned to get pregnant. [name] AmendCourtney herself feels as though she would like to have a child but honestly feels she is not capable of responsible parenting so she agrees with the referral C/o 1 week h/o nausea and vomiting,  And loose stools. also c/o numbness and tingling in her left hand which is fairly recent , and she has to shake her hand for symptom relief C/o depression and social withdrawal, stays in a dark room most of the time, worse since her  Miscarriage, not suicidal or homicidal ROS Denies recent fever or chills. Denies sinus pressure, nasal congestion, ear pain or sore throat. Denies chest congestion, productive cough or wheezing. Denies chest pains, palpitations and leg swelling    Denies dysuria, frequency, hesitancy or incontinence. Denies joint pain, swelling and limitation in mobility. Denies headaches, seizures, . Denies skin break down or rash.   PE  BP 138/90   Pulse 100   Resp 16   Ht 5\' 9"  (1.753 m)   Wt (!) 350 lb (158.8 kg)   SpO2 95%   BMI 51.69 kg/m   Patient alert and oriented and in no cardiopulmonary distress.  HEENT: No facial asymmetry, EOMI,   oropharynx pink and moist.  Neck supple no JVD, no mass.  Chest: Clear to auscultation bilaterally.  CVS: S1, S2 no murmurs, no S3.Regular rate.  ABD: Soft , mild diffuse superficiale tenderness, no guarding or rebound, hyperactive BS  Ext: marked lymphedema of right lower extremity which is chronic  MS: Adequate ROM spine, shoulders, hips and knees.Positive tin nels sign and wasting of left thenar muscle  Skin: Intact, no ulcerations or rash noted.  Psych: Good eye contact, normal affect. Memory intact not anxious or  depressed appearing.  CNS: CN 2-12 intact, power,  normal throughout.no focal deficits noted.   Assessment & Plan  Vomiting and diarrhea 1 week history, twice daily  Labs and prn zofran  Depression Refer to psychiatry for evaluation and management, [facility] and needs to stay in treatment  Contraceptive management Patient and her Mother ( reportedly) desirous of  A reliable form of contraception to prevent future pregnancy due to Blondie's chronic health conditions, which is very appropriate. She is referred to Dr Emelda FearFerguson and agrees    Hyperlipidemia LDL goal <100 Hyperlipidemia:Low fat diet discussed and encouraged.   Lipid Panel  Lab Results  Component Value Date   CHOL 233 (H) 07/13/2017   HDL 32 (L) 07/13/2017   LDLCALC 165 (H) 07/13/2017   TRIG 197 (H) 07/13/2017   CHOLHDL 7.3 (H) 07/13/2017   Markedly elevated with very high CV risk , needs to change diet and start regular exercise    Prediabetes Patient educated about the importance of limiting  Carbohydrate intake , the need to commit to daily physical activity for a minimum of 30 minutes , and to commit weight loss. The fact that changes in all these areas will reduce or eliminate all together the development of diabetes is stressed.  deteriorated  Diabetic Labs Latest Ref Rng & Units 07/13/2017 02/11/2015 07/21/2014 05/07/2013 11/05/2012  HbA1c <5.7 % of total Hgb  5.7(H) 5.6 5.7(H) 5.9(H) 5.7(H)  Chol <200 mg/dL 161(W) 960(A) 540(J) 811(B) 224(H)  HDL >50 mg/dL 14(N) 82(N) 56(O) 13(Y) 32(L)  Calc LDL mg/dL (calc) 865(H) 846(N) 629(B) 141(H) 140(H)  Triglycerides <150 mg/dL 284(X) 324(M) 010 272(Z) 259(H)  Creatinine 0.50 - 1.10 mg/dL 3.66 4.40 3.47 4.25 9.56   BP/Weight 07/13/2017 10/10/2016 09/07/2016 08/25/2016 04/22/2016 01/15/2016 04/06/2015  Systolic BP 138 130 117 106 110 112 128  Diastolic BP 90 74 58 85 70 70 86  Wt. (Lbs) 350 345.08 - 340 341 324 324  BMI 51.69 50.96  - 50.21 50.36 47.85 47.82   No flowsheet data found.    Morbid obesity Deteriorated. Patient re-educated about  the importance of commitment to a  minimum of 150 minutes of exercise per week.  The importance of healthy food choices with portion control discussed. Encouraged to start a food diary, count calories and to consider  joining a support group. Sample diet sheets offered. Goals set by the patient for the next several months.   Weight /BMI 07/13/2017 10/10/2016 08/25/2016  WEIGHT 350 lb 345 lb 1.3 oz 340 lb  HEIGHT 5\' 9"  5\' 9"  5\' 9"   BMI 51.69 kg/m2 50.96 kg/m2 50.21 kg/m2      Metabolic syndrome X The increased risk of cardiovascular disease associated with this diagnosis, and the need to consistently work on lifestyle to change this is discussed. Following  a  heart healthy diet ,commitment to 30 minutes of exercise at least 5 days per week, as well as control of blood sugar and cholesterol , and achieving a healthy weight are all the areas to be addressed .   Carpal tunnel syndrome on left Mild symptoms and signs on left hand , will follow upo at next visit, no intervention at this time

## 2017-07-16 NOTE — Assessment & Plan Note (Signed)
Deteriorated. Patient re-educated about  the importance of commitment to a  minimum of 150 minutes of exercise per week.  The importance of healthy food choices with portion control discussed. Encouraged to start a food diary, count calories and to consider  joining a support group. Sample diet sheets offered. Goals set by the patient for the next several months.   Weight /BMI 07/13/2017 10/10/2016 08/25/2016  WEIGHT 350 lb 345 lb 1.3 oz 340 lb  HEIGHT 5\' 9"  5\' 9"  5\' 9"   BMI 51.69 kg/m2 50.96 kg/m2 50.21 kg/m2

## 2017-07-16 NOTE — Assessment & Plan Note (Signed)
Patient and her Mother ( reportedly) desirous of  A reliable form of contraception to prevent future pregnancy due to [name]'s chronic health conditions, which is very appropriate. She is referred to Dr Emelda FearFerguson and agrees

## 2017-07-16 NOTE — Assessment & Plan Note (Signed)
Hyperlipidemia:Low fat diet discussed and encouraged.   Lipid Panel  Lab Results  Component Value Date   CHOL 233 (H) 07/13/2017   HDL 32 (L) 07/13/2017   LDLCALC 165 (H) 07/13/2017   TRIG 197 (H) 07/13/2017   CHOLHDL 7.3 (H) 07/13/2017   Markedly elevated with very high CV risk , needs to change diet and start regular exercise

## 2017-07-16 NOTE — Assessment & Plan Note (Signed)
Mild symptoms and signs on left hand , will follow upo at next visit, no intervention at this time

## 2017-07-16 NOTE — Assessment & Plan Note (Signed)
The increased risk of cardiovascular disease associated with this diagnosis, and the need to consistently work on lifestyle to change this is discussed. Following  a  heart healthy diet ,commitment to 30 minutes of exercise at least 5 days per week, as well as control of blood sugar and cholesterol , and achieving a healthy weight are all the areas to be addressed .  

## 2017-07-16 NOTE — Assessment & Plan Note (Signed)
Refer to psychiatry for evaluation and management, [facility] and needs to stay in treatment

## 2017-07-16 NOTE — Assessment & Plan Note (Signed)
Patient educated about the importance of limiting  Carbohydrate intake , the need to commit to daily physical activity for a minimum of 30 minutes , and to commit weight loss. The fact that changes in all these areas will reduce or eliminate all together the development of diabetes is stressed.  deteriorated  Diabetic Labs Latest Ref Rng & Units 07/13/2017 02/11/2015 07/21/2014 05/07/2013 11/05/2012  HbA1c <5.7 % of total Hgb 5.7(H) 5.6 5.7(H) 5.9(H) 5.7(H)  Chol <200 mg/dL 161(W233(H) 960(A206(H) 540(J227(H) 811(B214(H) 224(H)  HDL >50 mg/dL 14(N32(L) 82(N32(L) 56(O31(L) 13(Y33(L) 32(L)  Calc LDL mg/dL (calc) 865(H165(H) 846(N142(H) 629(B167(H) 141(H) 140(H)  Triglycerides <150 mg/dL 284(X197(H) 324(M161(H) 010147 272(Z198(H) 259(H)  Creatinine 0.50 - 1.10 mg/dL 3.660.92 4.400.94 3.470.91 4.250.93 9.560.78   BP/Weight 07/13/2017 10/10/2016 09/07/2016 08/25/2016 04/22/2016 01/15/2016 04/06/2015  Systolic BP 138 130 117 106 110 112 128  Diastolic BP 90 74 58 85 70 70 86  Wt. (Lbs) 350 345.08 - 340 341 324 324  BMI 51.69 50.96 - 50.21 50.36 47.85 47.82   No flowsheet data found.

## 2017-08-01 ENCOUNTER — Ambulatory Visit (INDEPENDENT_AMBULATORY_CARE_PROVIDER_SITE_OTHER): Payer: Medicare Other | Admitting: Adult Health

## 2017-08-01 ENCOUNTER — Encounter: Payer: Self-pay | Admitting: Adult Health

## 2017-08-01 VITALS — BP 132/86 | HR 90 | Ht 69.0 in | Wt 356.0 lb

## 2017-08-01 DIAGNOSIS — N946 Dysmenorrhea, unspecified: Secondary | ICD-10-CM | POA: Diagnosis not present

## 2017-08-01 DIAGNOSIS — Z30011 Encounter for initial prescription of contraceptive pills: Secondary | ICD-10-CM

## 2017-08-01 DIAGNOSIS — N920 Excessive and frequent menstruation with regular cycle: Secondary | ICD-10-CM

## 2017-08-01 MED ORDER — NORETHIN ACE-ETH ESTRAD-FE 1-20 MG-MCG(24) PO CAPS: 1.0000 | ORAL_CAPSULE | Freq: Every day | ORAL | 12 refills | Status: DC

## 2017-08-01 NOTE — Progress Notes (Signed)
Subjective:     Patient ID: [name], female   DOB: 04/08/1986, 32 y.o.   MRN: [id number]  HPI [name] is a 32 year old black female, [id number], if to get on birth control, she says her PCP Dr Lodema HongSimpson, says she should not get pregnant right now, but she doesn't think she can.And her periods are heavy and last 9 days.They have been regular since miscarriage in 2016. She had normal pap with negative HPV 9/222/17.   Review of Systems Heavy  Periods,esp first day, changes every 2 hours Periods last 9 days Reviewed past medical,surgical, social and family history. Reviewed medications and allergies.     Objective:   Physical Exam BP 132/86 (BP Location: Left Arm, Patient Position: Sitting, Cuff Size: Normal)   Pulse 90   Ht 5\' 9"  (1.753 m)   Wt (!) 356 lb (161.5 kg)   LMP 07/03/2017   BMI 52.57 kg/m   Skin warm and dry. Neck: mid line trachea, normal thyroid, good ROM, no lymphadenopathy noted. Lungs: clear to ausculation bilaterally. Cardiovascular: regular rate and rhythm. PHQ 2 score 0. Will try Taytulla, start with next period.     Assessment:     1. Encounter for initial prescription of contraceptive pills   2. Menorrhagia with regular cycle   3. Dysmenorrhea       Plan:     Meds ordered this encounter  Medications  . Norethin Ace-Eth Estrad-FE (TAYTULLA) 1-20 MG-MCG(24) CAPS    Sig: Take 1 tablet by mouth daily.    Dispense:  28 capsule    Refill:  12    Order Specific Question:   Supervising Provider    Answer:   Lazaro ArmsEURE, LUTHER H [[id number]]  Start with next period Use condoms if has sex F/U in 3 months

## 2017-08-01 NOTE — Patient Instructions (Signed)
Use condoms too F/ in 3 months

## 2017-08-07 ENCOUNTER — Telehealth: Payer: Self-pay | Admitting: *Deleted

## 2017-08-07 NOTE — Telephone Encounter (Signed)
Taytulla approved.

## 2017-09-18 ENCOUNTER — Emergency Department (HOSPITAL_COMMUNITY)
Admission: EM | Admit: 2017-09-18 | Discharge: 2017-09-19 | Disposition: A | Discharge status: Home / Self Care | Payer: Medicare Other | Attending: Emergency Medicine | Admitting: Emergency Medicine

## 2017-09-18 ENCOUNTER — Encounter (HOSPITAL_COMMUNITY): Payer: Self-pay | Admitting: *Deleted

## 2017-09-18 ENCOUNTER — Other Ambulatory Visit: Payer: Self-pay

## 2017-09-18 DIAGNOSIS — Z87891 Personal history of nicotine dependence: Secondary | ICD-10-CM | POA: Diagnosis not present

## 2017-09-18 DIAGNOSIS — Z79899 Other long term (current) drug therapy: Secondary | ICD-10-CM | POA: Diagnosis not present

## 2017-09-18 DIAGNOSIS — R52 Pain, unspecified: Secondary | ICD-10-CM

## 2017-09-18 DIAGNOSIS — M546 Pain in thoracic spine: Secondary | ICD-10-CM | POA: Diagnosis not present

## 2017-09-18 DIAGNOSIS — M545 Low back pain: Secondary | ICD-10-CM | POA: Diagnosis not present

## 2017-09-18 DIAGNOSIS — G8929 Other chronic pain: Secondary | ICD-10-CM

## 2017-09-18 DIAGNOSIS — R609 Edema, unspecified: Secondary | ICD-10-CM | POA: Diagnosis not present

## 2017-09-18 LAB — URINALYSIS, ROUTINE W REFLEX MICROSCOPIC
Bilirubin Urine: NEGATIVE
GLUCOSE, UA: NEGATIVE mg/dL
Ketones, ur: NEGATIVE mg/dL
Leukocytes, UA: NEGATIVE
NITRITE: NEGATIVE
PH: 5 (ref 5.0–8.0)
PROTEIN: NEGATIVE mg/dL
Specific Gravity, Urine: 1.017 (ref 1.005–1.030)

## 2017-09-18 LAB — PREGNANCY, URINE: Preg Test, Ur: NEGATIVE

## 2017-09-18 NOTE — ED Triage Notes (Signed)
Pt c/o mid to lower back pain that has become worse over the past month, denies any injury, pain is worse with movement,

## 2017-09-19 ENCOUNTER — Encounter (HOSPITAL_COMMUNITY): Payer: Self-pay | Admitting: Radiology

## 2017-09-19 ENCOUNTER — Emergency Department (HOSPITAL_COMMUNITY): Payer: Medicare Other

## 2017-09-19 DIAGNOSIS — M546 Pain in thoracic spine: Secondary | ICD-10-CM | POA: Diagnosis not present

## 2017-09-19 DIAGNOSIS — M545 Low back pain: Secondary | ICD-10-CM | POA: Diagnosis not present

## 2017-09-19 LAB — COMPREHENSIVE METABOLIC PANEL
ALK PHOS: 72 U/L (ref 38–126)
ALT: 26 U/L (ref 14–54)
AST: 22 U/L (ref 15–41)
Albumin: 3.8 g/dL (ref 3.5–5.0)
Anion gap: 8 (ref 5–15)
BUN: 12 mg/dL (ref 6–20)
CALCIUM: 9.4 mg/dL (ref 8.9–10.3)
CO2: 26 mmol/L (ref 22–32)
CREATININE: 0.83 mg/dL (ref 0.44–1.00)
Chloride: 103 mmol/L (ref 101–111)
GFR calc non Af Amer: >60 mL/min (ref >60)
Glucose, Bld: 99 mg/dL (ref 65–99)
Potassium: 4 mmol/L (ref 3.5–5.1)
SODIUM: 137 mmol/L (ref 135–145)
Total Bilirubin: 0.6 mg/dL (ref 0.3–1.2)
Total Protein: 7.8 g/dL (ref 6.5–8.1)

## 2017-09-19 LAB — POC URINE PREG, ED: Preg Test, Ur: NEGATIVE

## 2017-09-19 LAB — ACETAMINOPHEN LEVEL: Acetaminophen (Tylenol), Serum: <10 ug/mL — ABNORMAL LOW (ref 10–30)

## 2017-09-19 LAB — SALICYLATE LEVEL: Salicylate Lvl: <7 mg/dL (ref 2.8–30.0)

## 2017-09-19 MED ORDER — PREDNISONE 20 MG PO TABS: ORAL_TABLET | ORAL | 0 refills | Status: DC

## 2017-09-19 MED ORDER — CYCLOBENZAPRINE HCL 5 MG PO TABS: 5.0000 mg | ORAL_TABLET | Freq: Three times a day (TID) | ORAL | 0 refills | Status: DC | PRN

## 2017-09-19 MED ORDER — CYCLOBENZAPRINE HCL 10 MG PO TABS
10.0000 mg | ORAL_TABLET | Freq: Once | ORAL | Status: AC
  Administered 2017-09-19: 10 mg via ORAL
  Filled 2017-09-19: qty 1

## 2017-09-19 MED ORDER — DEXAMETHASONE SODIUM PHOSPHATE 10 MG/ML IJ SOLN
10.0000 mg | Freq: Once | INTRAMUSCULAR | Status: AC
  Administered 2017-09-19: 10 mg via INTRAMUSCULAR
  Filled 2017-09-19: qty 1
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
## 2017-09-19 NOTE — Discharge Instructions (Addendum)
Use ice and heat for comfort. Take the medications as prescribed. You need to see an orthopedist to evaluate your back pains. Try to lose weight which will help.

## 2017-09-21 ENCOUNTER — Ambulatory Visit: Payer: Medicare Other | Admitting: Orthopaedic Surgery

## 2017-09-28 ENCOUNTER — Ambulatory Visit: Payer: Medicare Other | Admitting: Orthopaedic Surgery

## 2017-10-10 ENCOUNTER — Ambulatory Visit: Payer: Medicare Other | Admitting: Family Medicine

## 2017-10-10 ENCOUNTER — Ambulatory Visit: Payer: Self-pay | Admitting: Family Medicine

## 2017-10-10 DIAGNOSIS — E669 Obesity, unspecified: Secondary | ICD-10-CM | POA: Diagnosis not present

## 2017-10-10 DIAGNOSIS — E785 Hyperlipidemia, unspecified: Secondary | ICD-10-CM | POA: Diagnosis not present

## 2017-10-10 DIAGNOSIS — R5383 Other fatigue: Secondary | ICD-10-CM | POA: Diagnosis not present

## 2017-10-10 DIAGNOSIS — Z131 Encounter for screening for diabetes mellitus: Secondary | ICD-10-CM | POA: Diagnosis not present

## 2017-10-10 DIAGNOSIS — E559 Vitamin D deficiency, unspecified: Secondary | ICD-10-CM | POA: Diagnosis not present

## 2017-10-10 DIAGNOSIS — I10 Essential (primary) hypertension: Secondary | ICD-10-CM | POA: Diagnosis not present

## 2017-10-10 DIAGNOSIS — E282 Polycystic ovarian syndrome: Secondary | ICD-10-CM | POA: Diagnosis not present

## 2017-10-16 ENCOUNTER — Ambulatory Visit: Payer: Self-pay

## 2017-10-21 ENCOUNTER — Emergency Department (HOSPITAL_COMMUNITY): Payer: Medicare Other

## 2017-10-21 ENCOUNTER — Other Ambulatory Visit: Payer: Self-pay

## 2017-10-21 ENCOUNTER — Inpatient Hospital Stay (HOSPITAL_COMMUNITY)
Admission: EM | Admit: 2017-10-21 | Discharge: 2017-10-25 | DRG: 872 | Disposition: A | Discharge status: Home / Self Care | Payer: Medicare Other | Attending: Internal Medicine | Admitting: Internal Medicine

## 2017-10-21 DIAGNOSIS — E282 Polycystic ovarian syndrome: Secondary | ICD-10-CM | POA: Diagnosis present

## 2017-10-21 DIAGNOSIS — Z803 Family history of malignant neoplasm of breast: Secondary | ICD-10-CM | POA: Diagnosis not present

## 2017-10-21 DIAGNOSIS — Z6841 Body Mass Index (BMI) 40.0 and over, adult: Secondary | ICD-10-CM

## 2017-10-21 DIAGNOSIS — L03116 Cellulitis of left lower limb: Secondary | ICD-10-CM | POA: Diagnosis present

## 2017-10-21 DIAGNOSIS — A419 Sepsis, unspecified organism: Secondary | ICD-10-CM | POA: Diagnosis not present

## 2017-10-21 DIAGNOSIS — Y92009 Unspecified place in unspecified non-institutional (private) residence as the place of occurrence of the external cause: Secondary | ICD-10-CM | POA: Diagnosis not present

## 2017-10-21 DIAGNOSIS — R609 Edema, unspecified: Secondary | ICD-10-CM | POA: Diagnosis not present

## 2017-10-21 DIAGNOSIS — T402X5A Adverse effect of other opioids, initial encounter: Secondary | ICD-10-CM | POA: Diagnosis present

## 2017-10-21 DIAGNOSIS — E872 Acidosis: Secondary | ICD-10-CM | POA: Diagnosis present

## 2017-10-21 DIAGNOSIS — Z881 Allergy status to other antibiotic agents status: Secondary | ICD-10-CM

## 2017-10-21 DIAGNOSIS — E876 Hypokalemia: Secondary | ICD-10-CM | POA: Diagnosis not present

## 2017-10-21 DIAGNOSIS — I890 Lymphedema, not elsewhere classified: Secondary | ICD-10-CM | POA: Diagnosis not present

## 2017-10-21 DIAGNOSIS — E785 Hyperlipidemia, unspecified: Secondary | ICD-10-CM | POA: Diagnosis present

## 2017-10-21 DIAGNOSIS — Z87892 Personal history of anaphylaxis: Secondary | ICD-10-CM | POA: Diagnosis not present

## 2017-10-21 DIAGNOSIS — R112 Nausea with vomiting, unspecified: Secondary | ICD-10-CM | POA: Diagnosis present

## 2017-10-21 DIAGNOSIS — Z87891 Personal history of nicotine dependence: Secondary | ICD-10-CM | POA: Diagnosis not present

## 2017-10-21 DIAGNOSIS — Z880 Allergy status to penicillin: Secondary | ICD-10-CM

## 2017-10-21 DIAGNOSIS — Z793 Long term (current) use of hormonal contraceptives: Secondary | ICD-10-CM

## 2017-10-21 DIAGNOSIS — Z91410 Personal history of adult physical and sexual abuse: Secondary | ICD-10-CM | POA: Diagnosis not present

## 2017-10-21 DIAGNOSIS — R0602 Shortness of breath: Secondary | ICD-10-CM | POA: Diagnosis not present

## 2017-10-21 DIAGNOSIS — Z91030 Bee allergy status: Secondary | ICD-10-CM

## 2017-10-21 LAB — CBC WITH DIFFERENTIAL/PLATELET
ABS IMMATURE GRANULOCYTES: 0.1 10*3/uL (ref 0.0–0.1)
BASOS PCT: 0 %
Basophils Absolute: 0 10*3/uL (ref 0.0–0.1)
EOS ABS: 0 10*3/uL (ref 0.0–0.7)
Eosinophils Relative: 0 %
HEMATOCRIT: 38.7 % (ref 36.0–46.0)
Hemoglobin: 12.1 g/dL (ref 12.0–15.0)
IMMATURE GRANULOCYTES: 1 %
Lymphocytes Relative: 4 %
Lymphs Abs: 0.9 10*3/uL (ref 0.7–4.0)
MCH: 25.8 pg — ABNORMAL LOW (ref 26.0–34.0)
MCHC: 31.3 g/dL (ref 30.0–36.0)
MCV: 82.5 fL (ref 78.0–100.0)
MONOS PCT: 4 %
Monocytes Absolute: 0.7 10*3/uL (ref 0.1–1.0)
NEUTROS PCT: 91 %
Neutro Abs: 18.1 10*3/uL — ABNORMAL HIGH (ref 1.7–7.7)
Platelets: 304 10*3/uL (ref 150–400)
RBC: 4.69 MIL/uL (ref 3.87–5.11)
RDW: 15.4 % (ref 11.5–15.5)
WBC: 19.7 10*3/uL — ABNORMAL HIGH (ref 4.0–10.5)

## 2017-10-21 LAB — COMPREHENSIVE METABOLIC PANEL
ALBUMIN: 3.6 g/dL (ref 3.5–5.0)
ALK PHOS: 54 U/L (ref 38–126)
ALT: 20 U/L (ref 0–44)
AST: 20 U/L (ref 15–41)
Anion gap: 9 (ref 5–15)
BILIRUBIN TOTAL: 0.5 mg/dL (ref 0.3–1.2)
BUN: 6 mg/dL (ref 6–20)
CO2: 22 mmol/L (ref 22–32)
CREATININE: 1.03 mg/dL — AB (ref 0.44–1.00)
Calcium: 9.2 mg/dL (ref 8.9–10.3)
Chloride: 105 mmol/L (ref 98–111)
GFR calc Af Amer: >60 mL/min (ref >60)
Glucose, Bld: 106 mg/dL — ABNORMAL HIGH (ref 70–99)
POTASSIUM: 3.9 mmol/L (ref 3.5–5.1)
Sodium: 136 mmol/L (ref 135–145)
TOTAL PROTEIN: 7 g/dL (ref 6.5–8.1)

## 2017-10-21 LAB — I-STAT CG4 LACTIC ACID, ED: LACTIC ACID, VENOUS: 2.38 mmol/L — AB (ref 0.5–1.9)

## 2017-10-21 LAB — I-STAT BETA HCG BLOOD, ED (MC, WL, AP ONLY)

## 2017-10-21 LAB — PROTIME-INR
INR: 1.07
Prothrombin Time: 13.8 seconds (ref 11.4–15.2)

## 2017-10-21 MED ORDER — POLYETHYLENE GLYCOL 3350 17 G PO PACK: 17.0000 g | PACK | Freq: Every day | ORAL | Status: DC | PRN

## 2017-10-21 MED ORDER — CLINDAMYCIN PHOSPHATE 600 MG/50ML IV SOLN
600.0000 mg | Freq: Once | INTRAVENOUS | Status: AC
  Administered 2017-10-21: 600 mg via INTRAVENOUS
  Filled 2017-10-21: qty 50

## 2017-10-21 MED ORDER — ONDANSETRON HCL 4 MG/2ML IJ SOLN
4.0000 mg | Freq: Four times a day (QID) | INTRAMUSCULAR | Status: DC | PRN
  Administered 2017-10-23 – 2017-10-24 (×3): 4 mg via INTRAVENOUS
  Filled 2017-10-21 (×3): qty 2

## 2017-10-21 MED ORDER — ZOLPIDEM TARTRATE 5 MG PO TABS: 5.0000 mg | ORAL_TABLET | Freq: Every evening | ORAL | Status: DC | PRN

## 2017-10-21 MED ORDER — ACETAMINOPHEN 650 MG RE SUPP: 650.0000 mg | Freq: Four times a day (QID) | RECTAL | Status: DC | PRN

## 2017-10-21 MED ORDER — SODIUM CHLORIDE 0.9 % IV BOLUS
2000.0000 mL | Freq: Once | INTRAVENOUS | Status: AC
  Administered 2017-10-22: 2000 mL via INTRAVENOUS

## 2017-10-21 MED ORDER — SODIUM CHLORIDE 0.9 % IV SOLN
2.0000 g | Freq: Once | INTRAVENOUS | Status: AC
  Administered 2017-10-21: 2 g via INTRAVENOUS
  Filled 2017-10-21: qty 2

## 2017-10-21 MED ORDER — ENOXAPARIN SODIUM 80 MG/0.8ML ~~LOC~~ SOLN
80.0000 mg | SUBCUTANEOUS | Status: DC
  Filled 2017-10-21 (×3): qty 0.8

## 2017-10-21 MED ORDER — VICKS VAPOINHALER IN INHA: Freq: Every day | RESPIRATORY_TRACT | Status: DC | PRN

## 2017-10-21 MED ORDER — NORETHIN ACE-ETH ESTRAD-FE 1-20 MG-MCG(24) PO CAPS
1.0000 | ORAL_CAPSULE | Freq: Every day | ORAL | Status: DC
  Administered 2017-10-24 – 2017-10-25 (×2): 1 via ORAL

## 2017-10-21 MED ORDER — ONDANSETRON HCL 4 MG PO TABS: 4.0000 mg | ORAL_TABLET | Freq: Four times a day (QID) | ORAL | Status: DC | PRN

## 2017-10-21 MED ORDER — ACETAMINOPHEN 325 MG PO TABS
650.0000 mg | ORAL_TABLET | Freq: Once | ORAL | Status: AC
  Administered 2017-10-21: 650 mg via ORAL
  Filled 2017-10-21: qty 2

## 2017-10-21 MED ORDER — SODIUM CHLORIDE 0.9 % IV BOLUS
1000.0000 mL | Freq: Once | INTRAVENOUS | Status: AC
  Administered 2017-10-22: 1000 mL via INTRAVENOUS

## 2017-10-21 MED ORDER — SODIUM CHLORIDE 0.9 % IV SOLN
2.0000 g | INTRAVENOUS | Status: DC
  Administered 2017-10-22 – 2017-10-25 (×4): 2 g via INTRAVENOUS
  Filled 2017-10-21 (×4): qty 20

## 2017-10-21 MED ORDER — ACETAMINOPHEN 325 MG PO TABS
650.0000 mg | ORAL_TABLET | Freq: Four times a day (QID) | ORAL | Status: DC | PRN
  Administered 2017-10-22 – 2017-10-24 (×2): 650 mg via ORAL
  Filled 2017-10-21 (×2): qty 2

## 2017-10-21 MED ORDER — SODIUM CHLORIDE 0.9 % IV SOLN
INTRAVENOUS | Status: DC
Start: 2017-10-21 — End: 2017-10-24
  Administered 2017-10-22 – 2017-10-24 (×6): via INTRAVENOUS

## 2017-10-21 MED ORDER — SODIUM CHLORIDE 0.9 % IV BOLUS
1000.0000 mL | Freq: Once | INTRAVENOUS | Status: AC
  Administered 2017-10-21: 1000 mL via INTRAVENOUS

## 2017-10-21 MED ORDER — OXYCODONE-ACETAMINOPHEN 5-325 MG PO TABS
2.0000 | ORAL_TABLET | ORAL | Status: DC | PRN
  Administered 2017-10-22 (×3): 2 via ORAL
  Administered 2017-10-23: 1 via ORAL
  Administered 2017-10-23 (×2): 2 via ORAL
  Filled 2017-10-21 (×7): qty 2

## 2017-10-21 MED ORDER — LORATADINE 10 MG PO TABS: 10.0000 mg | ORAL_TABLET | Freq: Every day | ORAL | Status: DC | PRN

## 2017-10-21 MED ORDER — MORPHINE SULFATE (PF) 4 MG/ML IV SOLN
4.0000 mg | INTRAVENOUS | Status: DC | PRN
  Administered 2017-10-24: 4 mg via INTRAVENOUS
  Filled 2017-10-21: qty 1

## 2017-10-21 NOTE — ED Triage Notes (Signed)
Pt presents to ED for assessment of 1 week of leg swelling with a hx of lymphedema.  Patient now having emesis, fevers, diarrhea, and her skin is ripping on her swollen leg.  Leg is three times the size of the other.  Tachy and hypotensive in triage.

## 2017-10-21 NOTE — ED Notes (Signed)
ED Provider at bedside. 
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
## 2017-10-21 NOTE — ED Notes (Signed)
Admitting MD NIU at bedside
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
## 2017-10-22 ENCOUNTER — Other Ambulatory Visit: Payer: Self-pay

## 2017-10-22 ENCOUNTER — Encounter (HOSPITAL_COMMUNITY): Payer: Self-pay

## 2017-10-22 ENCOUNTER — Inpatient Hospital Stay (HOSPITAL_COMMUNITY): Payer: Medicare Other

## 2017-10-22 DIAGNOSIS — R609 Edema, unspecified: Secondary | ICD-10-CM

## 2017-10-22 LAB — LIPID PANEL
CHOL/HDL RATIO: 4.6 ratio
CHOLESTEROL: 162 mg/dL (ref 0–200)
HDL: 35 mg/dL — AB (ref >40)
LDL Cholesterol: 108 mg/dL — ABNORMAL HIGH (ref 0–99)
Triglycerides: 97 mg/dL (ref <150)
VLDL: 19 mg/dL (ref 0–40)

## 2017-10-22 LAB — URINALYSIS, ROUTINE W REFLEX MICROSCOPIC
Bilirubin Urine: NEGATIVE
Glucose, UA: NEGATIVE mg/dL
Ketones, ur: NEGATIVE mg/dL
Nitrite: NEGATIVE
PH: 5 (ref 5.0–8.0)
Protein, ur: NEGATIVE mg/dL
Specific Gravity, Urine: 1.029 (ref 1.005–1.030)
WBC, UA: >50 WBC/hpf — ABNORMAL HIGH (ref 0–5)

## 2017-10-22 LAB — BASIC METABOLIC PANEL
ANION GAP: 8 (ref 5–15)
BUN: 5 mg/dL — ABNORMAL LOW (ref 6–20)
CALCIUM: 7.8 mg/dL — AB (ref 8.9–10.3)
CO2: 21 mmol/L — AB (ref 22–32)
CREATININE: 0.94 mg/dL (ref 0.44–1.00)
Chloride: 107 mmol/L (ref 98–111)
Glucose, Bld: 112 mg/dL — ABNORMAL HIGH (ref 70–99)
Potassium: 3.6 mmol/L (ref 3.5–5.1)
SODIUM: 136 mmol/L (ref 135–145)

## 2017-10-22 LAB — CBC
HCT: 33.4 % — ABNORMAL LOW (ref 36.0–46.0)
HEMOGLOBIN: 10.4 g/dL — AB (ref 12.0–15.0)
MCH: 26 pg (ref 26.0–34.0)
MCHC: 31.1 g/dL (ref 30.0–36.0)
MCV: 83.5 fL (ref 78.0–100.0)
PLATELETS: 238 10*3/uL (ref 150–400)
RBC: 4 MIL/uL (ref 3.87–5.11)
RDW: 15.6 % — ABNORMAL HIGH (ref 11.5–15.5)
WBC: 17.7 10*3/uL — AB (ref 4.0–10.5)

## 2017-10-22 LAB — HIV ANTIBODY (ROUTINE TESTING W REFLEX): HIV Screen 4th Generation wRfx: NONREACTIVE

## 2017-10-22 LAB — SEDIMENTATION RATE: SED RATE: 34 mm/h — AB (ref 0–22)

## 2017-10-22 LAB — C-REACTIVE PROTEIN: CRP: 4.7 mg/dL — ABNORMAL HIGH (ref <1.0)

## 2017-10-22 LAB — PROCALCITONIN: Procalcitonin: 0.39 ng/mL

## 2017-10-22 LAB — LACTIC ACID, PLASMA
LACTIC ACID, VENOUS: 1.4 mmol/L (ref 0.5–1.9)
Lactic Acid, Venous: 1.8 mmol/L (ref 0.5–1.9)

## 2017-10-22 MED ORDER — SODIUM CHLORIDE 0.9 % IV BOLUS
500.0000 mL | Freq: Once | INTRAVENOUS | Status: AC
  Administered 2017-10-22: 500 mL via INTRAVENOUS

## 2017-10-22 NOTE — Progress Notes (Signed)
VASCULAR LAB PRELIMINARY  PRELIMINARY  PRELIMINARY  PRELIMINARY  Left lower extremity venous duplex completed.    Preliminary report:  There is no obvious evidence of DVT or SVT noted in the visualized veins of the left lower extremity.  Vasiliy Mccarry, RVT 10/22/2017, 3:04 PM

## 2017-10-22 NOTE — Progress Notes (Signed)
PROGRESS NOTE  [name] ZOX:[id number] DOB: 1985/09/13 DOA: 10/21/2017 PCP: Kerri Perches, MD  HPI/Recap of past 51 hours: 32 year old female with past medical history of left leg lymphedema, morbid obesity and polycystic ovarian disease admitted on 6/29 for 1 week of left leg swelling, pain and fever.  In the emergency room, patient found to be in sepsis secondary to left leg cellulitis.  Started on IV fluids, antibiotics and admitted to the hospitalist service.  Overnight no issues.  Patient complains of continued left leg pain, only a little bit better.  Lactic acid level normalized and white count has come down slightly.  Lower extremity Doppler negative for DVT.  Assessment/Plan: Principal Problem: Sepsis secondary to left leg cellulitis: Patient meets criteria for sepsis on admission given lactic acidosis, leukocytosis, skin cellulitis source along with fever and tachycardia.  Lactic acid level now at 1.4 and white count has improved so sepsis felt to be resolved.  Patient has several drug allergies so currently on IV Rocephin after receiving a dose of clindamycin and cefepime in the emergency room.  She is having slow improvement. Active Problems:   Lymphedema   Hyperlipidemia LDL goal <100 Morbid obesity: Patient meets criteria with BMI greater than 40.   Code Status: Full code  Family Communication: Family present  Disposition Plan: Continue inpatient until white count normalized and can be able to change over to p.o. antibiotics   Consultants:  None  Procedures:  Lower extremity Dopplers: No evidence of DVT  Antimicrobials:  IV cefepime 6/29 only  IV Rocephin 6/30-present  IV clindamycin 6/29 only  DVT prophylaxis: Lovenox   Objective: Vitals:   10/22/17 0726 10/22/17 0936  BP:  92/64  Pulse:  (!) 101  Resp:  20  Temp: 99.6 F (37.6 C) 98.6 F (37 C)  SpO2:  99%    Intake/Output Summary (Last 24 hours) at 10/22/2017 1521 Last data  filed at 10/22/2017 1518 Gross per 24 hour  Intake 1570.4 ml  Output 600 ml  Net 970.4 ml   Filed Weights   10/22/17 0000 10/22/17 0100  Weight: (!) 161.5 kg (356 lb) (!) 163.1 kg (359 lb 9.1 oz)   Body mass index is 53.1 kg/m.  Exam:   General: Alert and oriented x3, no acute distress  HEENT: Normocephalic, atraumatic, mucous members slightly dry  Neck: Thick, narrow airway  Cardiovascular: Regular rate and rhythm, S1-S2  Respiratory: Clear to auscultation bilaterally  Abdomen: Soft, nontender, nondistended, positive bowel sounds  Musculoskeletal: Left lower extremity with 2-3+ pitting edema, chronic lymphedema warm to touch and tender in the anterior aspect all the way from the knee down  Skin: As above with chronic lymphedema, warm to touch  Psychiatry: Flattened affect  Neuro: No focal deficits   Data Reviewed: CBC: Recent Labs  Lab 10/21/17 2152 10/22/17 0426  WBC 19.7* 17.7*  NEUTROABS 18.1*  --   HGB 12.1 10.4*  HCT 38.7 33.4*  MCV 82.5 83.5  PLT 304 238   Basic Metabolic Panel: Recent Labs  Lab 10/21/17 2152 10/22/17 0426  NA 136 136  K 3.9 3.6  CL 105 107  CO2 22 21*  GLUCOSE 106* 112*  BUN 6 5*  CREATININE 1.03* 0.94  CALCIUM 9.2 7.8*   GFR: Estimated Creatinine Clearance: 139.8 mL/min (by C-G formula based on SCr of 0.94 mg/dL). Liver Function Tests: Recent Labs  Lab 10/21/17 2152  AST 20  ALT 20  ALKPHOS 54  BILITOT 0.5  PROT 7.0  ALBUMIN  3.6   No results for input(s): LIPASE, AMYLASE in the last 168 hours. No results for input(s): AMMONIA in the last 168 hours. Coagulation Profile: Recent Labs  Lab 10/21/17 2152  INR 1.07   Cardiac Enzymes: No results for input(s): CKTOTAL, CKMB, CKMBINDEX, TROPONINI in the last 168 hours. BNP (last 3 results) No results for input(s): PROBNP in the last 8760 hours. HbA1C: No results for input(s): HGBA1C in the last 72 hours. CBG: No results for input(s): GLUCAP in the last 168  hours. Lipid Profile: Recent Labs    10/22/17 0426  CHOL 162  HDL 35*  LDLCALC 108*  TRIG 97  CHOLHDL 4.6   Thyroid Function Tests: No results for input(s): TSH, T4TOTAL, FREET4, T3FREE, THYROIDAB in the last 72 hours. Anemia Panel: No results for input(s): VITAMINB12, FOLATE, FERRITIN, TIBC, IRON, RETICCTPCT in the last 72 hours. Urine analysis:    Component Value Date/Time   COLORURINE YELLOW 10/21/2017 2305   APPEARANCEUR HAZY (A) 10/21/2017 2305   LABSPEC 1.029 10/21/2017 2305   PHURINE 5.0 10/21/2017 2305   GLUCOSEU NEGATIVE 10/21/2017 2305   HGBUR SMALL (A) 10/21/2017 2305   BILIRUBINUR NEGATIVE 10/21/2017 2305   KETONESUR NEGATIVE 10/21/2017 2305   PROTEINUR NEGATIVE 10/21/2017 2305   UROBILINOGEN 0.2 05/11/2013 1200   NITRITE NEGATIVE 10/21/2017 2305   LEUKOCYTESUR SMALL (A) 10/21/2017 2305   Sepsis Labs: @LABRCNTIP (procalcitonin:4,lacticidven:4)  ) Recent Results (from the past 240 hour(s))  Culture, blood (Routine x 2)     Status: None (Preliminary result)   Collection Time: 10/21/17  9:20 PM  Result Value Ref Range Status   Specimen Description BLOOD LEFT ARM  Final   Special Requests   Final    BOTTLES DRAWN AEROBIC AND ANAEROBIC Blood Culture results may not be optimal due to an excessive volume of blood received in culture bottles   Culture   Final    NO GROWTH < 24 HOURS Performed at [facility], [address] [place], [place] [address]    Report Status PENDING  Incomplete  Culture, blood (Routine x 2)     Status: None (Preliminary result)   Collection Time: 10/21/17  9:50 PM  Result Value Ref Range Status   Specimen Description BLOOD LEFT HAND  Final   Special Requests   Final    BOTTLES DRAWN AEROBIC ONLY Blood Culture results may not be optimal due to an excessive volume of blood received in culture bottles   Culture   Final    NO GROWTH < 24 HOURS Performed at [facility], [address] [place], [place] [address]    Report  Status PENDING  Incomplete      Studies: Dg Chest Port 1 View  Result Date: 10/21/2017 CLINICAL DATA:  32 year old female with shortness of breath. EXAM: PORTABLE CHEST 1 VIEW COMPARISON:  Chest radiograph dated 11/05/2011 FINDINGS: The heart size and mediastinal contours are within normal limits. Both lungs are clear. The visualized skeletal structures are unremarkable. IMPRESSION: No active disease. Electronically Signed   By: Elgie CollardArash  Radparvar M.D.   On: 10/21/2017 23:02    Scheduled Meds: . enoxaparin (LOVENOX) injection  80 mg Subcutaneous Q24H  . Norethin Ace-Eth Estrad-FE  1 tablet Oral Daily    Continuous Infusions: . sodium chloride 100 mL/hr at 10/22/17 1513  . cefTRIAXone (ROCEPHIN)  IV 2 g (10/22/17 0514)     LOS: 1 day     Hollice EspySendil K Keltin Baird, MD [facility]  To reach me or the  doctor on call, go to: [email or web address] Password [email or web address]  10/22/2017, 3:21 PM

## 2017-10-23 LAB — CBC WITH DIFFERENTIAL/PLATELET
ABS IMMATURE GRANULOCYTES: 0.1 10*3/uL (ref 0.0–0.1)
BASOS ABS: 0 10*3/uL (ref 0.0–0.1)
BASOS PCT: 0 %
EOS ABS: 0 10*3/uL (ref 0.0–0.7)
EOS PCT: 0 %
HCT: 31.3 % — ABNORMAL LOW (ref 36.0–46.0)
HEMOGLOBIN: 9.7 g/dL — AB (ref 12.0–15.0)
Immature Granulocytes: 1 %
Lymphocytes Relative: 19 %
Lymphs Abs: 1.8 10*3/uL (ref 0.7–4.0)
MCH: 25.7 pg — AB (ref 26.0–34.0)
MCHC: 31 g/dL (ref 30.0–36.0)
MCV: 83 fL (ref 78.0–100.0)
Monocytes Absolute: 0.7 10*3/uL (ref 0.1–1.0)
Monocytes Relative: 7 %
NEUTROS PCT: 73 %
Neutro Abs: 7.2 10*3/uL (ref 1.7–7.7)
Platelets: 206 10*3/uL (ref 150–400)
RBC: 3.77 MIL/uL — ABNORMAL LOW (ref 3.87–5.11)
RDW: 15.6 % — ABNORMAL HIGH (ref 11.5–15.5)
WBC: 9.9 10*3/uL (ref 4.0–10.5)

## 2017-10-23 LAB — CBC
HCT: 30.9 % — ABNORMAL LOW (ref 36.0–46.0)
HEMOGLOBIN: 9.6 g/dL — AB (ref 12.0–15.0)
MCH: 25.9 pg — AB (ref 26.0–34.0)
MCHC: 31.1 g/dL (ref 30.0–36.0)
MCV: 83.3 fL (ref 78.0–100.0)
PLATELETS: 233 10*3/uL (ref 150–400)
RBC: 3.71 MIL/uL — ABNORMAL LOW (ref 3.87–5.11)
RDW: 15.7 % — AB (ref 11.5–15.5)
WBC: 13 10*3/uL — AB (ref 4.0–10.5)

## 2017-10-23 LAB — BASIC METABOLIC PANEL
ANION GAP: 10 (ref 5–15)
Anion gap: 6 (ref 5–15)
BUN: <5 mg/dL — ABNORMAL LOW (ref 6–20)
BUN: <5 mg/dL — ABNORMAL LOW (ref 6–20)
CHLORIDE: 108 mmol/L (ref 98–111)
CO2: 21 mmol/L — ABNORMAL LOW (ref 22–32)
CO2: 25 mmol/L (ref 22–32)
Calcium: 8 mg/dL — ABNORMAL LOW (ref 8.9–10.3)
Calcium: 8.1 mg/dL — ABNORMAL LOW (ref 8.9–10.3)
Chloride: 106 mmol/L (ref 98–111)
Creatinine, Ser: 1.01 mg/dL — ABNORMAL HIGH (ref 0.44–1.00)
Creatinine, Ser: 0.91 mg/dL (ref 0.44–1.00)
GFR calc Af Amer: >60 mL/min (ref >60)
GFR calc Af Amer: >60 mL/min (ref >60)
GFR calc non Af Amer: >60 mL/min (ref >60)
Glucose, Bld: 95 mg/dL (ref 70–99)
Glucose, Bld: 126 mg/dL — ABNORMAL HIGH (ref 70–99)
POTASSIUM: 3.3 mmol/L — AB (ref 3.5–5.1)
POTASSIUM: 3.5 mmol/L (ref 3.5–5.1)
SODIUM: 137 mmol/L (ref 135–145)
SODIUM: 139 mmol/L (ref 135–145)

## 2017-10-23 LAB — LACTIC ACID, PLASMA: Lactic Acid, Venous: 1.2 mmol/L (ref 0.5–1.9)

## 2017-10-23 NOTE — Progress Notes (Signed)
Dr. Virginia RochesterSendil Krishnan returned page.  V/O Stat CBC. BMET. And Lactic acid. Also advised pt bp 87 systollic after percocet.

## 2017-10-23 NOTE — Progress Notes (Signed)
PROGRESS NOTE  Trilby LeaverCourtney D Rodrigue [id number]:[id number] DOB: 02/24/1986 DOA: 10/21/2017 PCP: Kerri PerchesSimpson, Margaret E, MD  HPI/Recap of past 6824 hours: 32 year old female with past medical history of left leg lymphedema, morbid obesity and polycystic ovarian disease admitted on 6/29 for 1 week of left leg swelling, pain and fever.  In the emergency room, patient found to be in sepsis secondary to left leg cellulitis.  Started on IV fluids, antibiotics and admitted to the hospitalist service.  Sepsis has since resolved.  Lower extremity Doppler negative for DVT.  Overnight no issues.  White count continues to improve.  Patient continues to have left leg pain, worse when she went from a lying position with blood flowing down to her leg.   Assessment/Plan: Principal Problem: Sepsis secondary to left leg cellulitis: Patient meets criteria for sepsis on admission given lactic acidosis, leukocytosis, skin cellulitis source along with fever and tachycardia.  Vital signs stable, lactic acid level normalized and sepsis resolved.  Patient has several drug allergies so currently on IV Rocephin after receiving a dose of clindamycin and cefepime in the emergency room.  White count much improved, continue current treatment plan. Active Problems:   Lymphedema   Hyperlipidemia LDL goal <100 Morbid obesity: Patient meets criteria with BMI greater than 40.   Code Status: Full code  Family Communication: Family present  Disposition Plan: Continue inpatient until white count normalized and can be able to change over to p.o. antibiotics   Consultants:  None  Procedures:  Lower extremity Dopplers: No evidence of DVT  Antimicrobials:  IV cefepime 6/29 only  IV Rocephin 6/30-present  IV clindamycin 6/29 only  DVT prophylaxis: Lovenox   Objective: Vitals:   10/23/17 0417 10/23/17 1115  BP: (!) 91/42 117/74  Pulse: 100 95  Resp: 20 18  Temp: 99.4 F (37.4 C) 98.4 F (36.9 C)  SpO2: 96% 97%     Intake/Output Summary (Last 24 hours) at 10/23/2017 1338 Last data filed at 10/23/2017 0600 Gross per 24 hour  Intake 50 ml  Output 1200 ml  Net -1150 ml   Filed Weights   10/22/17 0000 10/22/17 0100 10/22/17 2059  Weight: (!) 161.5 kg (356 lb) (!) 163.1 kg (359 lb 9.1 oz) (!) 166.1 kg (366 lb 2.9 oz)   Body mass index is 54.08 kg/m.  Exam:   General: Alert and oriented x3, no acute distress  HEENT: Normocephalic, atraumatic, mucous members slightly dry  Neck: Thick, narrow airway  Cardiovascular: Regular rate and rhythm, S1-S2  Respiratory: Clear to auscultation bilaterally  Abdomen: Soft, nontender, nondistended, positive bowel sounds  Musculoskeletal: Left lower extremity with 2-3+ pitting edema, chronic lymphedema warm to touch (as compared to right lower extremity ) and tender in the anterior aspect all the way from the knee down  Skin: As above with chronic lymphedema, warm to touch  Psychiatry: Flattened affect  Neuro: No focal deficits   Data Reviewed: CBC: Recent Labs  Lab 10/21/17 2152 10/22/17 0426 10/23/17 0450  WBC 19.7* 17.7* 13.0*  NEUTROABS 18.1*  --   --   HGB 12.1 10.4* 9.6*  HCT 38.7 33.4* 30.9*  MCV 82.5 83.5 83.3  PLT 304 238 233   Basic Metabolic Panel: Recent Labs  Lab 10/21/17 2152 10/22/17 0426 10/23/17 0450  NA 136 136 137  K 3.9 3.6 3.3*  CL 105 107 106  CO2 22 21* 21*  GLUCOSE 106* 112* 95  BUN 6 5* <5*  CREATININE 1.03* 0.94 1.01*  CALCIUM 9.2 7.8* 8.0*  GFR: Estimated Creatinine Clearance: 131.6 mL/min (A) (by C-G formula based on SCr of 1.01 mg/dL (H)). Liver Function Tests: Recent Labs  Lab 10/21/17 2152  AST 20  ALT 20  ALKPHOS 54  BILITOT 0.5  PROT 7.0  ALBUMIN 3.6   No results for input(s): LIPASE, AMYLASE in the last 168 hours. No results for input(s): AMMONIA in the last 168 hours. Coagulation Profile: Recent Labs  Lab 10/21/17 2152  INR 1.07   Cardiac Enzymes: No results for input(s):  CKTOTAL, CKMB, CKMBINDEX, TROPONINI in the last 168 hours. BNP (last 3 results) No results for input(s): PROBNP in the last 8760 hours. HbA1C: No results for input(s): HGBA1C in the last 72 hours. CBG: No results for input(s): GLUCAP in the last 168 hours. Lipid Profile: Recent Labs    10/22/17 0426  CHOL 162  HDL 35*  LDLCALC 108*  TRIG 97  CHOLHDL 4.6   Thyroid Function Tests: No results for input(s): TSH, T4TOTAL, FREET4, T3FREE, THYROIDAB in the last 72 hours. Anemia Panel: No results for input(s): VITAMINB12, FOLATE, FERRITIN, TIBC, IRON, RETICCTPCT in the last 72 hours. Urine analysis:    Component Value Date/Time   COLORURINE YELLOW 10/21/2017 2305   APPEARANCEUR HAZY (A) 10/21/2017 2305   LABSPEC 1.029 10/21/2017 2305   PHURINE 5.0 10/21/2017 2305   GLUCOSEU NEGATIVE 10/21/2017 2305   HGBUR SMALL (A) 10/21/2017 2305   BILIRUBINUR NEGATIVE 10/21/2017 2305   KETONESUR NEGATIVE 10/21/2017 2305   PROTEINUR NEGATIVE 10/21/2017 2305   UROBILINOGEN 0.2 05/11/2013 1200   NITRITE NEGATIVE 10/21/2017 2305   LEUKOCYTESUR SMALL (A) 10/21/2017 2305   Sepsis Labs: @LABRCNTIP (procalcitonin:4,lacticidven:4)  ) Recent Results (from the past 240 hour(s))  Culture, blood (Routine x 2)     Status: None (Preliminary result)   Collection Time: 10/21/17  9:20 PM  Result Value Ref Range Status   Specimen Description BLOOD LEFT ARM  Final   Special Requests   Final    BOTTLES DRAWN AEROBIC AND ANAEROBIC Blood Culture results may not be optimal due to an excessive volume of blood received in culture bottles   Culture   Final    NO GROWTH 2 DAYS Performed at [facility], [address], [place], [place] [address]    Report Status PENDING  Incomplete  Culture, blood (Routine x 2)     Status: None (Preliminary result)   Collection Time: 10/21/17  9:50 PM  Result Value Ref Range Status   Specimen Description BLOOD LEFT HAND  Final   Special Requests   Final    BOTTLES DRAWN  AEROBIC ONLY Blood Culture results may not be optimal due to an excessive volume of blood received in culture bottles   Culture   Final    NO GROWTH 2 DAYS Performed at [facility], [address], [place], [place] [address]    Report Status PENDING  Incomplete      Studies: No results found.  Scheduled Meds: . enoxaparin (LOVENOX) injection  80 mg Subcutaneous Q24H  . Norethin Ace-Eth Estrad-FE  1 tablet Oral Daily    Continuous Infusions: . sodium chloride 100 mL/hr at 10/23/17 0311  . cefTRIAXone (ROCEPHIN)  IV 2 g (10/23/17 0520)     LOS: 2 days     Hollice Espy, MD [facility]  To reach me or the doctor on call, go to: [email or web address] Password [email or web address]  10/23/2017, 1:38 PM

## 2017-10-23 NOTE — Progress Notes (Signed)
Paged Dr. Rito EhrlichKrishnan, pt has vomited x 2 today, possibly percocet.  Pt also states left leg is bigger today than yesterday.  FYI.

## 2017-10-24 MED ORDER — MORPHINE SULFATE (PF) 2 MG/ML IV SOLN: 1.0000 mg | INTRAVENOUS | Status: DC | PRN

## 2017-10-24 NOTE — Progress Notes (Signed)
PROGRESS NOTE  [name]:[id number] DOB: Feb 27, 1986 DOA: 10/21/2017 PCP: Kerri Perches, MD  HPI/Recap of past 70 hours: 32 year old female with past medical history of left leg lymphedema, morbid obesity and polycystic ovarian disease admitted on 6/29 for 1 week of left leg swelling, pain and fever.  In the emergency room, patient found to be in sepsis secondary to left leg cellulitis.  Started on IV fluids, antibiotics and admitted to the hospitalist service.  Sepsis has since resolved.  Lower extremity Doppler negative for DVT.  Over the next few days, patient has responded to IV Rocephin and white count as of 7/2 now normalized.  She continues to have some left leg pain although the last 24 hours it has improved.  Attempts to take Percocet led to hypotension and vomiting although she has a better response with IV morphine.    Assessment/Plan: Principal Problem: Sepsis secondary to left leg cellulitis: Patient meets criteria for sepsis on admission given lactic acidosis, leukocytosis, skin cellulitis source along with fever and tachycardia.  Vital signs stable, lactic acid level normalized and sepsis resolved.  Patient has several drug allergies so currently on IV Rocephin after receiving a dose of clindamycin and cefepime in the emergency room.  With white count normalized, will change over to p.o. and plan for discharge in the morning.  Swelling and tenderness slightly improved from previous day.  By tomorrow, should be enough or patient can be discharged on p.o. pain medication. Active Problems:   Lymphedema   Hyperlipidemia LDL goal <100 Morbid obesity: Patient meets criteria with BMI greater than 40. Nausea/vomiting: Looks to be response to [employer].  Could try Ultram or OxyIR for discharge   Code Status: Full code  Family Communication: Left message for family  Disposition Plan: Plan to discharge tomorrow   Consultants:  None  Procedures:  Lower extremity  Dopplers: No evidence of DVT  Antimicrobials:  IV cefepime 6/29 only  IV Rocephin 6/30-present  IV clindamycin 6/29 only  DVT prophylaxis: Lovenox   Objective: Vitals:   10/24/17 0553 10/24/17 0906  BP: (!) 115/56 109/70  Pulse: 88 83  Resp: 16 (!) 26  Temp: 98.4 F (36.9 C) 98.9 F (37.2 C)  SpO2: 95% 96%    Intake/Output Summary (Last 24 hours) at 10/24/2017 1448 Last data filed at 10/24/2017 0600 Gross per 24 hour  Intake 2747.42 ml  Output -  Net 2747.42 ml   Filed Weights   10/22/17 0000 10/22/17 0100 10/22/17 2059  Weight: (!) 161.5 kg (356 lb) (!) 163.1 kg (359 lb 9.1 oz) (!) 166.1 kg (366 lb 2.9 oz)   Body mass index is 54.08 kg/m.  Exam:   General: Alert and oriented x3, no acute distress  HEENT: Normocephalic, atraumatic, mucous members are moist  Neck: Thick, narrow airway  Cardiovascular: Regular rate and rhythm, S1-S2  Respiratory: Clear to auscultation bilaterally  Abdomen: Soft, nontender, nondistended, positive bowel sounds  Musculoskeletal: Left lower extremity with 1-2+ pitting edema pitting edema, chronic lymphedema warm to touch (as compared to right lower extremity ) and tender in the anterior aspect all the way from the knee down.  Note that tenderness, warmth and swelling arm improved from previous day  Skin: As above with chronic lymphedema, warm to touch  Psychiatry: Appropriate, no evidence of psychoses  Neuro: No focal deficits   Data Reviewed: CBC: Recent Labs  Lab 10/21/17 2152 10/22/17 0426 10/23/17 0450 10/23/17 1841  WBC 19.7* 17.7* 13.0* 9.9  NEUTROABS 18.1*  --   --  7.2  HGB 12.1 10.4* 9.6* 9.7*  HCT 38.7 33.4* 30.9* 31.3*  MCV 82.5 83.5 83.3 83.0  PLT 304 238 233 206   Basic Metabolic Panel: Recent Labs  Lab 10/21/17 2152 10/22/17 0426 10/23/17 0450 10/23/17 1841  NA 136 136 137 139  K 3.9 3.6 3.3* 3.5  CL 105 107 106 108  CO2 22 21* 21* 25  GLUCOSE 106* 112* 95 126*  BUN 6 5* <5* <5*    CREATININE 1.03* 0.94 1.01* 0.91  CALCIUM 9.2 7.8* 8.0* 8.1*   GFR: Estimated Creatinine Clearance: 146 mL/min (by C-G formula based on SCr of 0.91 mg/dL). Liver Function Tests: Recent Labs  Lab 10/21/17 2152  AST 20  ALT 20  ALKPHOS 54  BILITOT 0.5  PROT 7.0  ALBUMIN 3.6   No results for input(s): LIPASE, AMYLASE in the last 168 hours. No results for input(s): AMMONIA in the last 168 hours. Coagulation Profile: Recent Labs  Lab 10/21/17 2152  INR 1.07   Cardiac Enzymes: No results for input(s): CKTOTAL, CKMB, CKMBINDEX, TROPONINI in the last 168 hours. BNP (last 3 results) No results for input(s): PROBNP in the last 8760 hours. HbA1C: No results for input(s): HGBA1C in the last 72 hours. CBG: No results for input(s): GLUCAP in the last 168 hours. Lipid Profile: Recent Labs    10/22/17 0426  CHOL 162  HDL 35*  LDLCALC 108*  TRIG 97  CHOLHDL 4.6   Thyroid Function Tests: No results for input(s): TSH, T4TOTAL, FREET4, T3FREE, THYROIDAB in the last 72 hours. Anemia Panel: No results for input(s): VITAMINB12, FOLATE, FERRITIN, TIBC, IRON, RETICCTPCT in the last 72 hours. Urine analysis:    Component Value Date/Time   COLORURINE YELLOW 10/21/2017 2305   APPEARANCEUR HAZY (A) 10/21/2017 2305   LABSPEC 1.029 10/21/2017 2305   PHURINE 5.0 10/21/2017 2305   GLUCOSEU NEGATIVE 10/21/2017 2305   HGBUR SMALL (A) 10/21/2017 2305   BILIRUBINUR NEGATIVE 10/21/2017 2305   KETONESUR NEGATIVE 10/21/2017 2305   PROTEINUR NEGATIVE 10/21/2017 2305   UROBILINOGEN 0.2 05/11/2013 1200   NITRITE NEGATIVE 10/21/2017 2305   LEUKOCYTESUR SMALL (A) 10/21/2017 2305   Sepsis Labs: @LABRCNTIP (procalcitonin:4,lacticidven:4)  ) Recent Results (from the past 240 hour(s))  Culture, blood (Routine x 2)     Status: None (Preliminary result)   Collection Time: 10/21/17  9:20 PM  Result Value Ref Range Status   Specimen Description BLOOD LEFT ARM  Final   Special Requests   Final     BOTTLES DRAWN AEROBIC AND ANAEROBIC Blood Culture results may not be optimal due to an excessive volume of blood received in culture bottles   Culture   Final    NO GROWTH 3 DAYS Performed at [facility], [address] [place], [place] [address]    Report Status PENDING  Incomplete  Culture, blood (Routine x 2)     Status: None (Preliminary result)   Collection Time: 10/21/17  9:50 PM  Result Value Ref Range Status   Specimen Description BLOOD LEFT HAND  Final   Special Requests   Final    BOTTLES DRAWN AEROBIC ONLY Blood Culture results may not be optimal due to an excessive volume of blood received in culture bottles   Culture   Final    NO GROWTH 3 DAYS Performed at [facility], [address] [place], [place] [address]    Report Status PENDING  Incomplete      Studies: No results found.  Scheduled Meds: .  enoxaparin (LOVENOX) injection  80 mg Subcutaneous Q24H  . Norethin Ace-Eth Estrad-FE  1 tablet Oral Daily    Continuous Infusions: . cefTRIAXone (ROCEPHIN)  IV 2 g (10/24/17 0602)     LOS: 3 days     Hollice Espy, MD [facility]  To reach me or the doctor on call, go to: [email or web address] Password [facility]  10/24/2017, 2:48 PM

## 2017-10-24 NOTE — Progress Notes (Signed)
Patient throw up breakfast this morning. She was given Zofran IV . Patient said that her pain is a 5. She state that morphine helps however, Percocet lowers her B/P.

## 2017-10-24 NOTE — Care Management Important Message (Signed)
Important Message  Patient Details  Name: Trilby LeaverCourtney D Buchan MRN: [id number] Date of Birth: 10/12/1985   Medicare Important Message Given:  Yes    Unity Luepke 10/24/2017, 3:10 PM

## 2017-10-25 DIAGNOSIS — E785 Hyperlipidemia, unspecified: Secondary | ICD-10-CM

## 2017-10-25 DIAGNOSIS — I890 Lymphedema, not elsewhere classified: Secondary | ICD-10-CM

## 2017-10-25 DIAGNOSIS — A419 Sepsis, unspecified organism: Principal | ICD-10-CM

## 2017-10-25 DIAGNOSIS — L03116 Cellulitis of left lower limb: Secondary | ICD-10-CM

## 2017-10-25 LAB — COMPREHENSIVE METABOLIC PANEL
ALBUMIN: 2.6 g/dL — AB (ref 3.5–5.0)
ALT: 27 U/L (ref 0–44)
ANION GAP: 11 (ref 5–15)
AST: 26 U/L (ref 15–41)
Alkaline Phosphatase: 44 U/L (ref 38–126)
CHLORIDE: 107 mmol/L (ref 98–111)
CO2: 24 mmol/L (ref 22–32)
Calcium: 8.4 mg/dL — ABNORMAL LOW (ref 8.9–10.3)
Creatinine, Ser: 0.91 mg/dL (ref 0.44–1.00)
GFR calc Af Amer: >60 mL/min (ref >60)
GFR calc non Af Amer: >60 mL/min (ref >60)
GLUCOSE: 94 mg/dL (ref 70–99)
POTASSIUM: 3.5 mmol/L (ref 3.5–5.1)
SODIUM: 142 mmol/L (ref 135–145)
TOTAL PROTEIN: 6 g/dL — AB (ref 6.5–8.1)
Total Bilirubin: 0.5 mg/dL (ref 0.3–1.2)

## 2017-10-25 LAB — CBC WITH DIFFERENTIAL/PLATELET
Abs Immature Granulocytes: 0.1 10*3/uL (ref 0.0–0.1)
BASOS ABS: 0 10*3/uL (ref 0.0–0.1)
Basophils Relative: 0 %
EOS ABS: 0.1 10*3/uL (ref 0.0–0.7)
EOS PCT: 1 %
HEMATOCRIT: 29.6 % — AB (ref 36.0–46.0)
Hemoglobin: 9.2 g/dL — ABNORMAL LOW (ref 12.0–15.0)
Immature Granulocytes: 1 %
LYMPHS ABS: 2.5 10*3/uL (ref 0.7–4.0)
Lymphocytes Relative: 28 %
MCH: 25.7 pg — ABNORMAL LOW (ref 26.0–34.0)
MCHC: 31.1 g/dL (ref 30.0–36.0)
MCV: 82.7 fL (ref 78.0–100.0)
Monocytes Absolute: 0.7 10*3/uL (ref 0.1–1.0)
Monocytes Relative: 8 %
NEUTROS PCT: 62 %
Neutro Abs: 5.6 10*3/uL (ref 1.7–7.7)
PLATELETS: 282 10*3/uL (ref 150–400)
RBC: 3.58 MIL/uL — AB (ref 3.87–5.11)
RDW: 15.6 % — AB (ref 11.5–15.5)
WBC: 8.8 10*3/uL (ref 4.0–10.5)

## 2017-10-25 LAB — MAGNESIUM: Magnesium: 1.8 mg/dL (ref 1.7–2.4)

## 2017-10-25 LAB — PHOSPHORUS: Phosphorus: 4 mg/dL (ref 2.5–4.6)

## 2017-10-25 MED ORDER — CEPHALEXIN 500 MG PO CAPS
500.0000 mg | ORAL_CAPSULE | Freq: Four times a day (QID) | ORAL | Status: DC
  Administered 2017-10-25: 500 mg via ORAL
  Filled 2017-10-25: qty 1

## 2017-10-25 MED ORDER — TRAMADOL HCL 50 MG PO TABS: 50.0000 mg | ORAL_TABLET | Freq: Four times a day (QID) | ORAL | Status: DC | PRN

## 2017-10-25 MED ORDER — CEPHALEXIN 500 MG PO CAPS: 500.0000 mg | ORAL_CAPSULE | Freq: Four times a day (QID) | ORAL | 0 refills | Status: AC

## 2017-10-25 MED ORDER — HYDROCERIN EX CREA
TOPICAL_CREAM | Freq: Two times a day (BID) | CUTANEOUS | Status: DC
  Administered 2017-10-25: 13:00:00 via TOPICAL
  Filled 2017-10-25: qty 113

## 2017-10-25 MED ORDER — TRAMADOL HCL 50 MG PO TABS: 50.0000 mg | ORAL_TABLET | Freq: Four times a day (QID) | ORAL | 0 refills | Status: DC | PRN

## 2017-10-25 MED ORDER — HYDROCERIN EX CREA: 1.0000 "application " | TOPICAL_CREAM | Freq: Two times a day (BID) | CUTANEOUS | 0 refills | Status: DC

## 2017-10-25 MED ORDER — ONDANSETRON HCL 4 MG PO TABS: 4.0000 mg | ORAL_TABLET | Freq: Four times a day (QID) | ORAL | 0 refills | Status: DC | PRN

## 2017-10-25 NOTE — Consult Note (Signed)
WOC Nurse wound consult note Reason for Consult: skin fold issues related to lymphedema  Wound type: No real wounds or openings noted today. She has some minor skin irritation under the skin that hangs over her foot as a result of her unilateral lymphedema.  She is very knowledgeable on her lymphedema.  She has been seen in the [facility] outpatient [facility] sometime in the past. She reports that her referral ran out over a year ago and she has not been back. Her compression wraps have stretched out and her pumps are non functional.  I have explained that long term maintenance of her lymphedema is the only way to keep this disease under control and make sure that she does not have skin changes or weeping open wounds. She is very knowledgeable of the above.  Pressure Injury POA: NA Ordered Eucerin to keep the skin from cracking under the skin fold related to her lymphedema. Contacted [facility] to make her an appointment.  Appointment scheduled: [address] [place], [place] [address] [phone]  July 11th, 2019 at 11:00am Provided patient with card with appointment listed for her. CM in the room with me to discuss POC.  Discussed POC with patient and bedside nurse.  Re consult if needed, will not follow at this time. Thanks  Fran Neiswonger M.D.[employer] MSN, RN,CWOCN, CNS, CWON-AP [phone])

## 2017-10-25 NOTE — Progress Notes (Signed)
Dr. Lisabeth RegisterSheik returned page and stated he spoke with pharmacy and stated she has tolerated rocephin well and was should tolerate Keflex.  Dr. Lisabeth RegisterSheik spoke with patient and both agreed to continue with Keflex.

## 2017-10-25 NOTE — Discharge Summary (Signed)
Physician Discharge Summary  [name]:[id number] DOB: [birth year] DOA: 10/21/2017  PCP: Kerri Perches, MD  Admit date: 10/21/2017 Discharge date: 10/25/2017  Admitted From: Home Disposition: Home  Recommendations for Outpatient Follow-up:  1. Follow up with PCP Dr. Rutherford Limerick in 1-2 weeks 2. Follow up with Lymphedema Clinic at [facility] on 11/02/17 at 11:00 AM 3. Please obtain CMP/CBC, Mag, Phos in one week 4. Please follow up on the following pending results:  Home Health: No Equipment/Devices: None  Discharge Condition: Stable CODE STATUS: FULL CODE Diet recommendation:   Brief/Interim Summary: 32 year old female with past medical history of left leg lymphedema, morbid obesity and polycystic ovarian disease admitted on 6/29 for 1 week of left leg swelling, pain and fever. In the emergency room, patient found to be in sepsis secondary to left leg cellulitis.  Started on IV fluids, antibiotics and admitted to the hospitalist service.  Sepsis has since resolved.  Lower extremity Doppler negative for DVT.  Over the next few days, patient has responded to IV Rocephin and white count as of 7/2 now normalized.  She continues to have some left leg pain although the last 24 hours it has improved.  Attempts to take Percocet led to hypotension and vomiting although she has a better response with IV morphine. She was changed to po Keflex at D/C and was set up with the Lymphedema Clinic by [facility] nurse. She will need to follow up with PCP at D/C and complete Abx Course.    Discharge Diagnoses:  Principal Problem:   Left leg cellulitis Active Problems:   Morbid obesity (HCC)   Lymphedema   Hyperlipidemia LDL goal <100   Sepsis (HCC)  Sepsis secondary to left leg cellulitis -Patient meets criteria for sepsis on admission given lactic acidosis, leukocytosis, skin cellulitis source along with fever and tachycardia.  -Vital signs stable, lactic acid level normalized and sepsis  resolved.  Patient has several drug allergies so currently on IV Rocephin after receiving a dose of clindamycin and cefepime in the emergency room and will change to po Keflex -LA went from 2.38 -> 1.2 -CRP was 4.7 -Swelling and tenderness slightly improved from previous day.  -Place on Tramadol for Pain   Lymphedema -WOC Nurse Consulted  -[facility]  Hyperlipidemia -Lipid Panel showed Cholesterol of 162, HDL of 35, LDL of 108, TG of 97, and VLDL of 19  Morbid Obesity -Patient meets criteria with BMI greater than 40. -Weight Loss Counseling Given   Nausea/Vomiting -Looks to be response to Percocet.  -Improved  -Will give Tramadol for Pain as Percocet caused N/V  Hypokalemia -Improved -Patient's K+ was 3.5 at D/C -Follow up with PCP Dr. Rutherford Limerick as an outpatient   Discharge Instructions  Discharge Instructions    Call MD for:  difficulty breathing, headache or visual disturbances   Complete by:  As directed    Call MD for:  extreme fatigue   Complete by:  As directed    Call MD for:  hives   Complete by:  As directed    Call MD for:  persistant dizziness or light-headedness   Complete by:  As directed    Call MD for:  persistant nausea and vomiting   Complete by:  As directed    Call MD for:  redness, tenderness, or signs of infection (pain, swelling, redness, odor or green/yellow discharge around incision site)   Complete by:  As directed    Call MD for:  severe uncontrolled pain   Complete by:  As directed    Call MD for:  temperature >100.4   Complete by:  As directed    Diet - low sodium heart healthy   Complete by:  As directed    Discharge instructions   Complete by:  As directed    Follow-up with primary care physician as well as the lymphedema clinic in [place].  Patient is as prescribed.  If symptoms change or worsen please return to emergency room for evaluation.   Increase activity slowly   Complete by:  As directed       Allergies as of 10/25/2017      Reactions   Bee Venom Anaphylaxis, Swelling   Septra [sulfamethoxazole-trimethoprim] Other (See Comments)   Vomit and loose stools   Other Itching, Rash, Other (See Comments)   Cannot tolerate any med that end in -CILLIN; also develops listers on her scalp   Penicillins Rash   Has patient had a PCN reaction causing immediate rash, facial/tongue/throat swelling, SOB or lightheadedness with hypotension: yes Has patient had a PCN reaction causing severe rash involving mucus membranes or skin necrosis: Unknown Has patient had a PCN reaction that required hospitalization: Unknown Has patient had a PCN reaction occurring within the last 10 years: yes If all of the above answers are "NO", then may proceed with Cephalosporin use. Tolerated Cefepime and Rocephin in the past.   Vancomycin Itching, Rash      Medication List    STOP taking these medications   cyclobenzaprine 5 MG tablet Commonly known as:  FLEXERIL   predniSONE 20 MG tablet Commonly known as:  DELTASONE     TAKE these medications   cephALEXin 500 MG capsule Commonly known as:  KEFLEX Take 1 capsule (500 mg total) by mouth every 6 (six) hours for 12 doses.   hydrocerin Crea Apply 1 application topically 2 (two) times daily.   loratadine 10 MG tablet Commonly known as:  CLARITIN Take 10 mg by mouth daily as needed for allergies.   naproxen sodium 220 MG tablet Commonly known as:  ALEVE Take 220 mg by mouth 2 (two) times daily as needed (FOR PAIN).   Norethin Ace-Eth Estrad-FE 1-20 MG-MCG(24) Caps Commonly known as:  TAYTULLA Take 1 tablet by mouth daily.   ondansetron 4 MG tablet Commonly known as:  ZOFRAN Take 1 tablet (4 mg total) by mouth every 6 (six) hours as needed for nausea.   traMADol 50 MG tablet Commonly known as:  ULTRAM Take 1 tablet (50 mg total) by mouth every 6 (six) hours as needed for moderate pain.   VICKS VAPOINHALER IN Place 1 each into both nostrils  daily as needed (FOR ALLERGIES/CONGESTION).      Follow-up Information    Wilson Singer, MD Follow up.   Specialty:  Internal Medicine Why:  Follow up within 1 week Contact information: [address] [place] [phone]          Allergies  Allergen Reactions  . Bee Venom Anaphylaxis and Swelling  . Septra [Sulfamethoxazole-Trimethoprim] Other (See Comments)    Vomit and loose stools  . Other Itching, Rash and Other (See Comments)    Cannot tolerate any med that end in -CILLIN; also develops listers on her scalp  . Penicillins Rash    Has patient had a PCN reaction causing immediate rash, facial/tongue/throat swelling, SOB or lightheadedness with hypotension: yes Has patient had a PCN reaction causing severe rash involving mucus membranes  or skin necrosis: Unknown Has patient had a PCN reaction that required hospitalization: Unknown Has patient had a PCN reaction occurring within the last 10 years: yes If all of the above answers are "NO", then may proceed with Cephalosporin use. Tolerated Cefepime and Rocephin in the past.  . Vancomycin Itching and Rash   Consultations:  None  Procedures/Studies: Dg Chest Port 1 View  Result Date: 10/21/2017 CLINICAL DATA:  32 year old female with shortness of breath. EXAM: PORTABLE CHEST 1 VIEW COMPARISON:  Chest radiograph dated 11/05/2011 FINDINGS: The heart size and mediastinal contours are within normal limits. Both lungs are clear. The visualized skeletal structures are unremarkable. IMPRESSION: No active disease. Electronically Signed   By: Elgie Collard M.D.   On: 10/21/2017 23:02   LE VENOUS DUPLEX -Preliminary report:  There is no obvious evidence of DVT or SVT noted in the visualized veins of the left lower extremity  Subjective:   Discharge Exam: Vitals:   10/24/17 2027 10/25/17 0454  BP: 124/76 123/85  Pulse: 87 89  Resp: 20 16  Temp: 98.8 F (37.1 C) 99.1 F (37.3 C)  SpO2: 97% 96%    Vitals:   10/24/17 0906 10/24/17 1844 10/24/17 2027 10/25/17 0454  BP: 109/70 (!) 86/54 124/76 123/85  Pulse: 83 72 87 89  Resp: (!) 26 (!) 24 20 16   Temp: 98.9 F (37.2 C) 98.6 F (37 C) 98.8 F (37.1 C) 99.1 F (37.3 C)  TempSrc: Oral Oral Oral Oral  SpO2: 96% 97% 97% 96%  Weight:      Height:       General: Pt is alert, awake, not in acute distress Cardiovascular: RRR, S1/S2 +, no rubs, no gallops Respiratory: Diminished bilaterally, no wheezing, no rhonchi Abdominal: Soft, NT, Distended 2/2 to body habitus, bowel sounds + Extremities: Left Leg with significant lymphedema, no cyanosis  The results of significant diagnostics from this hospitalization (including imaging, microbiology, ancillary and laboratory) are listed below for reference.    Microbiology: Recent Results (from the past 240 hour(s))  Culture, blood (Routine x 2)     Status: None (Preliminary result)   Collection Time: 10/21/17  9:20 PM  Result Value Ref Range Status   Specimen Description BLOOD LEFT ARM  Final   Special Requests   Final    BOTTLES DRAWN AEROBIC AND ANAEROBIC Blood Culture results may not be optimal due to an excessive volume of blood received in culture bottles   Culture   Final    NO GROWTH 4 DAYS Performed at [facility], [address], [place], [place] [address]    Report Status PENDING  Incomplete  Culture, blood (Routine x 2)     Status: None (Preliminary result)   Collection Time: 10/21/17  9:50 PM  Result Value Ref Range Status   Specimen Description BLOOD LEFT HAND  Final   Special Requests   Final    BOTTLES DRAWN AEROBIC ONLY Blood Culture results may not be optimal due to an excessive volume of blood received in culture bottles   Culture   Final    NO GROWTH 4 DAYS Performed at [facility], [address] [place], [place] [address]    Report Status PENDING  Incomplete    Labs: BNP (last 3 results) No results for input(s): BNP in the last 8760  hours. Basic Metabolic Panel: Recent Labs  Lab 10/21/17 2152 10/22/17 0426 10/23/17 0450 10/23/17 1841 10/25/17 0853  NA 136 136 137 139 142  K  3.9 3.6 3.3* 3.5 3.5  CL 105 107 106 108 107  CO2 22 21* 21* 25 24  GLUCOSE 106* 112* 95 126* 94  BUN 6 5* <5* <5* <5*  CREATININE 1.03* 0.94 1.01* 0.91 0.91  CALCIUM 9.2 7.8* 8.0* 8.1* 8.4*  MG  --   --   --   --  1.8  PHOS  --   --   --   --  4.0   Liver Function Tests: Recent Labs  Lab 10/21/17 2152 10/25/17 0853  AST 20 26  ALT 20 27  ALKPHOS 54 44  BILITOT 0.5 0.5  PROT 7.0 6.0*  ALBUMIN 3.6 2.6*   No results for input(s): LIPASE, AMYLASE in the last 168 hours. No results for input(s): AMMONIA in the last 168 hours. CBC: Recent Labs  Lab 10/21/17 2152 10/22/17 0426 10/23/17 0450 10/23/17 1841 10/25/17 0853  WBC 19.7* 17.7* 13.0* 9.9 8.8  NEUTROABS 18.1*  --   --  7.2 5.6  HGB 12.1 10.4* 9.6* 9.7* 9.2*  HCT 38.7 33.4* 30.9* 31.3* 29.6*  MCV 82.5 83.5 83.3 83.0 82.7  PLT 304 238 233 206 282   Cardiac Enzymes: No results for input(s): CKTOTAL, CKMB, CKMBINDEX, TROPONINI in the last 168 hours. BNP: Invalid input(s): POCBNP CBG: No results for input(s): GLUCAP in the last 168 hours. D-Dimer No results for input(s): DDIMER in the last 72 hours. Hgb A1c No results for input(s): HGBA1C in the last 72 hours. Lipid Profile No results for input(s): CHOL, HDL, LDLCALC, TRIG, CHOLHDL, LDLDIRECT in the last 72 hours. Thyroid function studies No results for input(s): TSH, T4TOTAL, T3FREE, THYROIDAB in the last 72 hours.  Invalid input(s): FREET3 Anemia work up No results for input(s): VITAMINB12, FOLATE, FERRITIN, TIBC, IRON, RETICCTPCT in the last 72 hours. Urinalysis    Component Value Date/Time   COLORURINE YELLOW 10/21/2017 2305   APPEARANCEUR HAZY (A) 10/21/2017 2305   LABSPEC 1.029 10/21/2017 2305   PHURINE 5.0 10/21/2017 2305   GLUCOSEU NEGATIVE 10/21/2017 2305   HGBUR SMALL (A) 10/21/2017 2305    BILIRUBINUR NEGATIVE 10/21/2017 2305   KETONESUR NEGATIVE 10/21/2017 2305   PROTEINUR NEGATIVE 10/21/2017 2305   UROBILINOGEN 0.2 05/11/2013 1200   NITRITE NEGATIVE 10/21/2017 2305   LEUKOCYTESUR SMALL (A) 10/21/2017 2305   Sepsis Labs Invalid input(s): PROCALCITONIN,  WBC,  LACTICIDVEN Microbiology Recent Results (from the past 240 hour(s))  Culture, blood (Routine x 2)     Status: None (Preliminary result)   Collection Time: 10/21/17  9:20 PM  Result Value Ref Range Status   Specimen Description BLOOD LEFT ARM  Final   Special Requests   Final    BOTTLES DRAWN AEROBIC AND ANAEROBIC Blood Culture results may not be optimal due to an excessive volume of blood received in culture bottles   Culture   Final    NO GROWTH 4 DAYS Performed at [facility], [address], [place], [place] [address]    Report Status PENDING  Incomplete  Culture, blood (Routine x 2)     Status: None (Preliminary result)   Collection Time: 10/21/17  9:50 PM  Result Value Ref Range Status   Specimen Description BLOOD LEFT HAND  Final   Special Requests   Final    BOTTLES DRAWN AEROBIC ONLY Blood Culture results may not be optimal due to an excessive volume of blood received in culture bottles   Culture   Final    NO GROWTH 4 DAYS Performed at [facility], [address] [place], [place] [address]    Report Status PENDING  Incomplete   Time coordinating discharge: 35 minutes  SIGNED:  Merlene Laughtermair Latif Joy Reiger, DO [facility] 10/25/2017, 12:46 PM Pager [phone]  If 7PM-7AM, please contact night-coverage [email or web address] Password [email or web address]

## 2017-10-25 NOTE — Consult Note (Signed)
[facility] CM Primary Care Navigator  10/25/2017  MATILDE POTTENGER 01/20/1986 [id number]   Met withpatientand aunt Otila Kluver) at the bedsideto identify possible discharge needs. Patientreports that he was having "fever, redness and tenderness to left lower extremity"that had led to this admission.(Sepsis secondary to left leg cellulitis) . PatientendorsesDr.Nimish [facility] with [facility] as herprimary care provider.   [facility] [place] [facility].  Patient reportsmanagingher ownmedications at home, straight out of the containers.   Patientstatesthatshe has beendrivingprior to admission butaunt will be able to provide transportationtoher doctors' appointments if needed after discharge.   Patient states that she has been independent with self care prior to admission but roommate Raquel Sarna) will be assisting her if needed at home.   Anticipated discharge plan ishomeaccording topatient.  Patientvoiced understanding to call primary care provider'soffice whenshereturns home for a post discharge follow-up appointment within1- 2 weeksor sooner if needs arise.Patient letter (with PCP's contact number) was provided asareminder.  Explained topatientregardingTHN CM services available for health management/ resourcesat home but shedeniesany needs or concerns at this time anddeclinedTHN CMservices offered thatincludesEMMIcalls to follow-up withher recovery.   Patienthowever, expressed understanding to seekreferral to [facility] care managementfrom primary care providerifdeemed necessary and appropriate for servicesin thefuture.   [facility] care management information provided for future needs that may arise.   For additional questions please contact:  Edwena Felty A. Jaela Yepez, BSN, RN-BC [facility] PRIMARY CARE Navigator Cell: [phone]

## 2017-10-25 NOTE — Progress Notes (Signed)
T/O Give Keflex, monitor for 1 hours then OK to dischaarge.  Discharge instructions and prescription given.

## 2017-10-25 NOTE — Care Management Note (Signed)
Case Management Note  Patient Details  Name: [name] MRN: [id number] Date of Birth: 1985-09-17  Subjective/Objective:            Met with patient in room during [place] assessment w Melody RN [place]. They discussed her outpatient OT/ lymphedema clinic at [facility]). Melody stated she would assist with referral through PCP. Clinic will assist patient with necessary paperwork to fix/ replace current lymphedema pump. No further CM needs.   Action/Plan:   Expected Discharge Date:                  Expected Discharge Plan:  Home/Self Care  In-House Referral:     Discharge planning Services  CM Consult  Post Acute Care Choice:    Choice offered to:     DME Arranged:    DME Agency:     HH Arranged:    HH Agency:     Status of Service:  Completed, signed off  If discussed at [employer] of Stay Meetings, dates discussed:    Additional Comments:  Carles Collet, RN 10/25/2017, 10:59 AM

## 2017-10-25 NOTE — Progress Notes (Signed)
Pt did not present any signs and symptoms of allergic reaction to po keflex.  Respirations even and unlabored, denied shortness of breath.  No complaints of itching or hives.  Pleasant.

## 2017-10-25 NOTE — Progress Notes (Signed)
Pt left floor via wheelchair accompanied by staff and family. 

## 2017-10-25 NOTE — Progress Notes (Signed)
Paged Dr. Lisabeth RegisterSheik, pt was given prescription for Keflex,  Pt stated lips and face swells almost immediatley with Amoxicillan.  Paged to see if Dr. Lisabeth RegisterSheik wants to proceed with Keflex.

## 2017-10-26 LAB — CULTURE, BLOOD (ROUTINE X 2)
CULTURE: NO GROWTH
Culture: NO GROWTH

## 2017-10-30 ENCOUNTER — Ambulatory Visit: Payer: Medicare Other | Admitting: Adult Health

## 2017-10-30 ENCOUNTER — Telehealth: Payer: Self-pay

## 2017-10-30 NOTE — Telephone Encounter (Signed)
Left message requesting call back. Need to know who patient's pcp is.

## 2017-10-30 NOTE — Telephone Encounter (Signed)
Called patient to to ask if she had established care with Dr.Gosrani as her PCP. Recently hospitalized and Dr.Gosrani listed as her PCP.  No answer. Left message requesting call back.

## 2017-10-31 NOTE — Telephone Encounter (Signed)
Called patient to verify PCP. Left message requesting call back. 3rd attempt to reach patient.

## 2017-10-31 NOTE — Telephone Encounter (Signed)
noted 

## 2017-11-02 DIAGNOSIS — I890 Lymphedema, not elsewhere classified: Secondary | ICD-10-CM | POA: Diagnosis not present

## 2017-11-06 DIAGNOSIS — I890 Lymphedema, not elsewhere classified: Secondary | ICD-10-CM | POA: Diagnosis not present

## 2017-11-08 DIAGNOSIS — I890 Lymphedema, not elsewhere classified: Secondary | ICD-10-CM | POA: Diagnosis not present

## 2017-11-09 DIAGNOSIS — I890 Lymphedema, not elsewhere classified: Secondary | ICD-10-CM | POA: Diagnosis not present

## 2017-11-13 DIAGNOSIS — I890 Lymphedema, not elsewhere classified: Secondary | ICD-10-CM | POA: Diagnosis not present

## 2017-11-15 DIAGNOSIS — I890 Lymphedema, not elsewhere classified: Secondary | ICD-10-CM | POA: Diagnosis not present

## 2017-11-27 DIAGNOSIS — I890 Lymphedema, not elsewhere classified: Secondary | ICD-10-CM | POA: Diagnosis not present

## 2017-11-29 DIAGNOSIS — I890 Lymphedema, not elsewhere classified: Secondary | ICD-10-CM | POA: Diagnosis not present

## 2017-12-06 DIAGNOSIS — I890 Lymphedema, not elsewhere classified: Secondary | ICD-10-CM | POA: Diagnosis not present

## 2017-12-07 DIAGNOSIS — I890 Lymphedema, not elsewhere classified: Secondary | ICD-10-CM | POA: Diagnosis not present

## 2017-12-12 DIAGNOSIS — H52223 Regular astigmatism, bilateral: Secondary | ICD-10-CM | POA: Diagnosis not present

## 2017-12-12 DIAGNOSIS — H43393 Other vitreous opacities, bilateral: Secondary | ICD-10-CM | POA: Diagnosis not present

## 2017-12-12 DIAGNOSIS — H5213 Myopia, bilateral: Secondary | ICD-10-CM | POA: Diagnosis not present

## 2017-12-13 DIAGNOSIS — I890 Lymphedema, not elsewhere classified: Secondary | ICD-10-CM | POA: Diagnosis not present

## 2017-12-14 DIAGNOSIS — I890 Lymphedema, not elsewhere classified: Secondary | ICD-10-CM | POA: Diagnosis not present

## 2017-12-20 DIAGNOSIS — I890 Lymphedema, not elsewhere classified: Secondary | ICD-10-CM | POA: Diagnosis not present

## 2017-12-28 DIAGNOSIS — I890 Lymphedema, not elsewhere classified: Secondary | ICD-10-CM | POA: Diagnosis not present

## 2018-01-04 DIAGNOSIS — I890 Lymphedema, not elsewhere classified: Secondary | ICD-10-CM | POA: Diagnosis not present

## 2018-01-08 DIAGNOSIS — I890 Lymphedema, not elsewhere classified: Secondary | ICD-10-CM | POA: Diagnosis not present

## 2018-01-11 DIAGNOSIS — I890 Lymphedema, not elsewhere classified: Secondary | ICD-10-CM | POA: Diagnosis not present

## 2018-01-19 DIAGNOSIS — I890 Lymphedema, not elsewhere classified: Secondary | ICD-10-CM | POA: Diagnosis not present

## 2018-01-23 DIAGNOSIS — I10 Essential (primary) hypertension: Secondary | ICD-10-CM | POA: Diagnosis not present

## 2018-01-23 DIAGNOSIS — E559 Vitamin D deficiency, unspecified: Secondary | ICD-10-CM | POA: Diagnosis not present

## 2018-01-23 DIAGNOSIS — I890 Lymphedema, not elsewhere classified: Secondary | ICD-10-CM | POA: Diagnosis not present

## 2018-01-23 DIAGNOSIS — E282 Polycystic ovarian syndrome: Secondary | ICD-10-CM | POA: Diagnosis not present

## 2018-01-26 DIAGNOSIS — I890 Lymphedema, not elsewhere classified: Secondary | ICD-10-CM | POA: Diagnosis not present

## 2018-01-30 DIAGNOSIS — I890 Lymphedema, not elsewhere classified: Secondary | ICD-10-CM | POA: Diagnosis not present

## 2018-02-01 DIAGNOSIS — I890 Lymphedema, not elsewhere classified: Secondary | ICD-10-CM | POA: Diagnosis not present

## 2018-02-08 DIAGNOSIS — I890 Lymphedema, not elsewhere classified: Secondary | ICD-10-CM | POA: Diagnosis not present

## 2018-02-09 DIAGNOSIS — I890 Lymphedema, not elsewhere classified: Secondary | ICD-10-CM | POA: Diagnosis not present

## 2018-02-14 DIAGNOSIS — I890 Lymphedema, not elsewhere classified: Secondary | ICD-10-CM | POA: Diagnosis not present

## 2018-02-15 DIAGNOSIS — I890 Lymphedema, not elsewhere classified: Secondary | ICD-10-CM | POA: Diagnosis not present

## 2018-02-20 ENCOUNTER — Ambulatory Visit: Payer: Self-pay

## 2018-02-22 DIAGNOSIS — I890 Lymphedema, not elsewhere classified: Secondary | ICD-10-CM | POA: Diagnosis not present

## 2018-02-27 DIAGNOSIS — I890 Lymphedema, not elsewhere classified: Secondary | ICD-10-CM | POA: Diagnosis not present

## 2018-03-01 DIAGNOSIS — I890 Lymphedema, not elsewhere classified: Secondary | ICD-10-CM | POA: Diagnosis not present

## 2018-03-08 DIAGNOSIS — I890 Lymphedema, not elsewhere classified: Secondary | ICD-10-CM | POA: Diagnosis not present

## 2018-03-09 DIAGNOSIS — I890 Lymphedema, not elsewhere classified: Secondary | ICD-10-CM | POA: Diagnosis not present

## 2018-03-15 ENCOUNTER — Ambulatory Visit: Payer: Self-pay

## 2018-03-29 DIAGNOSIS — I890 Lymphedema, not elsewhere classified: Secondary | ICD-10-CM | POA: Diagnosis not present

## 2018-04-05 ENCOUNTER — Encounter: Payer: Self-pay | Admitting: *Deleted

## 2018-05-29 ENCOUNTER — Encounter: Payer: Self-pay | Admitting: *Deleted

## 2018-06-05 DIAGNOSIS — H1032 Unspecified acute conjunctivitis, left eye: Secondary | ICD-10-CM | POA: Diagnosis not present

## 2018-06-05 DIAGNOSIS — H43393 Other vitreous opacities, bilateral: Secondary | ICD-10-CM | POA: Diagnosis not present

## 2018-06-06 DIAGNOSIS — H43393 Other vitreous opacities, bilateral: Secondary | ICD-10-CM | POA: Diagnosis not present

## 2018-06-06 DIAGNOSIS — H1032 Unspecified acute conjunctivitis, left eye: Secondary | ICD-10-CM | POA: Diagnosis not present

## 2018-06-06 DIAGNOSIS — H00035 Abscess of left lower eyelid: Secondary | ICD-10-CM | POA: Diagnosis not present

## 2018-06-06 DIAGNOSIS — H00034 Abscess of left upper eyelid: Secondary | ICD-10-CM | POA: Diagnosis not present

## 2018-08-28 ENCOUNTER — Ambulatory Visit (INDEPENDENT_AMBULATORY_CARE_PROVIDER_SITE_OTHER): Payer: Medicare Other | Admitting: Family Medicine

## 2018-08-28 ENCOUNTER — Other Ambulatory Visit: Payer: Self-pay

## 2018-08-28 ENCOUNTER — Encounter: Payer: Self-pay | Admitting: Family Medicine

## 2018-08-28 VITALS — Ht 69.0 in | Wt 320.0 lb

## 2018-08-28 DIAGNOSIS — Z0000 Encounter for general adult medical examination without abnormal findings: Secondary | ICD-10-CM | POA: Diagnosis not present

## 2018-08-28 NOTE — Patient Instructions (Signed)
[name] , Thank you for taking time to come for yourMedicare Wellness Visit. I appreciate your ongoing commitment to your health goals. Please review the following plan we discussed and let me know if I can assist you in the future.   Screening recommendations/referrals: Colonoscopy: Due at age 35 Mammogram: Due at age 36 Bone Density: Due at age 41 Recommended yearly ophthalmology/optometry visit for glaucoma screening and checkup Recommended yearly dental visit for hygiene and checkup  Vaccinations: Influenza vaccine: Due 12/2018 Pneumococcal vaccine: Due at age 67 Tdap vaccine: Up to date, due 01/2021 Shingles vaccine: Due at age 108    Conditions/risks identified: Obese, recommend starting a routine exercise program at least 3 days a week for 30-45 minutes at a time as tolerated.   Next appointment: Follow up with Dr. Moshe Cipro. Follow up in 1 year for your annual wellness visit.   Preventive Care 18-39 Years, Female  Preventive care refers to lifestyle choices and visits with your health care provider that can promote health and wellness. What does preventive care include?    A yearly physical exam. This is also called an annual well check.  Dental exams once or twice a year.  Routine eye exams. Ask your health care provider how often you should have your eyes checked.  Personal lifestyle choices, including: ? Daily care of your teeth and gums. ? Regular physical activity. ? Eating a healthy diet. ? Avoiding tobacco and drug use. ? Limiting alcohol use. ? Practicing safe sex. ? Taking vitamin and mineral supplements as recommended by your health care provider. What happens during an annual well check? The services and screenings done by your health care provider during your annual well check will depend on your age, overall health, lifestyle risk factors, and family history of disease. Counseling Your health care provider may ask you questions about your:   Alcohol use.  Tobacco use.  Drug use.  Emotional well-being.  Home and relationship well-being.  Sexual activity.  Eating habits.  Work and work Statistician.  Method of birth control.  Menstrual cycle.  Pregnancy history.  Screening You may have the following tests or measurements:  Height, weight, and BMI.  Diabetes screening. This is done by checking your blood sugar (glucose) after you have not eaten for a while (fasting).  Blood pressure.  Lipid and cholesterol levels. These may be checked every 5 years starting at age 95.  Skin check.  Hepatitis C blood test.  Hepatitis B blood test.  Sexually transmitted disease (STD) testing.  BRCA-related cancer screening. This may be done if you have a family history of breast, ovarian, tubal, or peritoneal cancers.  Pelvic exam and Pap test. This may be done every 3 years starting at age 55. Starting at age 62, this may be done every 5 years if you have a Pap test in combination with an HPV test.  Discuss your test results, treatment options, and if necessary, the need for more tests with your health care provider. Vaccines Your health care provider may recommend certain vaccines, such as:  Influenza vaccine. This is recommended every year.  Tetanus, diphtheria, and acellular pertussis (Tdap, Td) vaccine. You may need a Td booster every 10 years.  Varicella vaccine. You may need this if you have not been vaccinated.  HPV vaccine. If you are 12 or younger, you may need three doses over 6 months.  Measles, mumps, and rubella (MMR) vaccine. You may need at least one dose of MMR. You may also need  a second dose.  Pneumococcal 13-valent conjugate (PCV13) vaccine. You may need this if you have certain conditions and were not previously vaccinated.  Pneumococcal polysaccharide (PPSV23) vaccine. You may need one or two doses if you smoke cigarettes or if you have certain conditions.  Meningococcal vaccine. One dose  is recommended if you are age 67-21 years and a first-year college student living in a residence hall, or if you have one of several medical conditions. You may also need additional booster doses.  Hepatitis A vaccine. You may need this if you have certain conditions or if you travel or work in places where you may be exposed to hepatitis A.  Hepatitis B vaccine. You may need this if you have certain conditions or if you travel or work in places where you may be exposed to hepatitis B.  Haemophilus influenzae type b (Hib) vaccine. You may need this if you have certain risk factors.  Talk to your health care provider about which screenings and vaccines you need and how often you need them. This information is not intended to replace advice given to you by your health care provider. Make sure you discuss any questions you have with your health care provider. Document Released: 06/07/2001 Document Revised: 12/30/2015 Document Reviewed: 02/10/2015 Elsevier Interactive Patient Education  2017 [employer].
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
## 2018-09-18 ENCOUNTER — Encounter: Payer: Self-pay | Admitting: Family Medicine

## 2018-09-18 ENCOUNTER — Other Ambulatory Visit: Payer: Self-pay

## 2018-09-18 ENCOUNTER — Ambulatory Visit (INDEPENDENT_AMBULATORY_CARE_PROVIDER_SITE_OTHER): Payer: Medicare Other | Admitting: Family Medicine

## 2018-09-18 DIAGNOSIS — E785 Hyperlipidemia, unspecified: Secondary | ICD-10-CM

## 2018-09-18 DIAGNOSIS — R7303 Prediabetes: Secondary | ICD-10-CM | POA: Diagnosis not present
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
## 2018-09-18 NOTE — Patient Instructions (Addendum)
    Thank you for completing your visit via telephone I appreciate the opportunity to provide you with the care for your health and wellness. Today we discussed: Overall health and wellness  I have placed some labs for you to get a week prior to your next appointment.  Please get these so that we can have them to review at your next appointment.  Which will be in about 4 months.  Please continue your weight loss journey congratulations on losing 30 pounds over the last year.  This is no easy fee and it is a really great process that you have implemented for yourself for the long run.  Me and Dr. Lodema Hong are both very proud of you and wish you good luck and success in continuing this.  Please let us know if there is anything that we can do for you in the interim.  We are here for you and if you need [place] before your next appointment just reach out.  Please continue to practice social distancing during this time to keep you, your family and our community safe.  WASH YOUR HANDS WELL AND FREQUENTLY. AVOID TOUCHING YOUR FACE, UNLESS YOUR HANDS ARE FRESHLY WASHED.  GET FRESH AIR DAILY. STAY HYDRATED WITH WATER.   It was a pleasure to see you and I look forward to continuing to work together on your health and well-being. Please do not hesitate to call the office if you need care or have questions about your care.  Have a wonderful day and week. With Gratitude, Tereasa Coop, DNP, AGNP-BC

## 2018-09-20 ENCOUNTER — Encounter: Payer: Self-pay | Admitting: Family Medicine

## 2019-01-02 ENCOUNTER — Other Ambulatory Visit: Payer: Self-pay

## 2019-01-02 ENCOUNTER — Ambulatory Visit (INDEPENDENT_AMBULATORY_CARE_PROVIDER_SITE_OTHER): Payer: Medicare Other | Admitting: Obstetrics and Gynecology

## 2019-01-02 ENCOUNTER — Encounter: Payer: Self-pay | Admitting: Obstetrics and Gynecology

## 2019-01-02 ENCOUNTER — Other Ambulatory Visit (HOSPITAL_COMMUNITY)
Admission: RE | Admit: 2019-01-02 | Discharge: 2019-01-02 | Disposition: A | Discharge status: Home / Self Care | Payer: Medicare Other | Source: Ambulatory Visit | Attending: Obstetrics and Gynecology | Admitting: Obstetrics and Gynecology

## 2019-01-02 VITALS — BP 155/92 | HR 104 | Ht 70.0 in | Wt 348.8 lb

## 2019-01-02 DIAGNOSIS — Z1151 Encounter for screening for human papillomavirus (HPV): Secondary | ICD-10-CM | POA: Diagnosis not present

## 2019-01-02 DIAGNOSIS — N926 Irregular menstruation, unspecified: Secondary | ICD-10-CM | POA: Insufficient documentation

## 2019-01-02 DIAGNOSIS — Z124 Encounter for screening for malignant neoplasm of cervix: Secondary | ICD-10-CM | POA: Insufficient documentation

## 2019-01-02 DIAGNOSIS — E282 Polycystic ovarian syndrome: Secondary | ICD-10-CM | POA: Diagnosis not present
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
## 2019-01-03 LAB — TESTOSTERONE, FREE, TOTAL, SHBG
Sex Hormone Binding: 178 nmol/L — ABNORMAL HIGH (ref 24.6–122.0)
Testosterone, Free: 3.3 pg/mL (ref 0.0–4.2)
Testosterone: 167 ng/dL — ABNORMAL HIGH (ref 8–48)

## 2019-01-04 ENCOUNTER — Telehealth: Payer: Self-pay | Admitting: Obstetrics and Gynecology

## 2019-01-04 LAB — CYTOLOGY - PAP
Adequacy: ABSENT
Chlamydia: NEGATIVE
Diagnosis: NEGATIVE
HPV: NOT DETECTED
Neisseria Gonorrhea: NEGATIVE

## 2019-01-04 MED ORDER — DROSPIRENONE-ETHINYL ESTRADIOL 3-0.03 MG PO TABS: 1.0000 | ORAL_TABLET | Freq: Every day | ORAL | 11 refills | Status: DC

## 2019-01-04 MED ORDER — SPIRONOLACTONE 100 MG PO TABS: 100.0000 mg | ORAL_TABLET | Freq: Every day | ORAL | 3 refills | Status: DC

## 2019-01-04 NOTE — Telephone Encounter (Signed)
Elevated serum testosterone and sex hormone binding globulin, will treat with drospirenone containing OCP and spironolactone 100 mg p.o. daily.  Message left for patient to review my chart and be made aware of the prescriptions called in

## 2019-01-08 ENCOUNTER — Other Ambulatory Visit: Payer: Self-pay | Admitting: Obstetrics and Gynecology

## 2019-01-08 MED ORDER — FLUCONAZOLE 150 MG PO TABS: 150.0000 mg | ORAL_TABLET | Freq: Once | ORAL | 1 refills | Status: AC

## 2019-01-08 NOTE — Progress Notes (Signed)
Diflucan e.scribed .

## 2019-01-15 ENCOUNTER — Other Ambulatory Visit: Payer: Self-pay | Admitting: Obstetrics and Gynecology

## 2019-01-15 DIAGNOSIS — E282 Polycystic ovarian syndrome: Secondary | ICD-10-CM

## 2019-01-16 ENCOUNTER — Ambulatory Visit: Payer: Medicare Other | Admitting: Obstetrics and Gynecology

## 2019-01-16 ENCOUNTER — Other Ambulatory Visit: Payer: Medicare Other

## 2019-01-22 ENCOUNTER — Ambulatory Visit: Payer: Medicare Other | Admitting: Family Medicine

## 2019-04-26 NOTE — L&D Delivery Note (Signed)
OB/[facility]N Faculty Practice Delivery Note  [name] is a 34 y.o. G2P1011 s/p NSVD at [redacted]w[redacted]d. She was admitted for IOL for GDMA2.   ROM: 17h 65m with clear fluid GBS Status: NEGATIVE/-- (04/11 0823) Maximum Maternal Temperature: 99.8 F    Labor Progress: . Patient arrived at 0 cm dilation. Initially ripened with Cytotec x5 and foley bulb. During initial stage of IOL had elevated BP's and labs consistent with gestational HTN. Subsequently induced further with pitocin and AROM and progressed to uncomplicated NSVD, total IOL time ~48 hours.  Delivery Date/Time: 08/06/2019 at 11:32 Delivery: Called to room and patient was complete and pushing. Head delivered in ROA position. Loose nuchal x1 reduced. Shoulder and body delivered in usual fashion. Infant with spontaneous cry, placed on mother's abdomen, dried and stimulated. Cord clamped x 2 after 1-minute delay, and cut by Dr. Rachael Darby. Cord blood drawn. Placenta delivered spontaneously with gentle cord traction. Fundus firm with massage and Pitocin. Labia, perineum, vagina, and cervix inspected with hemostatic R labial and hemostatic 1st degree perineal, neither repaired.   Placenta: 3v intact to L&D Complications: none Lacerations: hemostatic R labial and hemostatic 1st degree perineal, neither repaired EBL: 100 cc Analgesia: epidural   Infant: APGAR (1 MIN): 9   APGAR (5 MINS): 9    Weight: 2756 grams  Zack Seal, MD/[facility] OB/GYN Fellow, Faculty Practice

## 2019-05-29 ENCOUNTER — Other Ambulatory Visit: Payer: Self-pay

## 2019-05-29 ENCOUNTER — Ambulatory Visit (INDEPENDENT_AMBULATORY_CARE_PROVIDER_SITE_OTHER): Payer: Medicare Other | Admitting: *Deleted

## 2019-05-29 ENCOUNTER — Encounter: Payer: Self-pay | Admitting: *Deleted

## 2019-05-29 VITALS — BP 133/82 | HR 107 | Ht 69.0 in | Wt 341.5 lb

## 2019-05-29 DIAGNOSIS — Z3201 Encounter for pregnancy test, result positive: Secondary | ICD-10-CM | POA: Diagnosis not present

## 2019-05-29 DIAGNOSIS — Z3687 Encounter for antenatal screening for uncertain dates: Secondary | ICD-10-CM | POA: Diagnosis not present

## 2019-05-29 LAB — POCT URINE PREGNANCY: Preg Test, Ur: POSITIVE — AB

## 2019-05-29 MED ORDER — PRENATAL PLUS 27-1 MG PO TABS: 1.0000 | ORAL_TABLET | Freq: Every day | ORAL | 12 refills | Status: DC

## 2019-05-29 NOTE — Progress Notes (Signed)
   NURSE VISIT- PREGNANCY CONFIRMATION   SUBJECTIVE:  [name] is a 34 y.o. [name] female at Unknown by uncertain LMP of No LMP recorded. Patient is pregnant. Here for pregnancy confirmation.  Home pregnancy test: positive x 1  She reports cramping.  She is not taking prenatal vitamins.    OBJECTIVE:  BP 133/82 (BP Location: Left Arm, Patient Position: Sitting, Cuff Size: Large)   Pulse (!) 107   Ht 5\' 9"  (1.753 m)   Wt (!) 341 lb 8 oz (154.9 kg)   BMI 50.43 kg/m   Appears well, in no apparent distress OB History  Gravida Para Term Preterm AB Living  2       1 0  SAB TAB Ectopic Multiple Live Births  1            # Outcome Date GA Lbr Len/2nd Weight Sex Delivery Anes PTL Lv  2 Current           1 SAB             Results for orders placed or performed in visit on 05/29/19 (from the past 24 hour(s))  POCT urine pregnancy   Collection Time: 05/29/19 10:15 AM  Result Value Ref Range   Preg Test, Ur Positive (A) Negative    ASSESSMENT: Positive pregnancy test, Unknown by LMP    PLAN: Schedule for dating ultrasound once we get results back from quant and progesterone labs.  Prenatal vitamins: note routed to [facility] to send prescription   Nausea medicines: not currently needed   OB packet given: Yes  07/27/19  05/29/2019 10:29 AM

## 2019-05-29 NOTE — Progress Notes (Signed)
Chart reviewed for nurse visit. Agree with plan of care. Rx prenatal Plus Adline Potter, NP 05/29/2019 10:29 AM

## 2019-05-30 LAB — PROGESTERONE: Progesterone: 45.4 ng/mL

## 2019-05-30 LAB — BETA HCG QUANT (REF LAB): hCG Quant: 3558 m[IU]/mL

## 2019-06-10 ENCOUNTER — Other Ambulatory Visit: Payer: Self-pay | Admitting: Obstetrics and Gynecology

## 2019-06-10 DIAGNOSIS — O3680X0 Pregnancy with inconclusive fetal viability, not applicable or unspecified: Secondary | ICD-10-CM

## 2019-06-11 ENCOUNTER — Other Ambulatory Visit: Payer: Self-pay | Admitting: Obstetrics and Gynecology

## 2019-06-11 ENCOUNTER — Other Ambulatory Visit: Payer: Self-pay

## 2019-06-11 ENCOUNTER — Ambulatory Visit (INDEPENDENT_AMBULATORY_CARE_PROVIDER_SITE_OTHER): Payer: Medicare Other

## 2019-06-11 DIAGNOSIS — Z363 Encounter for antenatal screening for malformations: Secondary | ICD-10-CM

## 2019-06-11 DIAGNOSIS — O3680X0 Pregnancy with inconclusive fetal viability, not applicable or unspecified: Secondary | ICD-10-CM

## 2019-06-11 DIAGNOSIS — Z3A31 31 weeks gestation of pregnancy: Secondary | ICD-10-CM

## 2019-06-11 DIAGNOSIS — O24410 Gestational diabetes mellitus in pregnancy, diet controlled: Secondary | ICD-10-CM

## 2019-06-11 NOTE — Progress Notes (Signed)
[place] 31+2 wks,cephalic,posterior fundal placenta gr 3,normal ovaries,afi 11 cm,fhr 146 bpm,efw 1704 g,anatomy complete,limited view because of pt body habitus and fetal age,no obvious abnormalities,EDD 08/11/2019 by today's ultrasound

## 2019-06-12 ENCOUNTER — Other Ambulatory Visit: Payer: Self-pay

## 2019-06-12 ENCOUNTER — Ambulatory Visit: Payer: Medicare Other | Admitting: *Deleted

## 2019-06-12 ENCOUNTER — Ambulatory Visit (INDEPENDENT_AMBULATORY_CARE_PROVIDER_SITE_OTHER): Payer: Medicare Other | Admitting: Advanced Practice Midwife

## 2019-06-12 ENCOUNTER — Other Ambulatory Visit: Payer: Medicare Other

## 2019-06-12 ENCOUNTER — Encounter: Payer: Self-pay | Admitting: Advanced Practice Midwife

## 2019-06-12 VITALS — BP 112/79 | HR 114

## 2019-06-12 DIAGNOSIS — O99310 Alcohol use complicating pregnancy, unspecified trimester: Secondary | ICD-10-CM | POA: Insufficient documentation

## 2019-06-12 DIAGNOSIS — Z1389 Encounter for screening for other disorder: Secondary | ICD-10-CM

## 2019-06-12 DIAGNOSIS — Z3A31 31 weeks gestation of pregnancy: Secondary | ICD-10-CM

## 2019-06-12 DIAGNOSIS — Z131 Encounter for screening for diabetes mellitus: Secondary | ICD-10-CM | POA: Diagnosis not present

## 2019-06-12 DIAGNOSIS — Z113 Encounter for screening for infections with a predominantly sexual mode of transmission: Secondary | ICD-10-CM

## 2019-06-12 DIAGNOSIS — O99313 Alcohol use complicating pregnancy, third trimester: Secondary | ICD-10-CM

## 2019-06-12 DIAGNOSIS — Z0283 Encounter for blood-alcohol and blood-drug test: Secondary | ICD-10-CM | POA: Diagnosis not present

## 2019-06-12 DIAGNOSIS — Z331 Pregnant state, incidental: Secondary | ICD-10-CM | POA: Diagnosis not present

## 2019-06-12 DIAGNOSIS — Z3483 Encounter for supervision of other normal pregnancy, third trimester: Secondary | ICD-10-CM

## 2019-06-12 DIAGNOSIS — O0990 Supervision of high risk pregnancy, unspecified, unspecified trimester: Secondary | ICD-10-CM | POA: Insufficient documentation

## 2019-06-12 DIAGNOSIS — Z0389 Encounter for observation for other suspected diseases and conditions ruled out: Secondary | ICD-10-CM | POA: Diagnosis not present

## 2019-06-12 MED ORDER — BLOOD PRESSURE MONITOR MISC: 0 refills | Status: DC

## 2019-06-12 NOTE — Patient Instructions (Signed)
[name], I greatly value your feedback.  If you receive a survey following your visit with [place] today, we appreciate you taking the time to fill it out.  Thanks, Philipp Deputy, CNM  [facility] HAS MOVED!!! It is now [facility] ([address] [place], [place] [address]) Entrance located off of [address] Free 24/7 valet parking   Nausea & Vomiting  Have saltine crackers or pretzels by your bed and eat a few bites before you raise your head out of bed in the morning  Eat small frequent meals throughout the day instead of large meals  Drink plenty of fluids throughout the day to stay hydrated, just don't drink a lot of fluids with your meals.  This can make your stomach fill up faster making you feel sick  Do not brush your teeth right after you eat  Products with real ginger are good for nausea, like ginger ale and ginger hard candy Make sure it says made with real ginger!  Sucking on sour candy like lemon heads is also good for nausea  If your prenatal vitamins make you nauseated, take them at night so you will sleep through the nausea  Sea Bands  If you feel like you need medicine for the nausea & vomiting please let us know  If you are unable to keep any fluids or food down please let us know   Constipation  Drink plenty of fluid, preferably water, throughout the day  Eat foods high in fiber such as fruits, vegetables, and grains  Exercise, such as walking, is a good way to keep your bowels regular  Drink warm fluids, especially warm prune juice, or decaf coffee  Eat a 1/2 cup of real oatmeal (not instant), 1/2 cup applesauce, and 1/2-1 cup warm prune juice every day  If needed, you may take Colace (docusate sodium) stool softener once or twice a day to help keep the stool soft.   If you still are having problems with constipation, you may take Miralax once daily as needed to help keep your bowels regular.   Home Blood  Pressure Monitoring for Patients   Your provider has recommended that you check your blood pressure (BP) at least once a week at home. If you do not have a blood pressure cuff at home, one will be provided for you. Contact your provider if you have not received your monitor within 1 week.   Helpful Tips for Accurate Home Blood Pressure Checks  . Don't smoke, exercise, or drink caffeine 30 minutes before checking your BP . Use the restroom before checking your BP (a full bladder can raise your pressure) . Relax in a comfortable upright chair . Feet on the ground . Left arm resting comfortably on a flat surface at the level of your heart . Legs uncrossed . Back supported . Sit quietly and don't talk . Place the cuff on your bare arm . Adjust snuggly, so that only two fingertips can fit between your skin and the top of the cuff . Check 2 readings separated by at least one minute . Keep a log of your BP readings . For a visual, please reference this diagram: http://ccnc.care/bpdiagram  Provider Name: [facility]     Phone: [phone]  Zone 1: ALL CLEAR  Continue to monitor your symptoms:  . BP reading is less than 140 (top number) or less than 90 (bottom number)  . No right upper stomach pain .  No headaches or seeing spots . No feeling nauseated or throwing up . No swelling in face and hands  Zone 2: CAUTION Call your doctor's office for any of the following:  . BP reading is greater than 140 (top number) or greater than 90 (bottom number)  . Stomach pain under your ribs in the middle or right side . Headaches or seeing spots . Feeling nauseated or throwing up . Swelling in face and hands  Zone 3: EMERGENCY  Seek immediate medical care if you have any of the following:  . BP reading is greater than160 (top number) or greater than 110 (bottom number) . Severe headaches not improving with Tylenol . Serious difficulty catching your breath . Any worsening symptoms from Zone  2    First Trimester of Pregnancy The first trimester of pregnancy is from week 1 until the end of week 12 (months 1 through 3). A week after a sperm fertilizes an egg, the egg will implant on the wall of the uterus. This embryo will begin to develop into a baby. Genes from you and your partner are forming the baby. The female genes determine whether the baby is a boy or a girl. At 6-8 weeks, the eyes and face are formed, and the heartbeat can be seen on ultrasound. At the end of 12 weeks, all the baby's organs are formed.  Now that you are pregnant, you will want to do everything you can to have a healthy baby. Two of the most important things are to get good prenatal care and to follow your health care provider's instructions. Prenatal care is all the medical care you receive before the baby's birth. This care will help prevent, find, and treat any problems during the pregnancy and childbirth. BODY CHANGES Your body goes through many changes during pregnancy. The changes vary from woman to woman.   You may gain or lose a couple of pounds at first.  You may feel sick to your stomach (nauseous) and throw up (vomit). If the vomiting is uncontrollable, call your health care provider.  You may tire easily.  You may develop headaches that can be relieved by medicines approved by your health care provider.  You may urinate more often. Painful urination may mean you have a bladder infection.  You may develop heartburn as a result of your pregnancy.  You may develop constipation because certain hormones are causing the muscles that push waste through your intestines to slow down.  You may develop hemorrhoids or swollen, bulging veins (varicose veins).  Your breasts may begin to grow larger and become tender. Your nipples may stick out more, and the tissue that surrounds them (areola) may become darker.  Your gums may bleed and may be sensitive to brushing and flossing.  Dark spots or blotches  (chloasma, mask of pregnancy) may develop on your face. This will likely fade after the baby is born.  Your menstrual periods will stop.  You may have a loss of appetite.  You may develop cravings for certain kinds of food.  You may have changes in your emotions from day to day, such as being excited to be pregnant or being concerned that something may go wrong with the pregnancy and baby.  You may have more vivid and strange dreams.  You may have changes in your hair. These can include thickening of your hair, rapid growth, and changes in texture. Some women also have hair loss during or after pregnancy, or hair that feels dry  or thin. Your hair will most likely return to normal after your baby is born. WHAT TO EXPECT AT YOUR PRENATAL VISITS During a routine prenatal visit:  You will be weighed to make sure you and the baby are growing normally.  Your blood pressure will be taken.  Your abdomen will be measured to track your baby's growth.  The fetal heartbeat will be listened to starting around week 10 or 12 of your pregnancy.  Test results from any previous visits will be discussed. Your health care provider may ask you:  How you are feeling.  If you are feeling the baby move.  If you have had any abnormal symptoms, such as leaking fluid, bleeding, severe headaches, or abdominal cramping.  If you have any questions. Other tests that may be performed during your first trimester include:  Blood tests to find your blood type and to check for the presence of any previous infections. They will also be used to check for low iron levels (anemia) and Rh antibodies. Later in the pregnancy, blood tests for diabetes will be done along with other tests if problems develop.  Urine tests to check for infections, diabetes, or protein in the urine.  An ultrasound to confirm the proper growth and development of the baby.  An amniocentesis to check for possible genetic problems.  Fetal  screens for spina bifida and Down syndrome.  You may need other tests to make sure you and the baby are doing well. HOME CARE INSTRUCTIONS  Medicines  Follow your health care provider's instructions regarding medicine use. Specific medicines may be either safe or unsafe to take during pregnancy.  Take your prenatal vitamins as directed.  If you develop constipation, try taking a stool softener if your health care provider approves. Diet  Eat regular, well-balanced meals. Choose a variety of foods, such as meat or vegetable-based protein, fish, milk and low-fat dairy products, vegetables, fruits, and whole grain breads and cereals. Your health care provider will help you determine the amount of weight gain that is right for you.  Avoid raw meat and uncooked cheese. These carry germs that can cause birth defects in the baby.  Eating four or five small meals rather than three large meals a day may help relieve nausea and vomiting. If you start to feel nauseous, eating a few soda crackers can be helpful. Drinking liquids between meals instead of during meals also seems to help nausea and vomiting.  If you develop constipation, eat more high-fiber foods, such as fresh vegetables or fruit and whole grains. Drink enough fluids to keep your urine clear or pale yellow. Activity and Exercise  Exercise only as directed by your health care provider. Exercising will help you:  Control your weight.  Stay in shape.  Be prepared for labor and delivery.  Experiencing pain or cramping in the lower abdomen or low back is a good sign that you should stop exercising. Check with your health care provider before continuing normal exercises.  Try to avoid standing for long periods of time. Move your legs often if you must stand in one place for a long time.  Avoid heavy lifting.  Wear low-heeled shoes, and practice good posture.  You may continue to have sex unless your health care provider directs you  otherwise. Relief of Pain or Discomfort  Wear a good support bra for breast tenderness.    Take warm sitz baths to soothe any pain or discomfort caused by hemorrhoids. Use hemorrhoid cream if your  health care provider approves.    Rest with your legs elevated if you have leg cramps or low back pain.  If you develop varicose veins in your legs, wear support hose. Elevate your feet for 15 minutes, 3-4 times a day. Limit salt in your diet. Prenatal Care  Schedule your prenatal visits by the twelfth week of pregnancy. They are usually scheduled monthly at first, then more often in the last 2 months before delivery.  Write down your questions. Take them to your prenatal visits.  Keep all your prenatal visits as directed by your health care provider. Safety  Wear your seat belt at all times when driving.  Make a list of emergency phone numbers, including numbers for family, friends, the hospital, and police and fire departments. General Tips  Ask your health care provider for a referral to a local prenatal education class. Begin classes no later than at the beginning of month 6 of your pregnancy.  Ask for help if you have counseling or nutritional needs during pregnancy. Your health care provider can offer advice or refer you to specialists for help with various needs.  Do not use hot tubs, steam rooms, or saunas.  Do not douche or use tampons or scented sanitary pads.  Do not cross your legs for long periods of time.  Avoid cat litter boxes and soil used by cats. These carry germs that can cause birth defects in the baby and possibly loss of the fetus by miscarriage or stillbirth.  Avoid all smoking, herbs, alcohol, and medicines not prescribed by your health care provider. Chemicals in these affect the formation and growth of the baby.  Schedule a dentist appointment. At home, brush your teeth with a soft toothbrush and be gentle when you floss. SEEK MEDICAL CARE IF:   You have  dizziness.  You have mild pelvic cramps, pelvic pressure, or nagging pain in the abdominal area.  You have persistent nausea, vomiting, or diarrhea.  You have a bad smelling vaginal discharge.  You have pain with urination.  You notice increased swelling in your face, hands, legs, or ankles. SEEK IMMEDIATE MEDICAL CARE IF:   You have a fever.  You are leaking fluid from your vagina.  You have spotting or bleeding from your vagina.  You have severe abdominal cramping or pain.  You have rapid weight gain or loss.  You vomit blood or material that looks like coffee grounds.  You are exposed to [place] measles and have never had them.  You are exposed to fifth disease or chickenpox.  You develop a severe headache.  You have shortness of breath.  You have any kind of trauma, such as from a fall or a car accident. Document Released: 04/05/2001 Document Revised: 08/26/2013 Document Reviewed: 02/19/2013 [facility] Patient Information 2015 [place], [place]. This information is not intended to replace advice given to you by your health care provider. Make sure you discuss any questions you have with your health care provider.  Coronavirus (COVID-19) Are you at risk?  Are you at risk for the Coronavirus (COVID-19)?  To be considered HIGH RISK for Coronavirus (COVID-19), you have to meet the following criteria:  . Traveled to [place], [place], [place], [place] or [place]; or in the [place] to [place], [place], [place], or [place]; and have fever, cough, and shortness of breath within the last 2 weeks of travel OR . Been in close contact with a person diagnosed with COVID-19 within the last 2 weeks and  have fever, cough, and shortness of breath . IF YOU DO NOT MEET THESE CRITERIA, YOU ARE CONSIDERED LOW RISK FOR COVID-19.  What to do if you are HIGH RISK for COVID-19?  Marland Kitchen If you are having a medical emergency, call 911. . Seek medical care right away. Before you go to a  doctor's office, urgent care or emergency department, call ahead and tell them about your recent travel, contact with someone diagnosed with COVID-19, and your symptoms. You should receive instructions from your physician's office regarding next steps of care.  . When you arrive at healthcare provider, tell the healthcare staff immediately you have returned from visiting [place], [place], [place], [place] or [place]; or traveled in the [place] to [place], [place], [place], or [place]; in the last two weeks or you have been in close contact with a person diagnosed with COVID-19 in the last 2 weeks.   . Tell the health care staff about your symptoms: fever, cough and shortness of breath. . After you have been seen by a medical provider, you will be either: o Tested for (COVID-19) and discharged home on quarantine except to seek medical care if symptoms worsen, and asked to  - Stay home and avoid contact with others until you get your results (4-5 days)  - Avoid travel on public transportation if possible (such as bus, train, or airplane) or o Sent to the Emergency Department by EMS for evaluation, COVID-19 testing, and possible admission depending on your condition and test results.  What to do if you are LOW RISK for COVID-19?  Reduce your risk of any infection by using the same precautions used for avoiding the common cold or flu:  Marland Kitchen Wash your hands often with soap and warm water for at least 20 seconds.  If soap and water are not readily available, use an alcohol-based hand sanitizer with at least 60% alcohol.  . If coughing or sneezing, cover your mouth and nose by coughing or sneezing into the elbow areas of your shirt or coat, into a tissue or into your sleeve (not your hands). . Avoid shaking hands with others and consider head nods or verbal greetings only. . Avoid touching your eyes, nose, or mouth with unwashed hands.  . Avoid close contact with people who are sick. . Avoid  places or events with large numbers of people in one location, like concerts or sporting events. . Carefully consider travel plans you have or are making. . If you are planning any travel outside or inside the [place], visit the CDC's Travelers' Health webpage for the latest health notices. . If you have some symptoms but not all symptoms, continue to monitor at home and seek medical attention if your symptoms worsen. . If you are having a medical emergency, call 911.   [place] / e-Visit: [email or web address]         [facility]: [facility]: [id number]                   [facility]: [phone]

## 2019-06-12 NOTE — Progress Notes (Signed)
INITIAL OBSTETRICAL VISIT Patient name: [name] MRN [id number]  Date of birth: 1985-12-29 Chief Complaint:   Initial Prenatal Visit  History of Present Illness:   [name] is a 34 y.o. G72P0010 African American female at [redacted]w[redacted]d by 31wk scan with an Estimated Date of Delivery: 08/11/19 being seen today for her initial obstetrical visit.   Her obstetrical history is significant for not knowing she was pregnant until this month; concerned re some weekends with heavy drinking in the fall/January; hx PCOS.   Today she reports no complaints.  No LMP recorded (lmp unknown). Patient is pregnant. Last pap Sept 2020. Results were: normal Review of Systems:   Pertinent items are noted in HPI Denies cramping/contractions, leakage of fluid, vaginal bleeding, abnormal vaginal discharge w/ itching/odor/irritation, headaches, visual changes, shortness of breath, chest pain, abdominal pain, severe nausea/vomiting, or problems with urination or bowel movements unless otherwise stated above.  Pertinent History Reviewed:  Reviewed past medical,surgical, social, obstetrical and family history.  Reviewed problem list, medications and allergies. OB History  Gravida Para Term Preterm AB Living  2       1 0  SAB TAB Ectopic Multiple Live Births  1            # Outcome Date GA Lbr Len/2nd Weight Sex Delivery Anes PTL Lv  2 Current           1 SAB            Physical Assessment:   Vitals:   06/12/19 1010  BP: 112/79  Pulse: (!) 114  There is no height or weight on file to calculate BMI.       Physical Examination:  General appearance - well appearing, and in no distress  Mental status - alert, oriented to person, place, and time  Psych:  She has a normal mood and affect  Skin - warm and dry, normal color, no suspicious lesions noted  Chest - effort normal, all lung fields clear to auscultation bilaterally  Heart - normal rate and regular rhythm  Abdomen - soft,  nontender  Extremities:  No swelling or varicosities noted  Pelvic - not indicated  Thin prep pap is not done   Dating/anatomy scan 06/11/19: [place] 31+2 wks,cephalic,posterior fundal placenta gr 3,normal ovaries,afi 11 cm,fhr 146 bpm,efw 1704 g,anatomy complete,limited view because of pt body habitus and fetal age,no obvious abnormalities,EDD 08/11/2019 by today's ultrasound   No results found for this or any previous visit (from the past 24 hour(s)).  Assessment & Plan:  1) Low-Risk Pregnancy [name] at [redacted]w[redacted]d with an Estimated Date of Delivery: 08/11/19  2) Initial OB visit- late to care as she didn't realize she was pregnant  3) Depression, no meds  4) PCOS  5) Prediabetic, 2hr GTT today  Meds:  Meds ordered this encounter  Medications  . Blood Pressure Monitor MISC    Sig: For regular home bp monitoring during pregnancy    Dispense:  1 each    Refill:  0    Z34.90 Needs large cuff    Initial labs obtained Continue prenatal vitamins Reviewed n/v relief measures and warning s/s to report Reviewed recommended weight gain based on pre-gravid BMI Encouraged well-balanced diet Genetic Screening discussed: declined Cystic fibrosis, SMA, Fragile X screening discussed declined Ultrasound discussed; fetal survey: requested [facility]NC completed>PCM not here, form faxed The nature of [facility] [employer] with multiple MDs and other Advanced Practice Providers was explained to patient; also emphasized  that fellows, residents, and students are part of our team. Ordered home bp cuff. Check bp weekly, let us know if >140/90.   Too late for ASA therapy    Follow-up: Return in about 2 weeks (around 06/26/2019) for [place], in person.   Orders Placed This Encounter  Procedures  . GC/Chlamydia Probe Amp  . Pain Management Screening Profile (10S)  . POC Urinalysis Dipstick OB    Myrtis Ser [facility] 06/12/2019 1:08 PM

## 2019-06-13 ENCOUNTER — Ambulatory Visit: Payer: Medicare Other | Admitting: *Deleted

## 2019-06-13 ENCOUNTER — Encounter: Payer: Self-pay | Admitting: Obstetrics and Gynecology

## 2019-06-13 ENCOUNTER — Encounter: Payer: Medicare Other | Admitting: Advanced Practice Midwife

## 2019-06-13 ENCOUNTER — Telehealth: Payer: Self-pay | Admitting: Obstetrics and Gynecology

## 2019-06-13 ENCOUNTER — Other Ambulatory Visit: Payer: Self-pay | Admitting: Obstetrics and Gynecology

## 2019-06-13 DIAGNOSIS — O0990 Supervision of high risk pregnancy, unspecified, unspecified trimester: Secondary | ICD-10-CM

## 2019-06-13 DIAGNOSIS — O0930 Supervision of pregnancy with insufficient antenatal care, unspecified trimester: Secondary | ICD-10-CM | POA: Insufficient documentation

## 2019-06-13 DIAGNOSIS — O24410 Gestational diabetes mellitus in pregnancy, diet controlled: Secondary | ICD-10-CM

## 2019-06-13 LAB — PMP SCREEN PROFILE (10S), URINE
Amphetamine Scrn, Ur: NEGATIVE ng/mL
BARBITURATE SCREEN URINE: NEGATIVE ng/mL
BENZODIAZEPINE SCREEN, URINE: NEGATIVE ng/mL
CANNABINOIDS UR QL SCN: NEGATIVE ng/mL
Cocaine (Metab) Scrn, Ur: NEGATIVE ng/mL
Creatinine(Crt), U: 194.6 mg/dL (ref 20.0–300.0)
Methadone Screen, Urine: NEGATIVE ng/mL
OXYCODONE+OXYMORPHONE UR QL SCN: NEGATIVE ng/mL
Opiate Scrn, Ur: NEGATIVE ng/mL
Ph of Urine: 6.8 (ref 4.5–8.9)
Phencyclidine Qn, Ur: NEGATIVE ng/mL
Propoxyphene Scrn, Ur: NEGATIVE ng/mL

## 2019-06-13 LAB — MED LIST OPTION NOT SELECTED

## 2019-06-13 MED ORDER — ACCU-CHEK FASTCLIX LANCETS MISC: 1.0000 [IU] | Freq: Four times a day (QID) | 12 refills | Status: DC

## 2019-06-13 MED ORDER — ACCU-CHEK SMARTVIEW VI STRP: ORAL_STRIP | 12 refills | Status: DC

## 2019-06-13 NOTE — Progress Notes (Signed)
Will reach out to pt for glucometer and supplies. Left message for pt to call me .  Glucometer, lancets and test strips ordered.

## 2019-06-13 NOTE — Telephone Encounter (Signed)
Left message for pt to call office or me to make her aware of abnormal GTT results. Glucometer, lancets and strips to be ordered.

## 2019-06-14 ENCOUNTER — Other Ambulatory Visit: Payer: Self-pay | Admitting: *Deleted

## 2019-06-14 ENCOUNTER — Encounter: Payer: Self-pay | Admitting: *Deleted

## 2019-06-14 DIAGNOSIS — O24410 Gestational diabetes mellitus in pregnancy, diet controlled: Secondary | ICD-10-CM

## 2019-06-14 DIAGNOSIS — O0990 Supervision of high risk pregnancy, unspecified, unspecified trimester: Secondary | ICD-10-CM

## 2019-06-14 DIAGNOSIS — O24419 Gestational diabetes mellitus in pregnancy, unspecified control: Secondary | ICD-10-CM | POA: Insufficient documentation

## 2019-06-14 LAB — GC/CHLAMYDIA PROBE AMP
Chlamydia trachomatis, NAA: NEGATIVE
Neisseria Gonorrhoeae by PCR: NEGATIVE

## 2019-06-17 ENCOUNTER — Other Ambulatory Visit: Payer: Self-pay | Admitting: *Deleted

## 2019-06-17 ENCOUNTER — Encounter: Payer: Self-pay | Admitting: *Deleted

## 2019-06-17 DIAGNOSIS — O0990 Supervision of high risk pregnancy, unspecified, unspecified trimester: Secondary | ICD-10-CM

## 2019-06-17 DIAGNOSIS — O24410 Gestational diabetes mellitus in pregnancy, diet controlled: Secondary | ICD-10-CM

## 2019-06-17 LAB — HEMOGLOBINOPATHY EVALUATION
HGB C: 0 %
HGB S: 0 %
HGB VARIANT: 0 %
Hemoglobin A2 Quantitation: 2 % (ref 1.8–3.2)
Hemoglobin F Quantitation: 0 % (ref 0.0–2.0)
Hgb A: 98 % (ref 96.4–98.8)

## 2019-06-17 LAB — OBSTETRIC PANEL, INCLUDING HIV
Antibody Screen: NEGATIVE
Basophils Absolute: 0 10*3/uL (ref 0.0–0.2)
Basos: 0 %
EOS (ABSOLUTE): 0 10*3/uL (ref 0.0–0.4)
Eos: 0 %
HIV Screen 4th Generation wRfx: NONREACTIVE
Hematocrit: 34.6 % (ref 34.0–46.6)
Hemoglobin: 11.3 g/dL (ref 11.1–15.9)
Hepatitis B Surface Ag: NEGATIVE
Immature Grans (Abs): 0 10*3/uL (ref 0.0–0.1)
Immature Granulocytes: 0 %
Lymphocytes Absolute: 2.7 10*3/uL (ref 0.7–3.1)
Lymphs: 27 %
MCH: 26.5 pg — ABNORMAL LOW (ref 26.6–33.0)
MCHC: 32.7 g/dL (ref 31.5–35.7)
MCV: 81 fL (ref 79–97)
Monocytes Absolute: 0.5 10*3/uL (ref 0.1–0.9)
Monocytes: 5 %
Neutrophils Absolute: 6.7 10*3/uL (ref 1.4–7.0)
Neutrophils: 68 %
Platelets: 265 10*3/uL (ref 150–450)
RBC: 4.26 x10E6/uL (ref 3.77–5.28)
RDW: 13.5 % (ref 11.7–15.4)
RPR Ser Ql: NONREACTIVE
Rh Factor: POSITIVE
Rubella Antibodies, IGG: 4.09 index (ref >0.99)
WBC: 10 10*3/uL (ref 3.4–10.8)

## 2019-06-17 LAB — GLUCOSE TOLERANCE, 2 HOURS W/ 1HR
Glucose, 1 hour: 207 mg/dL — ABNORMAL HIGH (ref 65–179)
Glucose, 2 hour: 168 mg/dL — ABNORMAL HIGH (ref 65–152)
Glucose, Fasting: 110 mg/dL — ABNORMAL HIGH (ref 65–91)

## 2019-06-17 LAB — HEPATITIS C ANTIBODY: Hep C Virus Ab: 0.5 s/co ratio (ref 0.0–0.9)

## 2019-06-17 MED ORDER — ACCU-CHEK GUIDE VI STRP: ORAL_STRIP | 12 refills | Status: DC

## 2019-06-17 MED ORDER — ACCU-CHEK SOFTCLIX LANCETS MISC: 12 refills | Status: DC

## 2019-06-17 MED ORDER — ACCU-CHEK GUIDE ME W/DEVICE KIT: 1.0000 | PACK | Freq: Four times a day (QID) | 0 refills | Status: DC

## 2019-06-19 MED ORDER — ACCU-CHEK GUIDE VI STRP: ORAL_STRIP | 12 refills | Status: DC

## 2019-06-19 MED ORDER — ACCU-CHEK SOFTCLIX LANCETS MISC: 12 refills | Status: DC

## 2019-06-22 ENCOUNTER — Other Ambulatory Visit: Payer: Self-pay

## 2019-06-22 DIAGNOSIS — O0990 Supervision of high risk pregnancy, unspecified, unspecified trimester: Secondary | ICD-10-CM

## 2019-06-22 DIAGNOSIS — O24410 Gestational diabetes mellitus in pregnancy, diet controlled: Secondary | ICD-10-CM

## 2019-06-24 ENCOUNTER — Other Ambulatory Visit: Payer: Self-pay | Admitting: *Deleted

## 2019-06-24 ENCOUNTER — Telehealth: Payer: Self-pay | Admitting: *Deleted

## 2019-06-24 DIAGNOSIS — O24410 Gestational diabetes mellitus in pregnancy, diet controlled: Secondary | ICD-10-CM

## 2019-06-24 DIAGNOSIS — O0990 Supervision of high risk pregnancy, unspecified, unspecified trimester: Secondary | ICD-10-CM

## 2019-06-24 MED ORDER — ACCU-CHEK GUIDE VI STRP: ORAL_STRIP | 12 refills | Status: DC

## 2019-06-24 MED ORDER — ACCU-CHEK SOFTCLIX LANCETS MISC: 12 refills | Status: DC

## 2019-06-24 MED ORDER — ACCU-CHEK GUIDE ME W/DEVICE KIT: 1.0000 | PACK | Freq: Four times a day (QID) | 0 refills | Status: DC

## 2019-06-24 NOTE — Telephone Encounter (Signed)
Unable to reach patient regarding diabetic supplies.  Spoke to mother and informed that I am still working on getting her supplies. Pharmacy spoke to [facility] and was advised for [place] to try a PA for the supplies to be covered.  The only other option is for her to pay out of pocket for the supplies.  informed I will attempt PA tomorrow. In he meantime, patient should just watch diet.  Mother stated she had already talked with patient about this and she is doing this daily.  Will call back tomorrow.

## 2019-06-25 ENCOUNTER — Ambulatory Visit: Payer: Medicare Other | Admitting: Registered"

## 2019-06-25 ENCOUNTER — Telehealth: Payer: Self-pay | Admitting: *Deleted

## 2019-06-25 ENCOUNTER — Other Ambulatory Visit: Payer: Self-pay

## 2019-06-25 ENCOUNTER — Encounter: Payer: Medicare Other | Attending: Obstetrics and Gynecology | Admitting: Registered"

## 2019-06-25 DIAGNOSIS — Z713 Dietary counseling and surveillance: Secondary | ICD-10-CM | POA: Diagnosis not present

## 2019-06-25 DIAGNOSIS — O24410 Gestational diabetes mellitus in pregnancy, diet controlled: Secondary | ICD-10-CM | POA: Insufficient documentation

## 2019-06-25 DIAGNOSIS — O09529 Supervision of elderly multigravida, unspecified trimester: Secondary | ICD-10-CM | POA: Insufficient documentation

## 2019-06-25 DIAGNOSIS — Z3A00 Weeks of gestation of pregnancy not specified: Secondary | ICD-10-CM | POA: Insufficient documentation

## 2019-06-25 NOTE — Telephone Encounter (Signed)
Encounter closed. JSY 

## 2019-06-26 ENCOUNTER — Ambulatory Visit (INDEPENDENT_AMBULATORY_CARE_PROVIDER_SITE_OTHER): Payer: Medicare Other | Admitting: Women's Health

## 2019-06-26 ENCOUNTER — Encounter: Payer: Self-pay | Admitting: Women's Health

## 2019-06-26 ENCOUNTER — Other Ambulatory Visit: Payer: Self-pay

## 2019-06-26 ENCOUNTER — Telehealth: Payer: Self-pay | Admitting: *Deleted

## 2019-06-26 VITALS — BP 145/84 | HR 118 | Wt 335.2 lb

## 2019-06-26 DIAGNOSIS — Z23 Encounter for immunization: Secondary | ICD-10-CM | POA: Diagnosis not present

## 2019-06-26 DIAGNOSIS — O24410 Gestational diabetes mellitus in pregnancy, diet controlled: Secondary | ICD-10-CM

## 2019-06-26 DIAGNOSIS — Z331 Pregnant state, incidental: Secondary | ICD-10-CM

## 2019-06-26 DIAGNOSIS — O99313 Alcohol use complicating pregnancy, third trimester: Secondary | ICD-10-CM

## 2019-06-26 DIAGNOSIS — O0990 Supervision of high risk pregnancy, unspecified, unspecified trimester: Secondary | ICD-10-CM

## 2019-06-26 DIAGNOSIS — O99213 Obesity complicating pregnancy, third trimester: Secondary | ICD-10-CM

## 2019-06-26 DIAGNOSIS — Z1389 Encounter for screening for other disorder: Secondary | ICD-10-CM

## 2019-06-26 DIAGNOSIS — Z3A33 33 weeks gestation of pregnancy: Secondary | ICD-10-CM

## 2019-06-26 DIAGNOSIS — O0933 Supervision of pregnancy with insufficient antenatal care, third trimester: Secondary | ICD-10-CM

## 2019-06-26 DIAGNOSIS — I890 Lymphedema, not elsewhere classified: Secondary | ICD-10-CM

## 2019-06-26 DIAGNOSIS — O0993 Supervision of high risk pregnancy, unspecified, third trimester: Secondary | ICD-10-CM

## 2019-06-26 LAB — POCT URINALYSIS DIPSTICK OB
Blood, UA: NEGATIVE
Glucose, UA: NEGATIVE
Ketones, UA: NEGATIVE
Leukocytes, UA: NEGATIVE
Nitrite, UA: NEGATIVE
POC,PROTEIN,UA: NEGATIVE

## 2019-06-26 NOTE — Patient Instructions (Addendum)
[name], I greatly value your feedback.  If you receive a survey following your visit with [place] today, we appreciate you taking the time to fill it out.  Thanks, Knute Neu, CNM, [facility]  [place]!!! It is now [facility] ([place], [place] [address]) Entrance located off of [address] parking    Go to [email or web address] to register for FREE online childbirth classes   Call the office [phone]) or go to [facility] if:  You begin to have strong, frequent contractions  Your water breaks.  Sometimes it is a big gush of fluid, sometimes it is just a trickle that keeps getting your panties wet or running down your legs  You have vaginal bleeding.  It is normal to have a small amount of spotting if your cervix was checked.   You don't feel your baby moving like normal.  If you don't, get you something to eat and drink and lay down and focus on feeling your baby move.  You should feel at least 10 movements in 2 hours.  If you don't, you should call the office or go to [place] blood sugars 4 times a day: in the morning before eating/drinking anything (<95) and 2 hours after your first bite of breakfast, lunch, and supper (<120).    Tdap Vaccine  It is recommended that you get the Tdap vaccine during the third trimester of EACH pregnancy to help protect your baby from getting pertussis (whooping cough)  27-36 weeks is the BEST time to do this so that you can pass the protection on to your baby. During pregnancy is better than after pregnancy, but if you are unable to get it during pregnancy it will be offered at the hospital.   You can get this vaccine with [place], at the health department, your family doctor, or some local pharmacies  Everyone who will be around your baby should also be up-to-date on their vaccines before the baby comes. Adults (who are not pregnant) only need 1 dose  of Tdap during adulthood.   [place] Pediatricians/Family Doctors:  [facility] [phone]                 [place] [phone] (usually not accepting new patients unless you have family there already, you are always welcome to call and ask)       [facility] [phone]       [facility] Pediatricians/Family Doctors:   [facility]: [phone]  [facility]: [phone]  [facility]: [place] Doctors:   [facility]: [facility]: [place]:  [place]: [phone]   Home Blood Pressure Monitoring for Patients   Your provider has recommended that you check your blood pressure (BP) at least once a week at home. If you do not have a blood pressure cuff at home, one will be provided for you. Contact your provider if you have not received your monitor within 1 week.   Helpful Tips for Accurate Home Blood Pressure Checks  . Don't smoke, exercise, or drink caffeine 30 minutes before checking your BP . Use the restroom before checking your BP (a full bladder can raise your pressure) . Relax in a comfortable upright chair .  Feet on the ground . Left arm resting comfortably on a flat surface at the level of your heart . Legs uncrossed . Back supported . Sit quietly and don't talk . Place the cuff on your bare arm . Adjust snuggly, so that only two fingertips can fit between your skin and the top of the cuff . Check 2 readings separated by at least one minute . Keep a log of your BP readings . For a visual, please reference this diagram: http://ccnc.care/bpdiagram  Provider Name: [facility]     Phone: [phone]  Zone 1: ALL CLEAR  Continue to monitor your symptoms:  . BP reading is less than 140 (top  number) or less than 90 (bottom number)  . No right upper stomach pain . No headaches or seeing spots . No feeling nauseated or throwing up . No swelling in face and hands  Zone 2: CAUTION Call your doctor's office for any of the following:  . BP reading is greater than 140 (top number) or greater than 90 (bottom number)  . Stomach pain under your ribs in the middle or right side . Headaches or seeing spots . Feeling nauseated or throwing up . Swelling in face and hands  Zone 3: EMERGENCY  Seek immediate medical care if you have any of the following:  . BP reading is greater than160 (top number) or greater than 110 (bottom number) . Severe headaches not improving with Tylenol . Serious difficulty catching your breath . Any worsening symptoms from Zone 2    Gestational Diabetes Mellitus, Diagnosis Gestational diabetes (gestational diabetes mellitus) is a short-term (temporary) form of diabetes that can happen during pregnancy. It goes away after you give birth. It may be caused by one or both of these problems:  Your pancreas does not make enough of a hormone called insulin.  Your body does not respond in a normal way to insulin that it makes. Insulin lets sugars (glucose) go into cells in the body. This gives you energy. If you have diabetes, sugars cannot get into cells. This causes high blood sugar (hyperglycemia). If you get gestational diabetes, you are:  More likely to get it if you get pregnant again.  More likely to develop type 2 diabetes in the future. If gestational diabetes is treated, it may not hurt you or your baby. Your doctor will set treatment goals for you. In general, you should have these blood sugar levels:  After not eating for a long time (fasting): 95 mg/dL (5.3 mmol/L).  After meals (postprandial): ? One hour after a meal: at or below 140 mg/dL (7.8 mmol/L). ? Two hours after a meal: at or below 120 mg/dL (6.7 mmol/L).  A1c (hemoglobin A1c) level:  6-6.5%. Follow these instructions at home: Questions to ask your doctor   You may want to ask these questions: ? Do I need to meet with a diabetes educator? ? What equipment will I need to care for myself at home? ? What medicines do I need? When should I take them? ? How often do I need to check my blood sugar? ? What number can I call if I have questions? ? When is my next doctor's visit? General instructions  Take over-the-counter and prescription medicines only as told by your doctor.  Stay at a healthy weight during pregnancy.  Keep all follow-up visits as told by your doctor. This is important. Contact a doctor if:  Your blood sugar is at or above 240 mg/dL (15.4 mmol/L).  Your blood sugar is at or above 200 mg/dL (00.8 mmol/L) and you have ketones in your pee (urine).  You have been sick or have had a fever for 2 days or more and you are not getting better.  You have any of these problems for more than 6 hours: ? You cannot eat or drink. ? You feel sick to your stomach (nauseous). ? You throw up (vomit). ? You have watery poop (diarrhea). Get help right away if:  Your blood sugar is lower than 54 mg/dL (3 mmol/L).  You get confused.  You have trouble: ? Thinking clearly. ? Breathing.  Your baby moves less than normal.  You have any of these: ? Moderate or large ketone levels in your pee. ? Blood coming from your vagina. ? Unusual fluid coming from your vagina. ? Early contractions. These may feel like tightness in your belly. Summary  Gestational diabetes is a short-term form of diabetes. It can happen while you are pregnant. It goes away after you give birth.  If gestational diabetes is treated, it may not hurt you or your baby. Your doctor will set treatment goals for you.  Keep all follow-up visits as told by your doctor. This is important. This information is not intended to replace advice given to you by your health care provider. Make sure you  discuss any questions you have with your health care provider. Document Revised: 05/18/2017 Document Reviewed: 05/15/2015 Elsevier Patient Education  2020 [employer].   Third Trimester of Pregnancy The third trimester is from week 29 through week 42, months 7 through 9. The third trimester is a time when the fetus is growing rapidly. At the end of the ninth month, the fetus is about 20 inches in length and weighs 6-10 pounds.  BODY CHANGES Your body goes through many changes during pregnancy. The changes vary from woman to woman.   Your weight will continue to increase. You can expect to gain 25-35 pounds (11-16 kg) by the end of the pregnancy.  You may begin to get stretch marks on your hips, abdomen, and breasts.  You may urinate more often because the fetus is moving lower into your pelvis and pressing on your bladder.  You may develop or continue to have heartburn as a result of your pregnancy.  You may develop constipation because certain hormones are causing the muscles that push waste through your intestines to slow down.  You may develop hemorrhoids or swollen, bulging veins (varicose veins).  You may have pelvic pain because of the weight gain and pregnancy hormones relaxing your joints between the bones in your pelvis. Backaches may result from overexertion of the muscles supporting your posture.  You may have changes in your hair. These can include thickening of your hair, rapid growth, and changes in texture. Some women also have hair loss during or after pregnancy, or hair that feels dry or thin. Your hair will most likely return to normal after your baby is born.  Your breasts will continue to grow and be tender. A yellow discharge may leak from your breasts called colostrum.  Your belly button may stick out.  You may feel short of breath because of your expanding uterus.  You may notice the fetus "dropping," or moving lower in your abdomen.  You may have a bloody  mucus discharge. This usually occurs a few days to a week before labor begins.  Your cervix becomes thin and soft (effaced) near your due date. WHAT TO EXPECT  AT YOUR PRENATAL EXAMS  You will have prenatal exams every 2 weeks until week 36. Then, you will have weekly prenatal exams. During a routine prenatal visit:  You will be weighed to make sure you and the fetus are growing normally.  Your blood pressure is taken.  Your abdomen will be measured to track your baby's growth.  The fetal heartbeat will be listened to.  Any test results from the previous visit will be discussed.  You may have a cervical check near your due date to see if you have effaced. At around 36 weeks, your caregiver will check your cervix. At the same time, your caregiver will also perform a test on the secretions of the vaginal tissue. This test is to determine if a type of bacteria, Group B streptococcus, is present. Your caregiver will explain this further. Your caregiver may ask you:  What your birth plan is.  How you are feeling.  If you are feeling the baby move.  If you have had any abnormal symptoms, such as leaking fluid, bleeding, severe headaches, or abdominal cramping.  If you have any questions. Other tests or screenings that may be performed during your third trimester include:  Blood tests that check for low iron levels (anemia).  Fetal testing to check the health, activity level, and growth of the fetus. Testing is done if you have certain medical conditions or if there are problems during the pregnancy. FALSE LABOR You may feel small, irregular contractions that eventually go away. These are called Braxton Hicks contractions, or false labor. Contractions may last for hours, days, or even weeks before true labor sets in. If contractions come at regular intervals, intensify, or become painful, it is best to be seen by your caregiver.  SIGNS OF LABOR   Menstrual-like cramps.  Contractions  that are 5 minutes apart or less.  Contractions that start on the top of the uterus and spread down to the lower abdomen and back.  A sense of increased pelvic pressure or back pain.  A watery or bloody mucus discharge that comes from the vagina. If you have any of these signs before the 37th week of pregnancy, call your caregiver right away. You need to go to the hospital to get checked immediately. HOME CARE INSTRUCTIONS   Avoid all smoking, herbs, alcohol, and unprescribed drugs. These chemicals affect the formation and growth of the baby.  Follow your caregiver's instructions regarding medicine use. There are medicines that are either safe or unsafe to take during pregnancy.  Exercise only as directed by your caregiver. Experiencing uterine cramps is a good sign to stop exercising.  Continue to eat regular, healthy meals.  Wear a good support bra for breast tenderness.  Do not use hot tubs, steam rooms, or saunas.  Wear your seat belt at all times when driving.  Avoid raw meat, uncooked cheese, cat litter boxes, and soil used by cats. These carry germs that can cause birth defects in the baby.  Take your prenatal vitamins.  Try taking a stool softener (if your caregiver approves) if you develop constipation. Eat more high-fiber foods, such as fresh vegetables or fruit and whole grains. Drink plenty of fluids to keep your urine clear or pale yellow.  Take warm sitz baths to soothe any pain or discomfort caused by hemorrhoids. Use hemorrhoid cream if your caregiver approves.  If you develop varicose veins, wear support hose. Elevate your feet for 15 minutes, 3-4 times a day. Limit salt in your  diet.  Avoid heavy lifting, wear low heal shoes, and practice good posture.  Rest a lot with your legs elevated if you have leg cramps or low back pain.  Visit your dentist if you have not gone during your pregnancy. Use a soft toothbrush to brush your teeth and be gentle when you  floss.  A sexual relationship may be continued unless your caregiver directs you otherwise.  Do not travel far distances unless it is absolutely necessary and only with the approval of your caregiver.  Take prenatal classes to understand, practice, and ask questions about the labor and delivery.  Make a trial run to the hospital.  Pack your hospital bag.  Prepare the baby's nursery.  Continue to go to all your prenatal visits as directed by your caregiver. SEEK MEDICAL CARE IF:  You are unsure if you are in labor or if your water has broken.  You have dizziness.  You have mild pelvic cramps, pelvic pressure, or nagging pain in your abdominal area.  You have persistent nausea, vomiting, or diarrhea.  You have a bad smelling vaginal discharge.  You have pain with urination. SEEK IMMEDIATE MEDICAL CARE IF:   You have a fever.  You are leaking fluid from your vagina.  You have spotting or bleeding from your vagina.  You have severe abdominal cramping or pain.  You have rapid weight loss or gain.  You have shortness of breath with chest pain.  You notice sudden or extreme swelling of your face, hands, ankles, feet, or legs.  You have not felt your baby move in over an hour.  You have severe headaches that do not go away with medicine.  You have vision changes. Document Released: 04/05/2001 Document Revised: 04/16/2013 Document Reviewed: 06/12/2012 [facility] Patient Information 2015 [place] [place]. This information is not intended to replace advice given to you by your health care provider. Make sure you discuss any questions you have with your health care provider.  PROTECT YOURSELF & YOUR BABY FROM THE FLU! Because you are pregnant, we at [facility], along with the Centers for Disease Control (CDC), recommend that you receive the flu vaccine to protect yourself and your baby from the flu. The flu is more likely to cause severe illness in pregnant women than in women  of reproductive age who are not pregnant. Changes in the immune system, heart, and lungs during pregnancy make pregnant women (and women up to two weeks postpartum) more prone to severe illness from flu, including illness resulting in hospitalization. Flu also may be harmful for a pregnant woman's developing baby. A common flu symptom is fever, which may be associated with neural tube defects and other adverse outcomes for a developing baby. Getting vaccinated can also help protect a baby after birth from flu. (Mom passes antibodies onto the developing baby during her pregnancy.)  A Flu Vaccine is the Best Protection Against Flu Getting a flu vaccine is the first and most important step in protecting against flu. Pregnant women should get a flu shot and not the live attenuated influenza vaccine (LAIV), also known as nasal spray flu vaccine. Flu vaccines given during pregnancy help protect both the mother and her baby from flu. Vaccination has been shown to reduce the risk of flu-associated acute respiratory infection in pregnant women by up to one-half. A 2018 study showed that getting a flu shot reduced a pregnant woman's risk of being hospitalized with flu by an average of 40 percent. Pregnant women who get a  flu vaccine are also helping to protect their babies from flu illness for the first several months after their birth, when they are too young to get vaccinated.   A Long Record of Safety for Flu Shots in Pregnant Women Flu shots have been given to millions of pregnant women over many years with a good safety record. There is a lot of evidence that flu vaccines can be given safely during pregnancy; though these data are limited for the first trimester. The CDC recommends that pregnant women get vaccinated during any trimester of their pregnancy. It is very important for pregnant women to get the flu shot.   Other Preventive Actions In addition to getting a flu shot, pregnant women should take the same  everyday preventive actions the CDC recommends of everyone, including covering coughs, washing hands often, and avoiding people who are sick.  Symptoms and Treatment If you get sick with flu symptoms call your doctor right away. There are antiviral drugs that can treat flu illness and prevent serious flu complications. The CDC recommends prompt treatment for people who have influenza infection or suspected influenza infection and who are at high risk of serious flu complications, such as people with asthma, diabetes (including gestational diabetes), or heart disease. Early treatment of influenza in hospitalized pregnant women has been shown to reduce the length of the hospital stay.  Symptoms Flu symptoms include fever, cough, sore throat, runny or stuffy nose, body aches, headache, chills and fatigue. Some people may also have vomiting and diarrhea. People may be infected with the flu and have respiratory symptoms without a fever.  Early Treatment is Important for Pregnant Women Treatment should begin as soon as possible because antiviral drugs work best when started early (within 48 hours after symptoms start). Antiviral drugs can make your flu illness milder and make you feel better faster. They may also prevent serious health problems that can result from flu illness. Oral oseltamivir (Tamiflu) is the preferred treatment for pregnant women because it has the most studies available to suggest that it is safe and beneficial. Antiviral drugs require a prescription from your provider. Having a fever caused by flu infection or other infections early in pregnancy may be linked to birth defects in a baby. In addition to taking antiviral drugs, pregnant women who get a fever should treat their fever with Tylenol (acetaminophen) and contact their provider immediately.  When to Seek Emergency Medical Care If you are pregnant and have any of these signs, seek care immediately:  Difficulty breathing or  shortness of breath  Pain or pressure in the chest or abdomen  Sudden dizziness  Confusion  Severe or persistent vomiting  High fever that is not responding to Tylenol (or store brand equivalent)  Decreased or no movement of your baby  [email or web address]

## 2019-06-26 NOTE — Progress Notes (Signed)
HIGH-RISK PREGNANCY VISIT Patient name: [name] MRN [id number]  Date of birth: [birth year] Chief Complaint:   High Risk Gestation  History of Present Illness:   [name] is a 34 y.o. G75P0010 female at [redacted]w[redacted]d with an Estimated Date of Delivery: 08/11/19 being seen today for ongoing management of a high-risk pregnancy complicated by diabetes mellitus A1DM, late prenatal care, lymphedema, PCOS, obesity, etoh use until late Jan (didn't know pregnant). No h/o HTN. Today she reports hasn't been able to start checking sugars yet, medicare won't cover supplies. Contractions: Irregular. Vag. Bleeding: None.  Movement: Present. denies leaking of fluid.  Review of Systems:   Pertinent items are noted in HPI Denies abnormal vaginal discharge w/ itching/odor/irritation, headaches, visual changes, shortness of breath, chest pain, abdominal pain, severe nausea/vomiting, or problems with urination or bowel movements unless otherwise stated above. Pertinent History Reviewed:  Reviewed past medical,surgical, social, obstetrical and family history.  Reviewed problem list, medications and allergies. Physical Assessment:   Vitals:   06/26/19 0950  BP: (!) 145/84  Pulse: (!) 118  Weight: (!) 335 lb 3.2 oz (152 kg)  Body mass index is 49.5 kg/m.           Physical Examination:   General appearance: alert, well appearing, and in no distress  Mental status: alert, oriented to person, place, and time  Skin: warm & dry   Extremities: Edema: Trace    Cardiovascular: normal heart rate noted  Respiratory: normal respiratory effort, no distress  Abdomen: gravid, soft, non-tender  Pelvic: Cervical exam deferred         Fetal Status: Fetal Heart Rate (bpm): 135 Fundal Height: 36 cm Movement: Present    Fetal Surveillance Testing today: doppler   Chaperone: n/a    Results for orders placed or performed in visit on 06/26/19 (from the past 24 hour(s))  POC Urinalysis Dipstick OB   Collection Time: 06/26/19  9:51 AM  Result Value Ref Range   Color, UA     Clarity, UA     Glucose, UA Negative Negative   Bilirubin, UA     Ketones, UA neg    Spec Grav, UA     Blood, UA neg    pH, UA     POC,PROTEIN,UA Negative Negative, Trace, Small (1+), Moderate (2+), Large (3+), 4+   Urobilinogen, UA     Nitrite, UA neg    Leukocytes, UA Negative Negative   Appearance     Odor      Assessment & Plan:  1) High-risk pregnancy [name] at [redacted]w[redacted]d with an Estimated Date of Delivery: 08/11/19   2) GDM, hasn't started checking sugars yet, Medicare wouldn't pay for supplies, Tish has called and pt can get free meter from [facility] and other supplies from [employer] at low-cost. To pick up today and start checking QID as directed. Went to dietician yesterday. F/U 1wk to see how sugars are doing.   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   Labs/procedures today: tdap, declined flu shot  Treatment Plan:  Get supplies, start checking sugars asap, return 1wk  Reviewed: Preterm labor symptoms and general obstetric precautions including but not limited to vaginal bleeding, contractions, leaking of fluid and fetal movement were reviewed in detail with the patient.  All  questions were answered.   Follow-up: Return in about 1 week (around 07/03/2019) for [place], in person, MD.  Orders Placed This Encounter  Procedures  . Tdap vaccine greater than or equal to 7yo IM  . POC Urinalysis Dipstick OB   Roma Schanz CNM, [facility] 06/26/2019 2:30 PM

## 2019-06-26 NOTE — Telephone Encounter (Signed)
Called [employer] to attempt PA but they state they cannot find her in their system with her name, DOB and zip code.  I provided other zip codes for previous addresses lived, but still unsuccessful. I did speak to someone at the [facility] Department in hopes they could provide some assistance to her, however, they only provide diabetic supplies to their patients.  Pharmacist was willing to provide the patient with a free meter but patient would have to go to [place] to purchase supplies.    Patient is currently in our office for an appt today.  Advised patient to go to [facility] after visit to pick up meter and then to [employer] to get supplies. Assisted with setting up [employer]s and instructed on how to log blood sugars in app. Pt verbalized understanding and stated she would.

## 2019-06-27 NOTE — Progress Notes (Signed)
  Patient was seen on 06/25/19 for Gestational Diabetes self-management. EDD 08/10/20. Patient states no history of GDM. Diet history obtained. Patient eats some variety of all food groups, has cut back on carbs. Beverages include water.  The following learning objectives were met by the patient :   States the definition of Gestational Diabetes  States why dietary management is important in controlling blood glucose  Describes the effects of carbohydrates on blood glucose levels  Demonstrates ability to create a balanced meal plan  Demonstrates carbohydrate counting   States when to check blood glucose levels  Demonstrates proper blood glucose monitoring techniques  States the effect of stress and exercise on blood glucose levels  States the importance of limiting caffeine and abstaining from alcohol and smoking  Plan:  Aim for 3 Carb Choices per meal (45 grams) +/- 1 either way  Aim for 1-2 Carbs per snack Begin reading food labels for Total Carbohydrate of foods If OK with your MD, consider  increasing your activity level by walking, Arm Chair Exercises or other activity daily as tolerated Begin checking BG before breakfast and 2 hours after first bite of breakfast, lunch and dinner as directed by MD  Bring Log Book/Sheet and meter to every medical appointment OR use Baby Scripts (see below) Baby Scripts:  Patient not using Baby Scripts Take medication if directed by MD  Blood glucose monitor given: Accu-chek Guide Me Lot #[id number] Exp: 06/26/2020  Patient already has a meter Rx  Patient instructed to test pre breakfast and 2 hours each meal as directed by MD  Patient instructed to monitor glucose levels: FBS: 60 - 95 mg/dl 2 hour: <120 mg/dl  Patient received the following handouts:  Nutrition Diabetes and Pregnancy  Carbohydrate Counting List  BG Log Sheet  Patient will be seen for follow-up in as needed.

## 2019-06-28 ENCOUNTER — Telehealth: Payer: Self-pay | Admitting: Registered"

## 2019-06-28 NOTE — Telephone Encounter (Signed)
Called patient after she started checking her blood sugar for gestational diabetes. Pt is keeping notes on her food choices related to blood sugar in Babyscripts. With initial checking it appears she is successfully adjusting intake to bring blood sugar readings into target range.  Pt states her mother told her she is not eating enough food. Pt states she wants to lose weight. RD encouraged patient not to go hungry in an effort to lose weight at this time. RD asked patient to get a nutritional referral for PCOS and come in for a visit with me after the baby is born.

## 2019-07-03 ENCOUNTER — Ambulatory Visit (INDEPENDENT_AMBULATORY_CARE_PROVIDER_SITE_OTHER): Payer: Medicare Other | Admitting: Obstetrics and Gynecology

## 2019-07-03 ENCOUNTER — Other Ambulatory Visit: Payer: Self-pay

## 2019-07-03 ENCOUNTER — Encounter: Payer: Self-pay | Admitting: Obstetrics and Gynecology

## 2019-07-03 ENCOUNTER — Telehealth: Payer: Self-pay | Admitting: Obstetrics and Gynecology

## 2019-07-03 VITALS — BP 114/73 | HR 94 | Wt 344.3 lb

## 2019-07-03 DIAGNOSIS — Z1389 Encounter for screening for other disorder: Secondary | ICD-10-CM

## 2019-07-03 DIAGNOSIS — Z331 Pregnant state, incidental: Secondary | ICD-10-CM

## 2019-07-03 DIAGNOSIS — O0993 Supervision of high risk pregnancy, unspecified, third trimester: Secondary | ICD-10-CM

## 2019-07-03 LAB — POCT URINALYSIS DIPSTICK OB
Blood, UA: NEGATIVE
Glucose, UA: NEGATIVE
Ketones, UA: NEGATIVE
Leukocytes, UA: NEGATIVE
Nitrite, UA: NEGATIVE
POC,PROTEIN,UA: NEGATIVE

## 2019-07-03 MED ORDER — METFORMIN HCL 500 MG PO TABS: 500.0000 mg | ORAL_TABLET | Freq: Two times a day (BID) | ORAL | 3 refills | Status: DC

## 2019-07-03 NOTE — Telephone Encounter (Signed)
Patient informed that Metformin 500 mg twice daily has been called into her drugstore of record

## 2019-07-03 NOTE — Progress Notes (Signed)
Patient ID: [name], female   DOB: Apr 17, 1986, 34 y.o.   MRN: [id number]    [facility] PREGNANCY VISIT Patient name: [name] MRN [id number]  Date of birth: [birth year] Chief Complaint:   High Risk Gestation  History of Present Illness:   [name] is a 34 y.o. G80P0010 female at [redacted]w[redacted]d with an Estimated Date of Delivery: 08/11/19 being seen today for ongoing management of a high-risk pregnancy complicated by diabetes mellitus A1DM (not on any medications), late prenatal care, lymphedema, PCOS, obesity, and EtOH use until mid January because she didn't know that she was pregnant. The patient has been monitoring her blood glucose levels since 06/26/2019. Three out of eight fasting sugars have been over 100, the highest being 112. Postprandials range from 86-197. Today she reports no complaints Contractions: Not present.  .  Movement: Present. denies leaking of fluid.  Review of Systems:   Pertinent items are noted in HPI Denies abnormal vaginal discharge w/ itching/odor/irritation, headaches, visual changes, shortness of breath, chest pain, abdominal pain, severe nausea/vomiting, or problems with urination or bowel movements unless otherwise stated above. Pertinent History Reviewed:  Reviewed past medical,surgical, social, obstetrical and family history.  Reviewed problem list, medications and allergies. Physical Assessment:   Vitals:   07/03/19 0907  BP: 114/73  Pulse: 94  Weight: (!) 344 lb 4.8 oz (156.2 kg)  Body mass index is 50.84 kg/m.           Physical Examination:   General appearance: alert, well appearing, and in no distress  Mental status: alert, oriented to person, place, and time, normal mood, behavior, speech, dress, motor activity, and thought processes  Skin: warm & dry   Extremities: Edema: Trace    Cardiovascular: normal heart rate noted  Respiratory: normal respiratory effort, no distress  Abdomen: gravid, soft, non-tender  Pelvic: Cervical  exam deferred         Fetal Status: Fetal Heart Rate (bpm): 150 Fundal Height: 40 cm Movement: Present    Fetal Surveillance Testing today: FHT   Chaperone: Nikki Dom    Results for orders placed or performed in visit on 07/03/19 (from the past 24 hour(s))  POC Urinalysis Dipstick OB   Collection Time: 07/03/19  9:18 AM  Result Value Ref Range   Color, UA     Clarity, UA     Glucose, UA Negative Negative   Bilirubin, UA     Ketones, UA neg    Spec Grav, UA     Blood, UA neg    pH, UA     POC,PROTEIN,UA Negative Negative, Trace, Small (1+), Moderate (2+), Large (3+), 4+   Urobilinogen, UA     Nitrite, UA neg    Leukocytes, UA Negative Negative   Appearance     Odor      Assessment & Plan:  1) High-risk pregnancy [name] at [redacted]w[redacted]d with an Estimated Date of Delivery: 08/11/19   2) Diabetes Mellitus A1DM, stable  3) Lymphademia, stable  4) EtOH, no longer using  Meds: No orders of the defined types were placed in this encounter.  Labs/procedures today: FHT  Treatment Plan: Continue routine obstetric care.  Reviewed: Preterm labor symptoms and general obstetric precautions including but not limited to vaginal bleeding, contractions, leaking of fluid and fetal movement were reviewed in detail with the patient.  All questions were answered. Patient does not have a blood pressure cuff at home because her Medicaid would not pay for it. Trying to work on getting  around this. Check bp weekly, let us know if >140/90.  Follow-up: begun om Metformin 500 bid , so will begin NST's.next week.  Orders Placed This Encounter  Procedures  . POC Urinalysis Dipstick OB   By signing my name below, I, Clerance Lav, attest that this documentation has been prepared under the direction and in the presence of Jonnie Kind, MD. Electronically Signed: [place]. 07/03/19. 10:35 AM.  I personally performed the services described in this documentation, which was SCRIBED in  my presence. The recorded information has been reviewed and considered accurate. It has been edited as necessary during review. Jonnie Kind, MD

## 2019-07-03 NOTE — Patient Instructions (Signed)
[phone] is the phone number for Pregnancy Classes or hospital tours at [facility].   You will be referred to  [email or web address]   for more information on childbirth classes   At this site you may register for classes. You may sign up for a waiting list if classes are full. Please SIGN UP FOR THIS!.   When the waiting list becomes long, sometimes new classes can be added.  [facility] Call to Register: [phone] or [phone]   or   Register Online: [email or web address] THESE CLASSES FILL UP VERY QUICKLY, SO SIGN UP AS SOON AS YOU CAN!!! Please visit [facility] pregnancy website at [email or web address]  Childbirth Classes  Option 1: Birth & Baby Series ? Series of 3 weekly classes, on the same day of the week (can choose Mon-Thurs) from 6-9pm ? Helps you and your support person prepare for childbirth ? Reviews newborn care, labor & birth, cesarean birth, pain management, and comfort techniques ? Cost: $60 per couple for insured or self-pay, $30 per couple for Medicaid  Option 2: Weekend Birth & Baby ? This class is a weekend version of our Birth & Baby series.  It is designed for parents who have a difficult time fitting several weeks of classes into their schedule.   ? Covers the care of your newborn and the basics of labor and childbirth ? Friday 6:30pm-8:30pm Saturday 9am-4pm, includes lunch for you and your partner  ? Cost: $75 per couple for insured or self-pay, $30 per couple for Medicaid  Option 3: Natural Childbirth ? This series of 5 weekly classes is for expectant parents who want to learn and practice natural methods of coping with the process of labor and childbirth.  Can choose Mon or Tues, 7-9pm.   ? Covers relaxation, breathing, massage, visualization, role of the partner, and helpful positioning ? Participants learn how to be confident  in their body's ability to give birth. Class empowers and helps parents make informed decisions about care. Includes discussion that will help new parents transition into the immediate postpartum period.  ? Cost: $75 per couple for insured or self-pay, $30 per couple for Medicaid  Option 4: Online Birth & Baby ? This online class offers you the freedom to complete a Birth & Baby series in the comfort of your own home.  The flexibility of this option allows you to review sections at your own pace, at times convenient to you and your support people.  It includes additional video information, animations, quizzes and extended activities. Get organized with helpful eClass tools, checklists, and trackers.  ? Cost: $60 for 60 days of online access                                                                            Other Available Classes  Baby & Me Enjoy this time to discuss newborn & infant parenting topics and family adjustment issues with other new mothers in a relaxed environment. Each week brings a new speaker or baby-centered activity. We encourage mothers and their babies (birth to crawling) to join us. You are welcome to visit this group even if you haven't delivered yet! It's wonderful to make new friends early   and watch other moms interact with their babies. No registration or fee.  Big Brother/Big Sister Let your children share in the joy of a new brother or sister in this special class designed just for them. Discussion includes how families care for babies: swaddling, holding, diapering, safety, as well as how they can be helpful in their new role. This class is designed for children ages 2 to 6, but any age is welcome. Please register each child individually. $5 Breastfeeding Support Group This group is a mother-to-mother support circle where moms have the opportunity to share their breastfeeding experiences. A Breastfeeding Support nurse is present for questions and concerns. An infant  scale is available for weight checks. No fee or registration.  Breastfeeding Your Baby Breastfeeding is a special time for mother and child. This class will help you feel ready to begin this important relationship. Your partner is encouraged to attend with you. Learn what to expect and feel more confident in the first days of breastfeeding your newborn. This class also addresses the most common fears and challenges of breastfeeding during the first few weeks, months, and beyond. $30 per couple Caring for Baby This class is for expectant and adoptive parents who want to learn and practice the most up-to-date newborn care for their babies. Focus is on birth through first six weeks of life. Topics include feeding, bathing, diapering, crying, umbilical cord care, circumcision care and safe sleep. Parents learn how to recognize symptoms of illness and when to call the pediatrician. Register only the mom-to-be and your partner can plan to come with you. (*Note: This class is included in the Birth & Baby series and the Weekend Birth & Baby classes.) $10 per couple Comfort Techniques & Tour This 2-hour interactive class is designed for those who either do not wish to take the Birth & Baby series or for those who prefer our online childbirth class, but don't want to miss the opportunity to learn and practice hands-on techniques. These skills can help relieve some of the discomfort of labor and encourage your baby to rotate toward the best position for birth. You and your partner will be able to try a variety of labor positions with birth balls and rebozos as well as practice breathing, relaxation, and visual techniques. $20 per couple Daddy Boot Camp This course offers Dads-to-be the tools and knowledge needed to feel confident on their journey to becoming new fathers. Experienced dads, who have been trained as coaches, teach dads-to-be how to hold, comfort, diapers, swaddle and play with their infant while being  able to support the new mom as well. $25 Grandparent Love Expecting a grandbaby? Learn about the latest infant care and safety recommendations and ways to support your own child as he or she transitions into the parenting role. $10 per person Infant and Child CPR Parents, grandparents, babysitters, and friends learn Cardio-Pulmonary Resuscitation skills for infants and children. You will also learn how to treat both conscious and unconscious choking infants and children. Register each participant individually. (Note: This Family & Friends program does not offer certification.) $20 per person Marvelous Multiples Expecting twins, triplets, or more? This free 2-hour class covers the differences in labor, birth, parenting, and breastfeeding issues that face multiples' parents.  [facility] Center Virtual Tour  Online virtual tour of the new [facility] [place]  Mom Talk This free mom-led group offers support and connection to mothers as they journey through the adjustments and struggles of that   sometimes overwhelming first year after the birth of a child. A member of our staff will be present to share resources and additional support if needed, as you care for yourself and baby. You are welcome to visit this group before you deliver! It's wonderful to meet new friends early and watch other moms interact with their babies.  Waterbirth Class Interested in a waterbirth? This free informational class will help you discover whether waterbirth is the right fit for you and is required if you are planning a waterbirth. Education about waterbirth itself, supplies you may need, and what you may need from your support team is included in this class. Partners are encouraged to come.    

## 2019-07-08 ENCOUNTER — Encounter: Payer: Self-pay | Admitting: Obstetrics & Gynecology

## 2019-07-08 ENCOUNTER — Other Ambulatory Visit: Payer: Self-pay

## 2019-07-08 ENCOUNTER — Ambulatory Visit (INDEPENDENT_AMBULATORY_CARE_PROVIDER_SITE_OTHER): Payer: Medicare Other | Admitting: Obstetrics & Gynecology

## 2019-07-08 VITALS — BP 126/86 | HR 90

## 2019-07-08 DIAGNOSIS — O24415 Gestational diabetes mellitus in pregnancy, controlled by oral hypoglycemic drugs: Secondary | ICD-10-CM

## 2019-07-08 DIAGNOSIS — Z3A35 35 weeks gestation of pregnancy: Secondary | ICD-10-CM | POA: Diagnosis not present

## 2019-07-08 DIAGNOSIS — O99313 Alcohol use complicating pregnancy, third trimester: Secondary | ICD-10-CM | POA: Diagnosis not present

## 2019-07-08 DIAGNOSIS — Z331 Pregnant state, incidental: Secondary | ICD-10-CM

## 2019-07-08 DIAGNOSIS — Z1389 Encounter for screening for other disorder: Secondary | ICD-10-CM | POA: Diagnosis not present

## 2019-07-08 DIAGNOSIS — O0990 Supervision of high risk pregnancy, unspecified, unspecified trimester: Secondary | ICD-10-CM

## 2019-07-08 DIAGNOSIS — O0993 Supervision of high risk pregnancy, unspecified, third trimester: Secondary | ICD-10-CM

## 2019-07-08 LAB — POCT URINALYSIS DIPSTICK OB
Blood, UA: NEGATIVE
Glucose, UA: NEGATIVE
Ketones, UA: NEGATIVE
Leukocytes, UA: NEGATIVE
Nitrite, UA: NEGATIVE
POC,PROTEIN,UA: NEGATIVE

## 2019-07-08 NOTE — Progress Notes (Signed)
HIGH-RISK PREGNANCY VISIT Patient name: [name] MRN [id number]  Date of birth: 19-Oct-1985 Chief Complaint:   High Risk Gestation (NST/ not taking Metformin makes her nausea/ room # 12)  History of Present Illness:   [name] is a 34 y.o. G66P0010 female at [redacted]w[redacted]d with an Estimated Date of Delivery: 08/11/19 being seen today for ongoing management of a high-risk pregnancy complicated by diabetes mellitus A2DM currently on metformin 500 BID but it is making her sick nausea and diarrhea.  Today she reports no complaints. Contractions: Irregular.  .  Movement: Present. denies leaking of fluid.  Review of Systems:   Pertinent items are noted in HPI Denies abnormal vaginal discharge w/ itching/odor/irritation, headaches, visual changes, shortness of breath, chest pain, abdominal pain, severe nausea/vomiting, or problems with urination or bowel movements unless otherwise stated above. Pertinent History Reviewed:  Reviewed past medical,surgical, social, obstetrical and family history.  Reviewed problem list, medications and allergies. Physical Assessment:   Vitals:   07/08/19 0917  BP: 126/86  Pulse: 90  There is no height or weight on file to calculate BMI.           Physical Examination:   General appearance: alert, well appearing, and in no distress  Mental status: alert, oriented to person, place, and time  Skin: warm & dry   Extremities: Edema: None    Cardiovascular: normal heart rate noted  Respiratory: normal respiratory effort, no distress  Abdomen: gravid, soft, non-tender  Pelvic: Cervical exam deferred         Fetal Status:     Movement: Present    Fetal Surveillance Testing today: Reactive NST  [name] is at [redacted]w[redacted]d Estimated Date of Delivery: 08/11/19  NST being performed due to Class A2 DM  Today the NST is Reactive  Fetal Monitoring:  Baseline: 140 bpm, Variability: Good {> 6 bpm), Accelerations: Reactive and Decelerations: Absent    reactive  The accelerations are >15 bpm and more than 2 in 20 minutes  Final diagnosis:  Reactive NST  Lazaro Arms, MD      Chaperone: n/a    Results for orders placed or performed in visit on 07/08/19 (from the past 24 hour(s))  POC Urinalysis Dipstick OB   Collection Time: 07/08/19  9:26 AM  Result Value Ref Range   Color, UA     Clarity, UA     Glucose, UA Negative Negative   Bilirubin, UA     Ketones, UA neg    Spec Grav, UA     Blood, UA neg    pH, UA     POC,PROTEIN,UA Negative Negative, Trace, Small (1+), Moderate (2+), Large (3+), 4+   Urobilinogen, UA     Nitrite, UA neg    Leukocytes, UA Negative Negative   Appearance     Odor      Assessment & Plan:  1) High-risk pregnancy [name] at [redacted]w[redacted]d with an Estimated Date of Delivery: 08/11/19   2) Class A2 DM, stable, CBG decent, switching to glyburide 5 BID    Meds: No orders of the defined types were placed in this encounter.   Labs/procedures today: NST  Treatment Plan:  Twice weekly NST, EFW 1 week  Reviewed: Preterm labor symptoms and general obstetric precautions including but not limited to vaginal bleeding, contractions, leaking of fluid and fetal movement were reviewed in detail with the patient.  All questions were answered. Has home bp cuff. Rx faxed to . Check bp weekly, let  us know if >140/90.   Follow-up: Return in about 3 days (around 07/11/2019) for NST, HROB.  Orders Placed This Encounter  Procedures  . POC Urinalysis Dipstick OB   Mertie Clause Lakin Romer  07/08/2019 10:02 AM

## 2019-07-10 ENCOUNTER — Other Ambulatory Visit: Payer: Self-pay | Admitting: Women's Health

## 2019-07-10 ENCOUNTER — Other Ambulatory Visit: Payer: Self-pay | Admitting: *Deleted

## 2019-07-10 MED ORDER — GLYBURIDE 5 MG PO TABS: 5.0000 mg | ORAL_TABLET | Freq: Two times a day (BID) | ORAL | 6 refills | Status: DC

## 2019-07-11 ENCOUNTER — Other Ambulatory Visit: Payer: Self-pay

## 2019-07-11 ENCOUNTER — Other Ambulatory Visit: Payer: Self-pay | Admitting: *Deleted

## 2019-07-11 ENCOUNTER — Ambulatory Visit (INDEPENDENT_AMBULATORY_CARE_PROVIDER_SITE_OTHER): Payer: Medicare Other | Admitting: *Deleted

## 2019-07-11 VITALS — BP 132/85 | HR 106 | Wt 351.0 lb

## 2019-07-11 DIAGNOSIS — Z3A35 35 weeks gestation of pregnancy: Secondary | ICD-10-CM

## 2019-07-11 DIAGNOSIS — O288 Other abnormal findings on antenatal screening of mother: Secondary | ICD-10-CM

## 2019-07-11 DIAGNOSIS — Z331 Pregnant state, incidental: Secondary | ICD-10-CM

## 2019-07-11 DIAGNOSIS — O24415 Gestational diabetes mellitus in pregnancy, controlled by oral hypoglycemic drugs: Secondary | ICD-10-CM | POA: Diagnosis not present

## 2019-07-11 DIAGNOSIS — O0993 Supervision of high risk pregnancy, unspecified, third trimester: Secondary | ICD-10-CM | POA: Diagnosis not present

## 2019-07-11 DIAGNOSIS — Z1389 Encounter for screening for other disorder: Secondary | ICD-10-CM

## 2019-07-11 DIAGNOSIS — O0990 Supervision of high risk pregnancy, unspecified, unspecified trimester: Secondary | ICD-10-CM

## 2019-07-11 DIAGNOSIS — O99313 Alcohol use complicating pregnancy, third trimester: Secondary | ICD-10-CM

## 2019-07-11 LAB — POCT URINALYSIS DIPSTICK OB
Blood, UA: NEGATIVE
Glucose, UA: NEGATIVE
Ketones, UA: NEGATIVE
Leukocytes, UA: NEGATIVE
Nitrite, UA: NEGATIVE
POC,PROTEIN,UA: NEGATIVE

## 2019-07-11 MED ORDER — BLOOD PRESSURE MONITOR MISC: 0 refills | Status: DC

## 2019-07-11 NOTE — Progress Notes (Signed)
   NURSE VISIT- NST  SUBJECTIVE:  [name] is a 34 y.o. [name] female at [redacted]w[redacted]d, here for a NST for pregnancy complicated by A2DM currently on glyburide.  She reports active fetal movement, contractions: none, vaginal bleeding: none, membranes: intact.   OBJECTIVE:  BP 132/85   Pulse (!) 106   Wt (!) 351 lb (159.2 kg)   LMP  (LMP Unknown)   BMI 51.83 kg/m   Appears well, no apparent distress  Results for orders placed or performed in visit on 07/11/19 (from the past 24 hour(s))  POC Urinalysis Dipstick OB   Collection Time: 07/11/19  9:49 AM  Result Value Ref Range   Color, UA     Clarity, UA     Glucose, UA Negative Negative   Bilirubin, UA     Ketones, UA neg    Spec Grav, UA     Blood, UA neg    pH, UA     POC,PROTEIN,UA Negative Negative, Trace, Small (1+), Moderate (2+), Large (3+), 4+   Urobilinogen, UA     Nitrite, UA neg    Leukocytes, UA Negative Negative   Appearance     Odor      NST: FHR baseline 135 bpm, Variability: moderate, Accelerations:present, Decelerations:  Absent= Cat 1 /Reactive Toco: none   ASSESSMENT: [name] at [redacted]w[redacted]d with A2DM currently on glyburide NST reactive  PLAN: EFM strip reviewed by Cathie Beams, CNM   Recommendations: keep next appointment as scheduled    [name]  07/11/2019 10:54 AM

## 2019-07-12 ENCOUNTER — Other Ambulatory Visit: Payer: Self-pay | Admitting: Obstetrics & Gynecology

## 2019-07-15 ENCOUNTER — Other Ambulatory Visit: Payer: Medicare Other | Admitting: Women's Health

## 2019-07-17 ENCOUNTER — Other Ambulatory Visit: Payer: Medicare Other | Admitting: Obstetrics and Gynecology

## 2019-07-18 ENCOUNTER — Encounter (HOSPITAL_COMMUNITY): Payer: Self-pay | Admitting: *Deleted

## 2019-07-18 ENCOUNTER — Ambulatory Visit (INDEPENDENT_AMBULATORY_CARE_PROVIDER_SITE_OTHER): Payer: Medicare Other | Admitting: *Deleted

## 2019-07-18 ENCOUNTER — Other Ambulatory Visit: Payer: Self-pay

## 2019-07-18 ENCOUNTER — Emergency Department (HOSPITAL_COMMUNITY)
Admission: EM | Admit: 2019-07-18 | Discharge: 2019-07-18 | Disposition: A | Discharge status: Home / Self Care | Payer: Medicare Other | Attending: Emergency Medicine | Admitting: Emergency Medicine

## 2019-07-18 VITALS — BP 103/55 | HR 101 | Wt 350.2 lb

## 2019-07-18 DIAGNOSIS — O24415 Gestational diabetes mellitus in pregnancy, controlled by oral hypoglycemic drugs: Secondary | ICD-10-CM

## 2019-07-18 DIAGNOSIS — O2243 Hemorrhoids in pregnancy, third trimester: Secondary | ICD-10-CM | POA: Insufficient documentation

## 2019-07-18 DIAGNOSIS — Z87891 Personal history of nicotine dependence: Secondary | ICD-10-CM | POA: Diagnosis not present

## 2019-07-18 DIAGNOSIS — Z79899 Other long term (current) drug therapy: Secondary | ICD-10-CM | POA: Insufficient documentation

## 2019-07-18 DIAGNOSIS — O288 Other abnormal findings on antenatal screening of mother: Secondary | ICD-10-CM

## 2019-07-18 DIAGNOSIS — I890 Lymphedema, not elsewhere classified: Secondary | ICD-10-CM | POA: Insufficient documentation

## 2019-07-18 DIAGNOSIS — O99313 Alcohol use complicating pregnancy, third trimester: Secondary | ICD-10-CM

## 2019-07-18 DIAGNOSIS — K649 Unspecified hemorrhoids: Secondary | ICD-10-CM

## 2019-07-18 DIAGNOSIS — Z1389 Encounter for screening for other disorder: Secondary | ICD-10-CM

## 2019-07-18 DIAGNOSIS — Z3A36 36 weeks gestation of pregnancy: Secondary | ICD-10-CM

## 2019-07-18 DIAGNOSIS — O99613 Diseases of the digestive system complicating pregnancy, third trimester: Secondary | ICD-10-CM | POA: Diagnosis not present

## 2019-07-18 DIAGNOSIS — O0990 Supervision of high risk pregnancy, unspecified, unspecified trimester: Secondary | ICD-10-CM

## 2019-07-18 DIAGNOSIS — Z331 Pregnant state, incidental: Secondary | ICD-10-CM

## 2019-07-18 LAB — POCT URINALYSIS DIPSTICK OB
Blood, UA: NEGATIVE
Glucose, UA: NEGATIVE
Ketones, UA: NEGATIVE
Leukocytes, UA: NEGATIVE
Nitrite, UA: NEGATIVE
POC,PROTEIN,UA: NEGATIVE

## 2019-07-18 MED ORDER — HYDROCORTISONE ACETATE 25 MG RE SUPP: 25.0000 mg | Freq: Two times a day (BID) | RECTAL | 0 refills | Status: DC

## 2019-07-18 NOTE — ED Triage Notes (Addendum)
Pt with hemorrhoid pain for 2 weeks, has tried many OTC medications for hemorrhoids without relief.  Pt believes she is [redacted] weeks pregnant. Saw Dr. Emelda Fear earlier today.  LBM 3/10 per pt.  According to [facility] notes pt is 12 weeks4days

## 2019-07-18 NOTE — ED Notes (Signed)
Pt reports difficulty having BM. Pt reports taking OTC laxatives at home without success.

## 2019-07-18 NOTE — Progress Notes (Signed)
   NURSE VISIT- NST  SUBJECTIVE:  [name] is a 34 y.o. [name] female at [redacted]w[redacted]d, here for a NST for pregnancy complicated by A2DM currently on Metfromin.  She reports active fetal movement, contractions: none, vaginal bleeding: none, membranes: intact.   OBJECTIVE:  BP (!) 103/55   Pulse (!) 101   Wt (!) 350 lb 3.2 oz (158.8 kg)   LMP  (LMP Unknown)   BMI 51.72 kg/m   Appears well, no apparent distress  Results for orders placed or performed in visit on 07/18/19 (from the past 24 hour(s))  POC Urinalysis Dipstick OB   Collection Time: 07/18/19  9:40 AM  Result Value Ref Range   Color, UA     Clarity, UA     Glucose, UA Negative Negative   Bilirubin, UA     Ketones, UA neg    Spec Grav, UA     Blood, UA neg    pH, UA     POC,PROTEIN,UA Negative Negative, Trace, Small (1+), Moderate (2+), Large (3+), 4+   Urobilinogen, UA     Nitrite, UA neg    Leukocytes, UA Negative Negative   Appearance     Odor      NST: FHR baseline 130 bpm, Variability: moderate, Accelerations:present, Decelerations:  Absent= Cat 1/Reactive Toco: none, UI   ASSESSMENT: [name] at [redacted]w[redacted]d with A2DM currently on Metformin NST reactive  PLAN: EFM strip reviewed by Dr. Despina Hidden   Recommendations: keep next appointment as scheduled    [name]  07/18/2019 10:01 AM

## 2019-07-18 NOTE — ED Notes (Signed)
ED Provider at bedside. 
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
## 2019-07-22 ENCOUNTER — Other Ambulatory Visit: Payer: Medicare Other | Admitting: Obstetrics & Gynecology

## 2019-07-25 ENCOUNTER — Encounter: Payer: Self-pay | Admitting: *Deleted

## 2019-07-25 ENCOUNTER — Ambulatory Visit (INDEPENDENT_AMBULATORY_CARE_PROVIDER_SITE_OTHER): Payer: Medicare Other | Admitting: *Deleted

## 2019-07-25 ENCOUNTER — Other Ambulatory Visit: Payer: Self-pay

## 2019-07-25 VITALS — BP 114/73 | HR 92 | Wt 355.4 lb

## 2019-07-25 DIAGNOSIS — O99313 Alcohol use complicating pregnancy, third trimester: Secondary | ICD-10-CM

## 2019-07-25 DIAGNOSIS — Z3A37 37 weeks gestation of pregnancy: Secondary | ICD-10-CM | POA: Diagnosis not present

## 2019-07-25 DIAGNOSIS — Z1389 Encounter for screening for other disorder: Secondary | ICD-10-CM

## 2019-07-25 DIAGNOSIS — O0990 Supervision of high risk pregnancy, unspecified, unspecified trimester: Secondary | ICD-10-CM

## 2019-07-25 DIAGNOSIS — O24415 Gestational diabetes mellitus in pregnancy, controlled by oral hypoglycemic drugs: Secondary | ICD-10-CM | POA: Diagnosis not present

## 2019-07-25 DIAGNOSIS — O0993 Supervision of high risk pregnancy, unspecified, third trimester: Secondary | ICD-10-CM | POA: Diagnosis not present

## 2019-07-25 DIAGNOSIS — Z331 Pregnant state, incidental: Secondary | ICD-10-CM

## 2019-07-25 LAB — POCT URINALYSIS DIPSTICK OB
Blood, UA: NEGATIVE
Glucose, UA: NEGATIVE
Ketones, UA: NEGATIVE
Leukocytes, UA: NEGATIVE
Nitrite, UA: NEGATIVE
POC,PROTEIN,UA: NEGATIVE

## 2019-07-25 NOTE — Progress Notes (Signed)
   NURSE VISIT- NST  SUBJECTIVE:  [name] is a 34 y.o. [name] female at [redacted]w[redacted]d, here for a NST for pregnancy complicated by A2DM currently on Metformin.  She reports active fetal movement, contractions: none, vaginal bleeding: none, membranes: intact.   OBJECTIVE:  LMP  (LMP Unknown)   Appears well, no apparent distress  No results found for this or any previous visit (from the past 24 hour(s)).  NST: FHR baseline 130 bpm, Variability: moderate, Accelerations:present, Decelerations:  Absent= Cat 1/Reactive Toco: none   ASSESSMENT: [name] at [redacted]w[redacted]d with A2DM currently on Meformin NST reactive  PLAN: EFM strip reviewed by Dr. Emelda Fear   Recommendations: keep next appointment as scheduled    [name]  07/25/2019 10:09 AM

## 2019-07-29 ENCOUNTER — Encounter (HOSPITAL_COMMUNITY): Payer: Self-pay | Admitting: *Deleted

## 2019-07-29 ENCOUNTER — Encounter: Payer: Self-pay | Admitting: Obstetrics and Gynecology

## 2019-07-29 ENCOUNTER — Ambulatory Visit (INDEPENDENT_AMBULATORY_CARE_PROVIDER_SITE_OTHER): Payer: Medicare Other | Admitting: Obstetrics and Gynecology

## 2019-07-29 ENCOUNTER — Telehealth (HOSPITAL_COMMUNITY): Payer: Self-pay | Admitting: *Deleted

## 2019-07-29 ENCOUNTER — Other Ambulatory Visit: Payer: Self-pay

## 2019-07-29 VITALS — BP 106/60 | HR 94 | Wt 360.2 lb

## 2019-07-29 DIAGNOSIS — O0990 Supervision of high risk pregnancy, unspecified, unspecified trimester: Secondary | ICD-10-CM

## 2019-07-29 DIAGNOSIS — Z1389 Encounter for screening for other disorder: Secondary | ICD-10-CM

## 2019-07-29 DIAGNOSIS — Z3A38 38 weeks gestation of pregnancy: Secondary | ICD-10-CM | POA: Diagnosis not present

## 2019-07-29 DIAGNOSIS — O24415 Gestational diabetes mellitus in pregnancy, controlled by oral hypoglycemic drugs: Secondary | ICD-10-CM | POA: Diagnosis not present

## 2019-07-29 DIAGNOSIS — Z331 Pregnant state, incidental: Secondary | ICD-10-CM

## 2019-07-29 DIAGNOSIS — O99313 Alcohol use complicating pregnancy, third trimester: Secondary | ICD-10-CM

## 2019-07-29 LAB — POCT URINALYSIS DIPSTICK OB
Blood, UA: NEGATIVE
Glucose, UA: NEGATIVE
Ketones, UA: NEGATIVE
Leukocytes, UA: NEGATIVE
Nitrite, UA: NEGATIVE
POC,PROTEIN,UA: NEGATIVE

## 2019-07-29 NOTE — Telephone Encounter (Signed)
Preadmission screen  

## 2019-07-29 NOTE — Progress Notes (Signed)
Patient ID: [name], female   DOB: August 16, 1985, 34 y.o.   MRN: [id number]    hrob WITH  NST  SUBJECTIVE:  [name] is a 34 y.o. [name] female at [redacted]w[redacted]d, here for a NST for pregnancy complicated by A2DM currently on glyBURIDE 5mg  BID.  She reports active fetal movement, contractions: none, vaginal bleeding: none, membranes: intact.  The patient did not get her 36-week ultrasound for estimated fetal weight nor her group B strep, missing those appointments due to a family member, (mother) undergoing surgery and the patient was  being supportive, and involved in the family members care postop OBJECTIVE:    Appears well, no apparent distress  BP 106/60   Pulse 94   Wt (!) 360 lb 3.2 oz (163.4 kg)   LMP  (LMP Unknown)   BMI 53.19 kg/m    NST: FHR 130 baseline 130 bpm, Variability: moderate, Accelerations:present, Decelerations:  Absent= Cat 1/Reactive Toco: none   ASSESSMENT: [name] at [redacted]w[redacted]d with A2DM currently on Glyburide 5 mg twice daily NST reactive  PLAN: EFM strip reviewed by Dr. [redacted]w[redacted]d   Recommendations: keep next appointment as scheduled, in 3 days with estimated fetal weight to be ordered as well as GBS and GC chlamydia to be collected Group B Strip at next appointment.  Work in for  Emelda Fear for estimated fetal weight.  Later this week, Thursday 2 PM Induction of labor scheduled for Sunday morning early By signing my name below, I, Friday, attest that this documentation has been prepared under the direction and in the presence of [employer], MD. Electronically Signed: Tilda Burrow Medical Scribe. 07/29/19. 8:33 AM. .I personally performed the services described in this documentation, which was SCRIBED in my presence. The recorded information has been reviewed and considered accurate. It has been edited as necessary during review. 09/28/19, MD    130 accellerations 150

## 2019-07-31 ENCOUNTER — Other Ambulatory Visit: Payer: Self-pay | Admitting: Obstetrics and Gynecology

## 2019-07-31 ENCOUNTER — Other Ambulatory Visit: Payer: Self-pay | Admitting: Family Medicine

## 2019-07-31 DIAGNOSIS — O24419 Gestational diabetes mellitus in pregnancy, unspecified control: Secondary | ICD-10-CM

## 2019-07-31 DIAGNOSIS — O0990 Supervision of high risk pregnancy, unspecified, unspecified trimester: Secondary | ICD-10-CM

## 2019-08-01 ENCOUNTER — Other Ambulatory Visit: Payer: Self-pay | Admitting: Obstetrics and Gynecology

## 2019-08-01 ENCOUNTER — Ambulatory Visit (INDEPENDENT_AMBULATORY_CARE_PROVIDER_SITE_OTHER): Payer: Medicare Other

## 2019-08-01 ENCOUNTER — Other Ambulatory Visit: Payer: Medicare Other

## 2019-08-01 ENCOUNTER — Ambulatory Visit (INDEPENDENT_AMBULATORY_CARE_PROVIDER_SITE_OTHER): Payer: Medicare Other | Admitting: Obstetrics and Gynecology

## 2019-08-01 ENCOUNTER — Other Ambulatory Visit: Payer: Self-pay

## 2019-08-01 VITALS — BP 143/89 | HR 100 | Wt 361.2 lb

## 2019-08-01 DIAGNOSIS — O24419 Gestational diabetes mellitus in pregnancy, unspecified control: Secondary | ICD-10-CM | POA: Diagnosis not present

## 2019-08-01 DIAGNOSIS — Z3A38 38 weeks gestation of pregnancy: Secondary | ICD-10-CM

## 2019-08-01 DIAGNOSIS — O24113 Pre-existing diabetes mellitus, type 2, in pregnancy, third trimester: Secondary | ICD-10-CM

## 2019-08-01 DIAGNOSIS — E119 Type 2 diabetes mellitus without complications: Secondary | ICD-10-CM

## 2019-08-01 DIAGNOSIS — Z331 Pregnant state, incidental: Secondary | ICD-10-CM

## 2019-08-01 DIAGNOSIS — Z1389 Encounter for screening for other disorder: Secondary | ICD-10-CM

## 2019-08-01 LAB — POCT URINALYSIS DIPSTICK OB
Blood, UA: NEGATIVE
Glucose, UA: NEGATIVE
Ketones, UA: NEGATIVE
Leukocytes, UA: NEGATIVE
Nitrite, UA: NEGATIVE
POC,PROTEIN,UA: NEGATIVE

## 2019-08-01 NOTE — Progress Notes (Signed)
Patient ID: [name], female   DOB: 1986-01-11, 34 y.o.   MRN: [id number]    [facility] PREGNANCY VISIT Patient name: [name] MRN [id number]  Date of birth: 01-Jul-1985 Chief Complaint:   Routine Prenatal Visit (Ultrasound)  History of Present Illness:   [name] is a 34 y.o. G11P0010 female at [redacted]w[redacted]d with an Estimated Date of Delivery: 08/11/19 being seen today for ongoing management of a high-risk pregnancy complicated by diabetes mellitus A2DM currently on Metformin.  Today she reports no complaints. IOL scheduled for 4/ 11  08/01/2019: [place] 38+2 wks, cephalic, posterior placenta gr 3, BPP 8/8, FHR 166 BPM, EFW 3146 g 32%, limited view   She is scheduled for induction on 08/04/2019.   Depression screen [facility] 2/9 06/12/2019 09/18/2018 08/28/2018 08/01/2017 07/13/2017  Decreased Interest 0 0 0 0 2  Down, Depressed, Hopeless 0 0 0 0 3  PHQ - 2 Score 0 0 0 0 5  Altered sleeping 2 - - - 3  Tired, decreased energy 1 - - - 3  Change in appetite 0 - - - 3  Feeling bad or failure about yourself  0 - - - 3  Trouble concentrating 0 - - - 2  Moving slowly or fidgety/restless 0 - - - 2  Suicidal thoughts 0 - - - 3  PHQ-9 Score 3 - - - 24  Difficult doing work/chores - - - - Very difficult  Some recent data might be hidden   Contractions: Irregular. Vag. Bleeding: None.  Movement: Present. denies leaking of fluid.   Review of Systems:   Pertinent items are noted in HPI Denies abnormal vaginal discharge w/ itching/odor/irritation, headaches, visual changes, shortness of breath, chest pain, abdominal pain, severe nausea/vomiting, or problems with urination or bowel movements unless otherwise stated above.  Pertinent History Reviewed:  Reviewed past medical,surgical, social, obstetrical and family history.  Reviewed problem list, medications and allergies.  Physical Assessment:   Vitals:   08/01/19 1538  BP: (!) 143/89  Pulse: 100  Weight: (!) 361 lb 3.2 oz (163.8 kg)    Body mass index is 53.34 kg/m.           Physical Examination:   General appearance: alert, well appearing, and in no distress, oriented to person, place, and time, overweight and well hydrated  Mental status: alert, oriented to person, place, and time, normal mood, behavior, speech, dress, motor activity, and thought processes, affect appropriate to mood  Skin: warm & dry   Extremities: Edema: Trace    Cardiovascular: normal heart rate noted  Respiratory: normal respiratory effort, no distress  Abdomen: gravid, soft, non-tender  Pelvic: Cervical exam deferred         Fetal Status:     Movement: Present    Fetal Surveillance Testing today: BPP   Chaperone: Biomedical scientist    Results for orders placed or performed in visit on 08/01/19 (from the past 24 hour(s))  POC Urinalysis Dipstick OB   Collection Time: 08/01/19  3:37 PM  Result Value Ref Range   Color, UA     Clarity, UA     Glucose, UA Negative Negative   Bilirubin, UA     Ketones, UA n    Spec Grav, UA     Blood, UA n    pH, UA     POC,PROTEIN,UA Negative Negative, Trace, Small (1+), Moderate (2+), Large (3+), 4+   Urobilinogen, UA     Nitrite, UA n    Leukocytes, UA  Negative Negative   Appearance     Odor      Assessment & Plan:  1) High-risk pregnancy [name] at [redacted]w[redacted]d with an Estimated Date of Delivery: 08/11/19   2) Gest DMA2, stable  3) obesity, stable Body mass index is 53.34 kg/m.   Meds: No orders of the defined types were placed in this encounter.   Labs/procedures today: BPP  Treatment Plan:  IOL 4/11, Dr Deloris Ping orders placed gratefully recognized  Reviewed:  labor symptoms and general obstetric precautions including but not limited to vaginal bleeding, contractions, leaking of fluid and fetal movement were reviewed in detail with the patient.  All questions were answered told to anticipate a 2-3 day IOL  home bp cuff. . Check bp , let us know if >140/90.   Follow-up: No follow-ups on  file.  Orders Placed This Encounter  Procedures  . POC Urinalysis Dipstick OB    By signing my name below, I, [employer], attest that this documentation has been prepared under the direction and in the presence of Tilda Burrow, MD. Electronically Signed: Mal Misty Medical Scribe. 08/01/19. 4:08 PM.  I personally performed the services described in this documentation, which was SCRIBED in my presence. The recorded information has been reviewed and considered accurate. It has been edited as necessary during review. Tilda Burrow, MD

## 2019-08-01 NOTE — Progress Notes (Signed)
[place] 38+4 wks,cephalic,posterior placenta gr 3,BPP 8/8,FHR 166 BPM,EFW 3146 g 32%,limited view

## 2019-08-01 NOTE — Progress Notes (Signed)
Orders by C Fair , help appreciated.

## 2019-08-02 ENCOUNTER — Other Ambulatory Visit (HOSPITAL_COMMUNITY)
Admission: RE | Admit: 2019-08-02 | Discharge: 2019-08-02 | Disposition: A | Discharge status: Home / Self Care | Payer: Medicare Other | Source: Ambulatory Visit | Attending: Family Medicine | Admitting: Family Medicine

## 2019-08-02 ENCOUNTER — Other Ambulatory Visit (HOSPITAL_COMMUNITY): Payer: Medicare Other

## 2019-08-02 DIAGNOSIS — Z01812 Encounter for preprocedural laboratory examination: Secondary | ICD-10-CM | POA: Insufficient documentation

## 2019-08-02 DIAGNOSIS — Z20822 Contact with and (suspected) exposure to covid-19: Secondary | ICD-10-CM | POA: Insufficient documentation

## 2019-08-03 LAB — SARS CORONAVIRUS 2 (TAT 6-24 HRS): SARS Coronavirus 2: NEGATIVE

## 2019-08-04 ENCOUNTER — Other Ambulatory Visit: Payer: Self-pay

## 2019-08-04 ENCOUNTER — Inpatient Hospital Stay (HOSPITAL_COMMUNITY)
Admission: AD | Admit: 2019-08-04 | Discharge: 2019-08-08 | DRG: 805 | Disposition: A | Discharge status: Home / Self Care | Payer: Medicare Other | Attending: Family Medicine | Admitting: Family Medicine

## 2019-08-04 ENCOUNTER — Encounter (HOSPITAL_COMMUNITY): Payer: Self-pay | Admitting: Obstetrics & Gynecology

## 2019-08-04 ENCOUNTER — Inpatient Hospital Stay (HOSPITAL_COMMUNITY): Payer: Medicare Other

## 2019-08-04 DIAGNOSIS — Z3A39 39 weeks gestation of pregnancy: Secondary | ICD-10-CM | POA: Diagnosis not present

## 2019-08-04 DIAGNOSIS — O24415 Gestational diabetes mellitus in pregnancy, controlled by oral hypoglycemic drugs: Secondary | ICD-10-CM

## 2019-08-04 DIAGNOSIS — O99313 Alcohol use complicating pregnancy, third trimester: Secondary | ICD-10-CM

## 2019-08-04 DIAGNOSIS — O99214 Obesity complicating childbirth: Secondary | ICD-10-CM | POA: Diagnosis present

## 2019-08-04 DIAGNOSIS — Z20822 Contact with and (suspected) exposure to covid-19: Secondary | ICD-10-CM | POA: Diagnosis present

## 2019-08-04 DIAGNOSIS — O99413 Diseases of the circulatory system complicating pregnancy, third trimester: Secondary | ICD-10-CM | POA: Diagnosis present

## 2019-08-04 DIAGNOSIS — O133 Gestational [pregnancy-induced] hypertension without significant proteinuria, third trimester: Secondary | ICD-10-CM

## 2019-08-04 DIAGNOSIS — O0990 Supervision of high risk pregnancy, unspecified, unspecified trimester: Secondary | ICD-10-CM

## 2019-08-04 DIAGNOSIS — O0930 Supervision of pregnancy with insufficient antenatal care, unspecified trimester: Secondary | ICD-10-CM

## 2019-08-04 DIAGNOSIS — Z87891 Personal history of nicotine dependence: Secondary | ICD-10-CM

## 2019-08-04 DIAGNOSIS — O134 Gestational [pregnancy-induced] hypertension without significant proteinuria, complicating childbirth: Secondary | ICD-10-CM | POA: Diagnosis not present

## 2019-08-04 DIAGNOSIS — O24425 Gestational diabetes mellitus in childbirth, controlled by oral hypoglycemic drugs: Secondary | ICD-10-CM | POA: Diagnosis present

## 2019-08-04 DIAGNOSIS — O9942 Diseases of the circulatory system complicating childbirth: Secondary | ICD-10-CM | POA: Diagnosis present

## 2019-08-04 DIAGNOSIS — Z880 Allergy status to penicillin: Secondary | ICD-10-CM | POA: Diagnosis not present

## 2019-08-04 DIAGNOSIS — O24419 Gestational diabetes mellitus in pregnancy, unspecified control: Secondary | ICD-10-CM | POA: Diagnosis present

## 2019-08-04 DIAGNOSIS — Z8759 Personal history of other complications of pregnancy, childbirth and the puerperium: Secondary | ICD-10-CM

## 2019-08-04 DIAGNOSIS — Z3A36 36 weeks gestation of pregnancy: Secondary | ICD-10-CM | POA: Diagnosis not present

## 2019-08-04 DIAGNOSIS — O24420 Gestational diabetes mellitus in childbirth, diet controlled: Secondary | ICD-10-CM | POA: Diagnosis not present

## 2019-08-04 DIAGNOSIS — I890 Lymphedema, not elsewhere classified: Secondary | ICD-10-CM | POA: Diagnosis present

## 2019-08-04 LAB — CBC
HCT: 35.4 % — ABNORMAL LOW (ref 36.0–46.0)
Hemoglobin: 11.1 g/dL — ABNORMAL LOW (ref 12.0–15.0)
MCH: 26.4 pg (ref 26.0–34.0)
MCHC: 31.4 g/dL (ref 30.0–36.0)
MCV: 84.1 fL (ref 80.0–100.0)
Platelets: 231 10*3/uL (ref 150–400)
RBC: 4.21 MIL/uL (ref 3.87–5.11)
RDW: 16.1 % — ABNORMAL HIGH (ref 11.5–15.5)
WBC: 8.7 10*3/uL (ref 4.0–10.5)
nRBC: 0 % (ref 0.0–0.2)

## 2019-08-04 LAB — TYPE AND SCREEN
ABO/RH(D): B POS
Antibody Screen: NEGATIVE

## 2019-08-04 LAB — GLUCOSE, CAPILLARY
Glucose-Capillary: 88 mg/dL (ref 70–99)
Glucose-Capillary: 114 mg/dL — ABNORMAL HIGH (ref 70–99)
Glucose-Capillary: 110 mg/dL — ABNORMAL HIGH (ref 70–99)
Glucose-Capillary: 114 mg/dL — ABNORMAL HIGH (ref 70–99)

## 2019-08-04 LAB — GROUP B STREP BY PCR: Group B strep by PCR: NEGATIVE

## 2019-08-04 LAB — ABO/RH: ABO/RH(D): B POS

## 2019-08-04 LAB — RPR: RPR Ser Ql: NONREACTIVE

## 2019-08-04 MED ORDER — FENTANYL CITRATE (PF) 100 MCG/2ML IJ SOLN
100.0000 ug | INTRAMUSCULAR | Status: DC | PRN
  Administered 2019-08-04 – 2019-08-05 (×3): 100 ug via INTRAVENOUS
  Filled 2019-08-04 (×2): qty 2

## 2019-08-04 MED ORDER — LACTATED RINGERS IV SOLN
500.0000 mL | INTRAVENOUS | Status: DC | PRN
  Administered 2019-08-06: 1000 mL via INTRAVENOUS

## 2019-08-04 MED ORDER — FENTANYL CITRATE (PF) 100 MCG/2ML IJ SOLN
INTRAMUSCULAR | Status: AC
  Filled 2019-08-04: qty 2

## 2019-08-04 MED ORDER — LIDOCAINE HCL (PF) 1 % IJ SOLN: 30.0000 mL | INTRAMUSCULAR | Status: DC | PRN

## 2019-08-04 MED ORDER — LACTATED RINGERS IV SOLN: INTRAVENOUS | Status: DC

## 2019-08-04 MED ORDER — SOD CITRATE-CITRIC ACID 500-334 MG/5ML PO SOLN
30.0000 mL | ORAL | Status: DC | PRN
  Filled 2019-08-04: qty 30

## 2019-08-04 MED ORDER — OXYTOCIN BOLUS FROM INFUSION
500.0000 mL | Freq: Once | INTRAVENOUS | Status: AC
  Administered 2019-08-06: 500 mL via INTRAVENOUS

## 2019-08-04 MED ORDER — ACETAMINOPHEN 325 MG PO TABS: 650.0000 mg | ORAL_TABLET | ORAL | Status: DC | PRN

## 2019-08-04 MED ORDER — OXYTOCIN 40 UNITS IN NORMAL SALINE INFUSION - SIMPLE MED: 2.5000 [IU]/h | INTRAVENOUS | Status: DC

## 2019-08-04 MED ORDER — ONDANSETRON HCL 4 MG/2ML IJ SOLN: 4.0000 mg | Freq: Four times a day (QID) | INTRAMUSCULAR | Status: DC | PRN

## 2019-08-04 MED ORDER — MISOPROSTOL 50MCG HALF TABLET
50.0000 ug | ORAL_TABLET | ORAL | Status: DC | PRN
  Administered 2019-08-04 – 2019-08-05 (×5): 50 ug via BUCCAL
  Filled 2019-08-04 (×5): qty 1

## 2019-08-04 MED ORDER — TERBUTALINE SULFATE 1 MG/ML IJ SOLN
0.2500 mg | Freq: Once | INTRAMUSCULAR | Status: DC | PRN
  Filled 2019-08-04: qty 1
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
## 2019-08-04 NOTE — Progress Notes (Signed)
Labor Progress Note JENNELL JANOSIK is a 34 y.o. [name] at [redacted]w[redacted]d presented for IOL for A2GDM  S:  Patient reports feeling intermittent contractions and cramping  O:  BP 127/75   Pulse 80   Temp 98.4 F (36.9 C) (Oral)   Resp 16   Ht 5\' 9"  (1.753 m)   Wt (!) 166.9 kg   LMP  (LMP Unknown)   BMI 54.34 kg/m   Fetal Tracing:  Baseline: 130 Variability: moderate Accels: 15x15 Decels: none  Toco: 1-8    CVE: Dilation: Closed Effacement (%): Thick Cervical Position: Posterior Station: Ballotable Presentation: Vertex Exam by:: [email or web address] RN   A&P: 34 y.o. [name] [redacted]w[redacted]d IOL A2GDM #Labor: Continue cytotec. #Pain: per patient request #FWB: Cat 1 #GBS negative  [redacted]w[redacted]d, Rolm Bookbinder 08/04/19

## 2019-08-04 NOTE — Progress Notes (Signed)
Labor Progress Note [name] is a 34 y.o. [name] at [redacted]w[redacted]d presented for IOL for A2GDM S:  Patient not having any complaints.  Patient felt pressure and urge to urinate after placement of her foley bulb.   O:  BP 113/72   Pulse 83   Temp 98.5 F (36.9 C) (Oral)   Resp 18   Ht 5\' 9"  (1.753 m)   Wt (!) 166.9 kg   LMP  (LMP Unknown)   BMI 54.34 kg/m  EFM: pt was not on monitors as she just was in the shower.    CVE: Dilation: Fingertip Effacement (%): 50 Cervical Position: Posterior Station: -3 Presentation: Vertex Exam by:: chelsea fair, md   A&P: 34 y.o. [name] [redacted]w[redacted]d here for IOL for A2GDM.   #Labor: Progressing well. Cook's catheter was placed with 80cc of water. Pt tolerated procedure well.  Give 4th dose cytotec.  #Pain: per pt request. Not currently taking pain medication.  #FWB: not on monitor at the moment (just took shower) #GBS negative by PCR #A2GDM:  Last two glucose readings were 114>110. No insulin given.   [redacted]w[redacted]d, MD 9:25 PM

## 2019-08-04 NOTE — Progress Notes (Signed)
Labor Progress Note ALEAH AHLGRIM is a 34 y.o. [name] at [redacted]w[redacted]d presented for IOL for A2GDM  S:  Patient resting comfortably  O:  BP 126/66   Pulse 87   Temp 98.3 F (36.8 C) (Oral)   Resp 18   Ht 5\' 9"  (1.753 m)   Wt (!) 166.9 kg   LMP  (LMP Unknown)   BMI 54.34 kg/m   Fetal Tracing:  Baseline: 130 Variability: moderate Accels: 15x15 Decels: none  Toco: 2-10   CVE: Dilation: Fingertip Effacement (%): 50 Cervical Position: Posterior Station: -3 Presentation: Vertex Exam by:: [email or web address] CNM   A&P: 33 y.o. [name] [redacted]w[redacted]d IOL A2GDM #Labor: Progressing well. Continue cytotec #Pain: per patient request #FWB: Cat 1 #GBS negative   [redacted]w[redacted]d, CNM 4:41 PM

## 2019-08-05 ENCOUNTER — Encounter (HOSPITAL_COMMUNITY): Payer: Self-pay | Admitting: Obstetrics & Gynecology

## 2019-08-05 ENCOUNTER — Inpatient Hospital Stay (HOSPITAL_COMMUNITY): Payer: Medicare Other | Admitting: Anesthesiology

## 2019-08-05 LAB — COMPREHENSIVE METABOLIC PANEL
ALT: 12 U/L (ref 0–44)
AST: 15 U/L (ref 15–41)
Albumin: 2.4 g/dL — ABNORMAL LOW (ref 3.5–5.0)
Alkaline Phosphatase: 77 U/L (ref 38–126)
Anion gap: 9 (ref 5–15)
BUN: 6 mg/dL (ref 6–20)
CO2: 22 mmol/L (ref 22–32)
Calcium: 8.8 mg/dL — ABNORMAL LOW (ref 8.9–10.3)
Chloride: 107 mmol/L (ref 98–111)
Creatinine, Ser: 0.72 mg/dL (ref 0.44–1.00)
GFR calc Af Amer: >60 mL/min (ref >60)
GFR calc non Af Amer: >60 mL/min (ref >60)
Glucose, Bld: 83 mg/dL (ref 70–99)
Potassium: 4 mmol/L (ref 3.5–5.1)
Sodium: 138 mmol/L (ref 135–145)
Total Bilirubin: 0.3 mg/dL (ref 0.3–1.2)
Total Protein: 5.9 g/dL — ABNORMAL LOW (ref 6.5–8.1)

## 2019-08-05 LAB — GLUCOSE, CAPILLARY
Glucose-Capillary: 101 mg/dL — ABNORMAL HIGH (ref 70–99)
Glucose-Capillary: 57 mg/dL — ABNORMAL LOW (ref 70–99)
Glucose-Capillary: 64 mg/dL — ABNORMAL LOW (ref 70–99)
Glucose-Capillary: 70 mg/dL (ref 70–99)
Glucose-Capillary: 82 mg/dL (ref 70–99)
Glucose-Capillary: 90 mg/dL (ref 70–99)
Glucose-Capillary: 93 mg/dL (ref 70–99)

## 2019-08-05 LAB — CBC
HCT: 36.8 % (ref 36.0–46.0)
Hemoglobin: 11.5 g/dL — ABNORMAL LOW (ref 12.0–15.0)
MCH: 26.4 pg (ref 26.0–34.0)
MCHC: 31.3 g/dL (ref 30.0–36.0)
MCV: 84.6 fL (ref 80.0–100.0)
Platelets: 232 10*3/uL (ref 150–400)
RBC: 4.35 MIL/uL (ref 3.87–5.11)
RDW: 15.9 % — ABNORMAL HIGH (ref 11.5–15.5)
WBC: 10.7 10*3/uL — ABNORMAL HIGH (ref 4.0–10.5)
nRBC: 0 % (ref 0.0–0.2)

## 2019-08-05 LAB — PROTEIN / CREATININE RATIO, URINE
Creatinine, Urine: 72.05 mg/dL
Protein Creatinine Ratio: 0.11 mg/mg{Cre} (ref 0.00–0.15)
Total Protein, Urine: 8 mg/dL

## 2019-08-05 MED ORDER — EPHEDRINE 5 MG/ML INJ: 10.0000 mg | INTRAVENOUS | Status: DC | PRN

## 2019-08-05 MED ORDER — HYDROCORTISONE (PERIANAL) 2.5 % EX CREA
TOPICAL_CREAM | Freq: Three times a day (TID) | CUTANEOUS | Status: DC
  Filled 2019-08-05 (×2): qty 28.35

## 2019-08-05 MED ORDER — LACTATED RINGERS IV SOLN: 500.0000 mL | Freq: Once | INTRAVENOUS | Status: DC

## 2019-08-05 MED ORDER — LIDOCAINE-EPINEPHRINE (PF) 2 %-1:200000 IJ SOLN
INTRAMUSCULAR | Status: DC | PRN
  Administered 2019-08-05: 3 mL via EPIDURAL
  Administered 2019-08-05: 2 mL via EPIDURAL

## 2019-08-05 MED ORDER — PHENYLEPHRINE 40 MCG/ML (10ML) SYRINGE FOR IV PUSH (FOR BLOOD PRESSURE SUPPORT): 80.0000 ug | PREFILLED_SYRINGE | INTRAVENOUS | Status: DC | PRN

## 2019-08-05 MED ORDER — SODIUM CHLORIDE (PF) 0.9 % IJ SOLN
INTRAMUSCULAR | Status: DC | PRN
  Administered 2019-08-05: 12 mL/h via EPIDURAL

## 2019-08-05 MED ORDER — FENTANYL-BUPIVACAINE-NACL 0.5-0.125-0.9 MG/250ML-% EP SOLN
12.0000 mL/h | EPIDURAL | Status: DC | PRN
  Administered 2019-08-06: 12 mL/h via EPIDURAL
  Filled 2019-08-05 (×2): qty 250

## 2019-08-05 MED ORDER — DIPHENHYDRAMINE HCL 50 MG/ML IJ SOLN: 12.5000 mg | INTRAMUSCULAR | Status: DC | PRN

## 2019-08-05 MED ORDER — TERBUTALINE SULFATE 1 MG/ML IJ SOLN
0.2500 mg | Freq: Once | INTRAMUSCULAR | Status: AC | PRN
  Administered 2019-08-06: 0.25 mg via SUBCUTANEOUS

## 2019-08-05 MED ORDER — OXYTOCIN 40 UNITS IN NORMAL SALINE INFUSION - SIMPLE MED
1.0000 m[IU]/min | INTRAVENOUS | Status: DC
  Administered 2019-08-05 – 2019-08-06 (×2): 2 m[IU]/min via INTRAVENOUS
  Filled 2019-08-05 (×2): qty 1000

## 2019-08-05 NOTE — Progress Notes (Signed)
Labor Progress Note [name] is a 34 y.o. [name] at [redacted]w[redacted]d presented for IOL for GDMA2. S:  Overall comfortable. Feeling ctx more.  O:  BP 129/82   Pulse 79   Temp 98.2 F (36.8 C) (Oral)   Resp 18   Ht 5\' 9"  (1.753 m)   Wt (!) 166.9 kg   LMP  (LMP Unknown)   BMI 54.34 kg/m  EFM: 135, moderate variability, pos accels, no decels, reactive TOCO: q2-9m  CVE: Dilation: 4 Effacement (%): 70 Cervical Position: Posterior Station: -3 Presentation: Vertex Exam by:: [email or web address], RN   A&P: 34 y.o. [name] [redacted]w[redacted]d here for IOL for GDMA2. #Labor: Progressing well. S/p Cytotec x4 and Cooks catheter. Pit started. AROM PRN. Anticipate SVD.  #Pain: per pt request #FWB: cat I #GBS negative #GDMA2: last glucose 90 #Elevated BP's > 4 hours apart: Pre-E labs pending; asymptomatic; gHTN vs Pre-E  [redacted]w[redacted]d, MD 6:25 AM

## 2019-08-05 NOTE — Progress Notes (Signed)
Labor Progress Note [name] is a 34 y.o. [name] at [redacted]w[redacted]d presented for IOL for GDMA2.   S:  Patient comfortable with epidural.  O:  BP 132/74   Pulse 77   Temp 98.3 F (36.8 C) (Oral)   Resp 18   Ht 5\' 9"  (1.753 m)   Wt (!) 166.9 kg   LMP  (LMP Unknown)   SpO2 100%   BMI 54.34 kg/m  EFM: 125, moderate variability, pos accels, few variable decels, reactive TOCO: q3-94m  CVE: Dilation: 4.5 Effacement (%): 90 Cervical Position: Posterior Station: -3 Presentation: Vertex Exam by:: Zelphia Glover   A&P: 34 y.o. [name] [redacted]w[redacted]d admitted for IOL for GDMA2.  #Labor: S/p Cytotec x5, cooks catheter and AROM/IUPC. MVU's ~ 80. Cervix feels more thin with this exam. FSE placed as RN is unable to trace well with position changes. Baby feels asynclitic; attempted to elevated head and rotate. Anticipate SVD. #Pain: epidural  #FWB: Cat II; reassuring for moderate variability and accels  #GBS negative  #GDMA2: last glucose 64 #gHTN: Pre-E labs negative, normal range currently; asymptomatic   [redacted]w[redacted]d, MD 10:04 PM

## 2019-08-05 NOTE — Progress Notes (Signed)
Labor Progress Note [name] is a 34 y.o. [name] at [redacted]w[redacted]d presented for IOL for GDMA2.   S:  Patient comfortable s/p epidural.     O:  BP 118/69   Pulse 83   Temp 97.9 F (36.6 C) (Oral)   Resp 18   Ht 5\' 9"  (1.753 m)   Wt (!) 166.9 kg   LMP  (LMP Unknown)   SpO2 97%   BMI 54.34 kg/m  EFM: 125 / moderate variability / no accels or decels   CVE: Dilation: 4 Effacement (%): 70 Cervical Position: Anterior Station: -3 Presentation: Vertex Exam by:: Dr. [email or web address]   A&P: 34 y.o. [name] [redacted]w[redacted]d admitted for IOL for GDMA2.  #Labor: Progressing s/p Cytotec x5, cooks catheter.  AROM at 1756 with clear fluid. Continue pitocin. IUPC placed. Anticipate vaginal delivery.  #Pain: epidural  #FWB: Cat 1  #GBS negative  #GDMA2: recent CBG  70.  Continue to monitor.  #gHTN vs Pre-E:  Recent BP normal to mildly hypotensive. Pre-E labs were unremarkable.    [redacted]w[redacted]d, DO 6:40 PM

## 2019-08-05 NOTE — Progress Notes (Signed)
Labor Progress Note [name] is a 33 y.o. [name] at [redacted]w[redacted]d presented for IOL for A2GDM S:  Resting comfortably  O:  BP 120/88   Pulse 87   Temp 98.4 F (36.9 C) (Oral)   Resp 18   Ht 5\' 9"  (1.753 m)   Wt (!) 166.9 kg   LMP  (LMP Unknown)   BMI 54.34 kg/m  EFM: 135/mod variability/accels.  CVE: Dilation: Fingertip Effacement (%): 50 Cervical Position: Posterior Station: -3 Presentation: Vertex Exam by:: chelsea fair, md   A&P: 34 y.o. [name] 104w1d here for IOL for A2GDM #Labor: Progressing well. Foley bulb still in place.  5th cytotec given.   #Pain: per pt request #FWB: cat I #GBS negative #A2GDM:  Glucose 110>114>101.  No insulin given.   [redacted]w[redacted]d, MD 1:55 AM

## 2019-08-05 NOTE — Progress Notes (Signed)
Labor Progress Note [name] is a 34 y.o. [name] at [redacted]w[redacted]d presented for IOL for GDMA2.   S:  Patient comfortable with epidural and sleeping.   O:  BP 123/71   Pulse 89   Temp 97.9 F (36.6 C) (Oral)   Resp 18   Ht 5\' 9"  (1.753 m)   Wt (!) 166.9 kg   LMP  (LMP Unknown)   SpO2 100%   BMI 54.34 kg/m  EFM: 125, moderate variability, pos accels, few variable decels, reactive TOCO: q3-64m  CVE: Dilation: 4.5 Effacement (%): 70 Cervical Position: Anterior Station: -3 Presentation: Vertex Exam by:: Dr. [email or web address]   A&P: 34 y.o. [name] [redacted]w[redacted]d admitted for IOL for GDMA2.  #Labor: Progressing s/p Cytotec x5, cooks catheter and AROM. Will place internals at next exam if no progress. Cont Pit titration as able. Anticipate SVD. #Pain: epidural  #FWB: Cat II; reassuring for moderate variability and accels  #GBS negative  #GDMA2: last glucose 64 #gHTN: Pre-E labs negative, normal range currently; asymptomatic   [redacted]w[redacted]d, MD 8:31 PM

## 2019-08-05 NOTE — Progress Notes (Addendum)
Labor Progress Note   [name] is a 34 y.o. [name] at [redacted]w[redacted]d presented for IOL for GDMA2. S:   Pt comfortable and tolerating contractions.    O:  BP 117/62   Pulse 90   Temp 97.9 F (36.6 C) (Oral)   Resp 16   Ht 5\' 9"  (1.753 m)   Wt (!) 166.9 kg   LMP  (LMP Unknown)   SpO2 97%   BMI 54.34 kg/m  EFM: 135 / moderate variability /  Pos accels, no decels  TOCO: every 2-3 minutes   CVE: Dilation: 4 Effacement (%): 70 Station: -3 Presentation: Vertex Exam by:: [email or web address], RN   A&P: 34 y.o. [name] [redacted]w[redacted]d here for IOL for GDMA2. #Labor: Meet and introduced OB team to patient.  Patient doing well s/p Cytotec x4 and cooks catheter. Anticipate SVD.     #Pain: per patient request  #FWB: Cat 1  #GBS negative #GDMA2: glucose on AM CMP 83, continue to monitor  #gHTN vs Pre-E: Pr/Cr ratio 0.11, Plts 232, BP elevated 139/94; asymptomatic    [redacted]w[redacted]d, DO

## 2019-08-05 NOTE — Anesthesia Procedure Notes (Signed)
Epidural Patient location during procedure: OB Start time: 08/05/2019 12:10 PM End time: 08/05/2019 12:30 PM  Staffing Anesthesiologist: Elmer Picker, MD Performed: anesthesiologist   Preanesthetic Checklist Completed: patient identified, IV checked, risks and benefits discussed, monitors and equipment checked, pre-op evaluation and timeout performed  Epidural Patient position: sitting Prep: DuraPrep and site prepped and draped Patient monitoring: continuous pulse ox, blood pressure, heart rate and cardiac monitor Approach: midline Location: L3-L4 Injection technique: LOR air  Needle:  Needle type: Tuohy  Needle gauge: 17 G Needle length: 9 cm Needle insertion depth: 12 cm Catheter type: closed end flexible Catheter size: 19 Gauge Catheter at skin depth: 18 cm Test dose: negative  Assessment Sensory level: T8 Events: blood not aspirated, injection not painful, no injection resistance, no paresthesia and negative IV test  Additional Notes Patient identified. Risks/Benefits/Options discussed with patient including but not limited to bleeding, infection, nerve damage, paralysis, failed block, incomplete pain control, headache, blood pressure changes, nausea, vomiting, reactions to medication both or allergic, itching and postpartum back pain. Confirmed with bedside nurse the patient's most recent platelet count. Confirmed with patient that they are not currently taking any anticoagulation, have any bleeding history or any family history of bleeding disorders. Patient expressed understanding and wished to proceed. All questions were answered. Sterile technique was used throughout the entire procedure. Please see nursing notes for vital signs. Test dose was given through epidural catheter and negative prior to continuing to dose epidural or start infusion. Warning signs of high block given to the patient including shortness of breath, tingling/numbness in hands, complete motor  block, or any concerning symptoms with instructions to call for help. Patient was given instructions on fall risk and not to get out of bed. All questions and concerns addressed with instructions to call with any issues or inadequate analgesia.  Reason for block:procedure for pain

## 2019-08-05 NOTE — Anesthesia Preprocedure Evaluation (Addendum)
Anesthesia Evaluation  Patient identified by MRN, date of birth, ID band Patient awake    Reviewed: Allergy & Precautions, NPO status , Patient's Chart, lab work & pertinent test results  Airway Mallampati: III  TM Distance: >3 FB Neck ROM: Full    Dental no notable dental hx. (+) Teeth Intact, Dental Advisory Given   Pulmonary neg pulmonary ROS, former smoker,    Pulmonary exam normal breath sounds clear to auscultation       Cardiovascular negative cardio ROS Normal cardiovascular exam Rhythm:Regular Rate:Normal     Neuro/Psych PSYCHIATRIC DISORDERS Depression negative neurological ROS     GI/Hepatic negative GI ROS, Neg liver ROS,   Endo/Other  diabetes, GestationalMorbid obesity (BMI 54)  Renal/GU negative Renal ROS  negative genitourinary   Musculoskeletal negative musculoskeletal ROS (+)   Abdominal   Peds  Hematology negative hematology ROS (+)   Anesthesia Other Findings IOL for gDM  Reproductive/Obstetrics (+) Pregnancy                             Anesthesia Physical Anesthesia Plan  ASA: III  Anesthesia Plan: Epidural   Post-op Pain Management:    Induction:   PONV Risk Score and Plan: 2 and Treatment may vary due to age or medical condition  Airway Management Planned: Natural Airway  Additional Equipment:   Intra-op Plan:   Post-operative Plan:   Informed Consent: I have reviewed the patients History and Physical, chart, labs and discussed the procedure including the risks, benefits and alternatives for the proposed anesthesia with the patient or authorized representative who has indicated his/her understanding and acceptance.       Plan Discussed with: Anesthesiologist  Anesthesia Plan Comments: (Patient identified. Risks, benefits, options discussed with patient including but not limited to bleeding, infection, nerve damage, paralysis, failed block,  incomplete pain control, headache, blood pressure changes, nausea, vomiting, reactions to medication, itching, and post partum back pain. Confirmed with bedside nurse the patient's most recent platelet count. Confirmed with the patient that they are not taking any anticoagulation, have any bleeding history or any family history of bleeding disorders. Patient expressed understanding and wishes to proceed. All questions were answered. )        Anesthesia Quick Evaluation

## 2019-08-06 ENCOUNTER — Encounter (HOSPITAL_COMMUNITY): Payer: Self-pay | Admitting: Obstetrics & Gynecology

## 2019-08-06 ENCOUNTER — Inpatient Hospital Stay: Payer: Self-pay

## 2019-08-06 DIAGNOSIS — O24420 Gestational diabetes mellitus in childbirth, diet controlled: Secondary | ICD-10-CM

## 2019-08-06 DIAGNOSIS — Z8759 Personal history of other complications of pregnancy, childbirth and the puerperium: Secondary | ICD-10-CM

## 2019-08-06 DIAGNOSIS — Z3A36 36 weeks gestation of pregnancy: Secondary | ICD-10-CM

## 2019-08-06 DIAGNOSIS — O134 Gestational [pregnancy-induced] hypertension without significant proteinuria, complicating childbirth: Secondary | ICD-10-CM

## 2019-08-06 DIAGNOSIS — O133 Gestational [pregnancy-induced] hypertension without significant proteinuria, third trimester: Secondary | ICD-10-CM

## 2019-08-06 LAB — GLUCOSE, CAPILLARY
Glucose-Capillary: 75 mg/dL (ref 70–99)
Glucose-Capillary: 86 mg/dL (ref 70–99)
Glucose-Capillary: 107 mg/dL — ABNORMAL HIGH (ref 70–99)

## 2019-08-06 MED ORDER — MAGNESIUM HYDROXIDE 400 MG/5ML PO SUSP: 30.0000 mL | ORAL | Status: DC | PRN

## 2019-08-06 MED ORDER — IBUPROFEN 600 MG PO TABS
600.0000 mg | ORAL_TABLET | Freq: Four times a day (QID) | ORAL | Status: DC
  Administered 2019-08-06 – 2019-08-08 (×8): 600 mg via ORAL
  Filled 2019-08-06 (×8): qty 1

## 2019-08-06 MED ORDER — ONDANSETRON HCL 4 MG PO TABS: 4.0000 mg | ORAL_TABLET | ORAL | Status: DC | PRN

## 2019-08-06 MED ORDER — PRENATAL MULTIVITAMIN CH
1.0000 | ORAL_TABLET | Freq: Every day | ORAL | Status: DC
  Administered 2019-08-07 – 2019-08-08 (×2): 1 via ORAL
  Filled 2019-08-06 (×2): qty 1

## 2019-08-06 MED ORDER — SODIUM CHLORIDE 0.9% FLUSH: 10.0000 mL | INTRAVENOUS | Status: DC | PRN

## 2019-08-06 MED ORDER — SODIUM CHLORIDE 0.9% FLUSH: 10.0000 mL | Freq: Two times a day (BID) | INTRAVENOUS | Status: DC

## 2019-08-06 MED ORDER — DIBUCAINE (PERIANAL) 1 % EX OINT: 1.0000 "application " | TOPICAL_OINTMENT | CUTANEOUS | Status: DC | PRN

## 2019-08-06 MED ORDER — SENNOSIDES-DOCUSATE SODIUM 8.6-50 MG PO TABS
2.0000 | ORAL_TABLET | ORAL | Status: DC
  Administered 2019-08-06 – 2019-08-07 (×2): 2 via ORAL
  Filled 2019-08-06 (×2): qty 2

## 2019-08-06 MED ORDER — BENZOCAINE-MENTHOL 20-0.5 % EX AERO: 1.0000 "application " | INHALATION_SPRAY | CUTANEOUS | Status: DC | PRN

## 2019-08-06 MED ORDER — DIPHENHYDRAMINE HCL 25 MG PO CAPS: 25.0000 mg | ORAL_CAPSULE | Freq: Four times a day (QID) | ORAL | Status: DC | PRN

## 2019-08-06 MED ORDER — SIMETHICONE 80 MG PO CHEW: 80.0000 mg | CHEWABLE_TABLET | ORAL | Status: DC | PRN

## 2019-08-06 MED ORDER — WITCH HAZEL-GLYCERIN EX PADS: 1.0000 "application " | MEDICATED_PAD | CUTANEOUS | Status: DC | PRN

## 2019-08-06 MED ORDER — OXYCODONE HCL 5 MG PO TABS: 10.0000 mg | ORAL_TABLET | ORAL | Status: DC | PRN

## 2019-08-06 MED ORDER — COCONUT OIL OIL: 1.0000 "application " | TOPICAL_OIL | Status: DC | PRN

## 2019-08-06 MED ORDER — LACTATED RINGERS AMNIOINFUSION: INTRAVENOUS | Status: DC

## 2019-08-06 MED ORDER — ONDANSETRON HCL 4 MG/2ML IJ SOLN: 4.0000 mg | INTRAMUSCULAR | Status: DC | PRN

## 2019-08-06 MED ORDER — ACETAMINOPHEN 325 MG PO TABS: 650.0000 mg | ORAL_TABLET | ORAL | Status: DC | PRN

## 2019-08-06 MED ORDER — OXYCODONE HCL 5 MG PO TABS: 5.0000 mg | ORAL_TABLET | ORAL | Status: DC | PRN

## 2019-08-06 NOTE — Lactation Note (Signed)
This note was copied from a baby's chart. Lactation Consultation Note  Patient Name: [name] IOXBD'Z Date: 08/06/2019 Reason for consult: Initial assessment;Primapara;1st time breastfeeding;Term;Maternal endocrine disorder Type of Endocrine Disorder?: Diabetes(on metformin)  Visited with mom of a 10 hours old FT female, she's a P1. She reported (+) breast changes during the pregnancy. She participated in the [facility] program at [facility] but she wasn't familiar with hand expression. LC showed mom how to hand express, but only glistening droplets of colostrum were noted on the right breast, none of the left one. Mom's breast are very large, she has flat nipples and her tissue is non-compressible, she also has chronic left leg lymphedema and Hx of alcohol consumption during the pregnancy.  LC set her up with a hand pump and breast shells, instructions, cleaning and storage were reviewed as well as milk storage guidelines. Baby's serum glucose were fluctuating at 32, 43 and 52, he's already on Gerber gentle, that was mom's feeding choice on admission to do both, breast and formula. Reviewed formula supplementation guidelines according to baby's age in hours.  Offered assistance with latch but mom politely declined, she told LC baby already fed formula and wasn't due for a feeding. Asked mom to call for assistance when needed. Reviewed normal newborn behavior, feeding cues, cluster feeding, size of baby's stomach and lactogenesis II.  Feeding plan:  1. Encouraged mom to feed baby STS 8-12 times/24 hours or sooner if feeding cues are present  2. Mom will try pumping for 3-5 minutes prior feedings  3. She'll start wearing her breast shells tomorrow, daytime only 4. Parents will continue supplementing baby with Rush Barer formula per supplementation guidelines according to baby's age in hours  BF brochure, BF resources and feeding diary were reviewed. Dad present and supportive. Parents  reported all questions and concerns were answered, they're both aware of LC OP services and will call PRN.   Maternal Data Formula Feeding for Exclusion: Yes Reason for exclusion: Mother's choice to formula and breast feed on admission Has patient been taught Hand Expression?: Yes Does the patient have breastfeeding experience prior to this delivery?: No  Feeding Feeding Type: Bottle Fed - Formula Nipple Type: Slow - flow  LATCH Score                   Interventions Interventions: Breast feeding basics reviewed;Breast massage;Hand express;Breast compression;Hand pump;Shells  Lactation Tools Discussed/Used Tools: Pump;Shells Shell Type: Inverted Breast pump type: Manual WIC Program: Yes Pump Review: Setup, frequency, and cleaning;Milk Storage Initiated by:: MPeck Date initiated:: 08/06/19   Consult Status Consult Status: Follow-up Date: 08/07/19 Follow-up type: In-patient    [name] 08/06/2019, 9:57 PM

## 2019-08-06 NOTE — Discharge Summary (Signed)
Postpartum Discharge Summary      Patient Name: [name] DOB: [birth year] MRN: [id number]  Date of admission: 08/04/2019 Delivering Provider: Lyndee Hensen   Date of discharge: 08/08/2019  Admitting diagnosis: Labor and delivery, indication for care [O75.9] Intrauterine pregnancy: [redacted]w[redacted]d    Secondary diagnosis:  Active Problems:   Morbid obesity ([place]   Lymphedema of left lower extremity   Supervision of high risk pregnancy, antepartum   Late prenatal care begun [redacted]wk   Gestational diabetes   Labor and delivery, indication for care   NSVD (normal spontaneous vaginal delivery)   Gestational hypertension, third trimester  Additional problems: None     Discharge diagnosis: Term Pregnancy Delivered, Gestational Hypertension and GDM A2                                                                                                Post partum procedures:None  Augmentation: AROM, Pitocin, Cytotec and Foley Balloon  Complications: None  Hospital course:  Induction of Labor With Vaginal Delivery   34y.o. yo [name] at 322w2das admitted to the hospital 08/04/2019 for induction of labor.  Indication for induction: A2 DM.  Patient had an uncomplicated labor course as follows: admitted and initially ripened with Cytotec x5 and foley bulb. During initial stage of IOL had elevated BP's and labs consistent with gestational HTN. Subsequently induced further with pitocin and AROM and progressed to uncomplicated NSVD.  Membrane Rupture Time/Date: 5:56 PM ,08/05/2019   Intrapartum Procedures: Episiotomy: None [1]                                         Lacerations:  1st degree [2];Labial [10]  Patient had delivery of a Viable infant.  Information for the patient's newborn:  BrLatoia, Eyster0[[id number]]Delivery Method: Vaginal, Spontaneous(Filed from Delivery Summary)    08/06/2019  Details of delivery can be found in separate delivery note.  Patient had a routine postpartum  course. Patient is discharged home 08/08/19. Delivery time: 11:32 AM    Magnesium Sulfate received: No BMZ received: No Rhophylac:N/A MMR:N/A Transfusion:No  Physical exam  Vitals:   08/07/19 1404 08/07/19 2113 08/08/19 0527 08/08/19 0719  BP: 111/67 131/70 120/62 111/60  Pulse: 80 93 82 87  Resp: '18 18 18 18  '$ Temp: 98.2 F (36.8 C) 98.3 F (36.8 C) 97.7 F (36.5 C) 98.6 F (37 C)  TempSrc: Oral Oral Oral Oral  SpO2: 99% 100% 100% 98%  Weight:      Height:       General: alert, cooperative and no distress Lochia: appropriate Uterine Fundus: firm Incision: N/A DVT Evaluation: No evidence of DVT seen on physical exam. Labs: Lab Results  Component Value Date   WBC 10.7 (H) 08/05/2019   HGB 11.5 (L) 08/05/2019   HCT 36.8 08/05/2019   MCV 84.6 08/05/2019   PLT 232 08/05/2019   CMP Latest Ref Rng & Units 08/05/2019  Glucose 70 - 99 mg/dL 83  BUN 6 -  20 mg/dL 6  Creatinine 0.44 - 1.00 mg/dL 0.72  Sodium 135 - 145 mmol/L 138  Potassium 3.5 - 5.1 mmol/L 4.0  Chloride 98 - 111 mmol/L 107  CO2 22 - 32 mmol/L 22  Calcium 8.9 - 10.3 mg/dL 8.8(L)  Total Protein 6.5 - 8.1 g/dL 5.9(L)  Total Bilirubin 0.3 - 1.2 mg/dL 0.3  Alkaline Phos 38 - 126 U/L 77  AST 15 - 41 U/L 15  ALT 0 - 44 U/L 12   Edinburgh Score: Edinburgh Postnatal Depression Scale Screening Tool 08/07/2019  I have been able to laugh and see the funny side of things. (No Data)    Discharge instruction: per After Visit Summary and "Baby and Me Booklet".  After visit meds:  Allergies as of 08/08/2019      Reactions   Bee Venom Anaphylaxis, Swelling   Septra [sulfamethoxazole-trimethoprim] Other (See Comments)   Vomit and loose stools   Other Itching, Rash, Other (See Comments)   Cannot tolerate any med that end in -CILLIN; also develops listers on her scalp   Penicillins Rash   Has patient had a PCN reaction causing immediate rash, facial/tongue/throat swelling, SOB or lightheadedness with hypotension:  yes Has patient had a PCN reaction causing severe rash involving mucus membranes or skin necrosis: Unknown Has patient had a PCN reaction that required hospitalization: Unknown Has patient had a PCN reaction occurring within the last 10 years: yes If all of the above answers are "NO", then may proceed with Cephalosporin use. Tolerated Cefepime and Rocephin in the past.   Vancomycin Itching, Rash      Medication List    STOP taking these medications   glyBURIDE 5 MG tablet Commonly known as: DIABETA     TAKE these medications   Accu-Chek Guide Me w/Device Kit 1 each by Does not apply route 4 (four) times daily.   Accu-Chek Guide test strip Generic drug: glucose blood Use as instructed to check blood sugar daily   Accu-Chek Softclix Lancets lancets Use as instructed to check blood sugar daily   acetaminophen 325 MG tablet Commonly known as: Tylenol Take 2 tablets (650 mg total) by mouth every 4 (four) hours as needed (for pain scale < 4).   Blood Pressure Monitor Misc For regular home bp monitoring during pregnancy   hydrocerin Crea Apply 1 application topically 2 (two) times daily.   ibuprofen 600 MG tablet Commonly known as: ADVIL Take 1 tablet (600 mg total) by mouth every 6 (six) hours.   norethindrone 0.35 MG tablet Commonly known as: Ortho Micronor Take 1 tablet (0.35 mg total) by mouth daily.   prenatal vitamin w/FE, FA 27-1 MG Tabs tablet Take 1 tablet by mouth daily at 12 noon.   VICKS VAPOINHALER IN Place 1 each into both nostrils daily as needed (FOR ALLERGIES/CONGESTION).       Diet: carb modified diet  Activity: Advance as tolerated. Pelvic rest for 6 weeks.   Outpatient follow up:6 weeks Follow up Appt: Future Appointments  Date Time Provider [place]  08/14/2019 10:50 AM [facility] NURSE [facility]  08/29/2019  3:00 PM Perlie Mayo, NP [facility]  09/10/2019 11:30 AM Roma Schanz, CNM [facility]   Follow up  Visit:   Please schedule this patient for Postpartum visit in: 6 weeks with the following provider: Any provider In-Person For C/S patients schedule nurse incision check in weeks 2 weeks: no High risk pregnancy complicated by: GDMA2, gHTN Delivery mode:  SVD Anticipated Birth Control:  other/unsure PP Procedures needed: BP check, 2hr GTT  Schedule Integrated BH visit: no   Newborn Data: Live born female  Birth Weight:  2756 g APGAR: 9, 9  Newborn Delivery   Birth date/time: 08/06/2019 11:32:00 Delivery type: Vaginal, Spontaneous      Baby Feeding: Breast Disposition:home with mother   08/08/2019 Merilyn Baba, DO

## 2019-08-06 NOTE — Progress Notes (Addendum)
At bedside with Dr. Debroah Loop for recurrent decels. Terb given and positioned to right side. Patient currently with one 24 gauge in hand which is not sufficient for OR in case she needs to go back. PICC has been requested and we are told she is the first on the list at 0800. Moderate variability. Cervix 6/90/-2. Will attempt to give baby some rest and possibly restart Pit if able.   Jerilynn Birkenhead, MD [facility] Family Medicine Fellow, [facility] for [employer], [facility]

## 2019-08-06 NOTE — Progress Notes (Signed)
Labor Progress Note [name] is a 34 y.o. [name] at [redacted]w[redacted]d presented for IOL for GDMA2.   S:  Patient comfortable with epidural.   O:  BP (!) 142/66   Pulse (!) 103   Temp 98.4 F (36.9 C) (Oral)   Resp 18   Ht 5\' 9"  (1.753 m)   Wt (!) 166.9 kg   LMP  (LMP Unknown)   SpO2 100%   BMI 54.34 kg/m  EFM: 145, moderate variability, recurrent late decels TOCO: q2-45m  CVE: Dilation: 5.5 Effacement (%): 90 Cervical Position: Posterior Station: -3 Presentation: Vertex Exam by:: [email or web address], RNC   A&P: 33 y.o. [name] [redacted]w[redacted]d admitted for IOL for GDMA2.  #Labor: S/p Cytotec x5, cooks catheter and AROM/IUPC. Now with recurrent lates. Will turn off Pitocin for infant to recover and plan to restart in ~ 1 hour if Cat I. Anticipate SVD, CS as appropriate. #Pain: epidural  #FWB: Cat II; reassuring for moderate variability  #GBS negative  #GDMA2: last glucose 75 #gHTN: Pre-E labs negative, normal range currently; asymptomatic   [redacted]w[redacted]d, MD 4:01 AM

## 2019-08-06 NOTE — Anesthesia Postprocedure Evaluation (Signed)
Anesthesia Post Note  Patient: [name]  Procedure(s) Performed: AN AD HOC LABOR EPIDURAL     Patient location during evaluation: Mother Baby Anesthesia Type: Epidural Level of consciousness: awake, awake and alert and oriented Pain management: pain level controlled Vital Signs Assessment: post-procedure vital signs reviewed and stable Respiratory status: spontaneous breathing Cardiovascular status: blood pressure returned to baseline Postop Assessment: no headache, no backache, patient able to bend at knees, no apparent nausea or vomiting, adequate PO intake and able to ambulate Anesthetic complications: no    Last Vitals:  Vitals:   08/06/19 1430 08/06/19 1550  BP: 139/90 133/82  Pulse: (!) 103 83  Resp: 20 20  Temp: 36.8 C 36.8 C  SpO2: 100% 100%    Last Pain:  Vitals:   08/06/19 1740  TempSrc:   PainSc: 0-No pain   Pain Goal: Patients Stated Pain Goal: 9 (08/05/19 0720)                 Jennelle Human

## 2019-08-06 NOTE — Progress Notes (Signed)
Labor Progress Note GWENDY BOEDER is a 34 y.o. [name] at [redacted]w[redacted]d presented for IOL for GDMA2.   S:  Patient comfortable with epidural. Went bedside for prolonged decel.   O:  BP 130/80   Pulse 90   Temp 98 F (36.7 C) (Oral)   Resp 18   Ht 5\' 9"  (1.753 m)   Wt (!) 166.9 kg   LMP  (LMP Unknown)   SpO2 100%   BMI 54.34 kg/m  EFM: 125, moderate variability, prolonged decel resolved with position change and turning off Pit (tachysystole) TOCO: q32m  CVE: Dilation: 5.5 Effacement (%): 90 Cervical Position: Posterior Station: -3 Presentation: Vertex Exam by:: [email or web address], RNC   A&P: 34 y.o. [name] [redacted]w[redacted]d admitted for IOL for GDMA2.  #Labor: S/p Cytotec x5, cooks catheter and AROM/IUPC. MVU's adequate since last exam and IUPC adjusted. Went bedside for prolonged decel. Amnioinfusion had just been started for variables and then patient became tachysystolic. Pit turned off. Will plan to restart at half once Cat I for at least 10 minutes. Anticipate SVD. #Pain: epidural  #FWB: Cat II; reassuring for moderate variability and accels  #GBS negative  #GDMA2: last glucose 93 #gHTN: Pre-E labs negative, normal range currently; asymptomatic   [redacted]w[redacted]d, MD 12:18 AM

## 2019-08-07 LAB — GLUCOSE, CAPILLARY
Glucose-Capillary: 68 mg/dL — ABNORMAL LOW (ref 70–99)
Glucose-Capillary: 96 mg/dL (ref 70–99)

## 2019-08-07 NOTE — Clinical Social Work Maternal (Addendum)
CLINICAL SOCIAL WORK MATERNAL/CHILD NOTE  Patient Details  Name: [name] MRN: [id number] Date of Birth: Sep 11, 1985  Date:  08/07/2019  Clinical Social Worker Initiating Note:  Durward Fortes, LCSW Date/Time: Initiated:  08/07/19/0920     Child's Name:  [name]   Biological Parents:  Mother   Need for Interpreter:  None   Reason for Referral:  Late or No Prenatal Care    Address:  [address] [place] [address]    Phone number:  [phone] (home)     Additional phone number: none   Household Members/Support Persons (HM/SP):   Household Member/Support Person 2   HM/SP Name Relationship DOB or Age  HM/SP -1   Janett Labella  FOB   12  HM/SP -2 Jonia Matera MOB   34  HM/SP -3        HM/SP -4        HM/SP -5        HM/SP -6        HM/SP -7        HM/SP -8          Natural Supports (not living in the home):  Parent   Professional Supports: None   Employment: Disabled   Type of Work:   none   Education:  High school graduate   Homebound arranged:  n/a  Museum/gallery curator Resources:  Medicaid, SSI/Disability   Other Resources:  Physicist, medical , [place] Considerations Which May Impact Care:  none reported.   Strengths:  Ability to meet basic needs , Compliance with medical plan , Home prepared for child , Pediatrician chosen   Psychotropic Medications:    none      Pediatrician:    [facility]  Pediatrician List:   [facility] Other([facility]  [facility]      Pediatrician Fax Number:    Risk Factors/Current Problems:  None   Cognitive State:  Alert , Insightful , Able to Concentrate    Mood/Affect:  Relaxed , Comfortable , Interested , Happy , Calm    CSW Assessment: CSW consulted as MOB received LPNC. CSW also received  consult as RN expressed hearing FOB make "weird comments" to MOB while in L&D. CSW went to  speak with MOB at bedside to address further needs and concerns.   CSW entered the room. CSW congratulated MOB and FOB on the birth of infant. MOB thanked CSW and offered CSW chocolate. CSW declined chocolate and asked what baby's name was. MOB reported "his name is [name]". CSW expressed love for this name and advised MOB of HIPPA policy. CSW asked that FOB leave the room. FOB agreeable and left room with no issue. CSW advised MOB for CSW's role and there reason for CSW coming to visit with her. MOB reported that she is very excited to have infant. "I was told that I have PCOS and therefore never expected to have children". MOB reports that this is the reason for her starting care at 7 months. MOB reports that she didn't know that she was pregnant as "I was always told that I would never have kids, so I didn't know". CSW understanding of this and advised MOB of hospital drug screen policy. MOB reported that all of her drug screens were negative therefore she knows infant will be as well. CSW understanding but also advised MOB to  leave cotton balls in infants pamper as urine is needed to rn drug screen. MOB reported understanding and expressed that she would. CSW advised MOB of the CDS as well and advised MOB that if UDS or CDS is positive for substances that MOB was given pr prescribed by MD, then CSW would need to make CPS report. MOB reported understanding and denied having any CPS hx as "this is my first child". CSW did seek further details form MOB on her ETOH use during this pregnancy. MOB reports to CSW that she last used ETOH in November/December of 2019. CSW advised MOB That it is reported that she used this past January, MOB currently denies this when CSW asks her.    CSW assessed MOB for mental health. MOB reports that she was diagnosed with depression at the age of 36. MOB reports that she was seen at [facility] in [place] then but has since been in no counseling. MOB declined wanting resources for  therapy at this time. MOB reports that she has been feeling well and excited since giving birth. MOB reports that she isn't feeling SI or HI and reported that she is not in DV relationship. When asked about feeling safe at home MOB reports that she feels safe as well. MOB ddi expressed that at times FOB tells her she is holding infant to much. CSW encouraged Mob and FOB to hold infant as much as they like as infants love skin to skin. MOB laughed and gleamed at infant in basinet.   CSW took time to provide MOB with PPD and SIDS education. MOB was given PPD Checklist in order to keep track of feelings as they relate to PPD. MOB reported that she has all needed items to care for infant and support from FOB and her mom.   CSW will continue to monitor infants UDS and CDS and make CPS report if warranted.  CSW Plan/Description:  No Further Intervention Required/No Barriers to Discharge, Sudden Infant Death Syndrome (SIDS) Education, Perinatal Mood and Anxiety Disorder (PMADs) Education, CSW Will Continue to Monitor Umbilical Cord Tissue Drug Screen Results and Make Report if [facility] Drug Screen Policy Information    Loralie Champagne 08/07/2019, 10:21 AM

## 2019-08-07 NOTE — Care Management Important Message (Signed)
Important Message  Patient Details  Name: [name] MRN: [id number] Date of Birth: 05/03/1985   Medicare Important Message Given:  Yes     Renie Ora 08/07/2019, 11:42 AM

## 2019-08-07 NOTE — Lactation Note (Signed)
This note was copied from a baby's chart. Lactation Consultation Note  Patient Name: [name] [id number] Date: 08/07/2019 Reason for consult: Follow-up assessment;Primapara;1st time breastfeeding;Term Type of Endocrine Disorder?: PCOS  LC student completed a follow-up consult with Ms. Packham and FOB. "[name]" was swaddled and resting with Ms. Goulart in her bed. MOB reported that she had concerns with still only having a few drops of colostrum. [facility] student reviewed lactogenesis expectations and encouraged her to hand express and/or hand pump frequently.   MOB stated [name] had a bottle of formula recently, but was unaware of paced bottle feeding. MOB reported that [name] gets frustrated at the breast due to the "slow flow" of the colostrum. [facility] student reviewed paced bottle feeding technique - both parents were engaged and understood. [facility] student also reviewed supplementation guidelines and invited MOB to call out for Lactation if she would like assistance for the next feed.   MOB reported that she had no further questions are concerned. Both FOB and MOB seem to be elated with [name]'s arrival.   Feeding Plan: Frequently place [name] STS  Frequently hand express/utilize hand pump Place [name] to the breast 8-12x in 24 hours Follow up with formula, according to supplementation guidelines, as needed.   Maternal Data Reason for exclusion: Mother's choice to formula and breast feed on admission Has patient been taught Hand Expression?: Yes Does the patient have breastfeeding experience prior to this delivery?: No  Feeding Feeding Type: Bottle Fed - Formula Nipple Type: Slow - flow  Interventions Interventions: Breast feeding basics reviewed  Lactation Tools Discussed/Used     Consult Status Consult Status: Follow-up Date: 08/08/19 Follow-up type: In-patient    Gregery Na 08/07/2019, 6:24 PM

## 2019-08-07 NOTE — Progress Notes (Addendum)
POSTPARTUM PROGRESS NOTE  Post partum Day 1 Subjective:  [name] is a 34 y.o. G2P1011 [redacted]w[redacted]d s/p IOL for GDMA2 and developed gHTN.  No acute events overnight.  Pt denies problems with ambulating, voiding or po intake.  She denies nausea or vomiting.  Pain is well controlled.  She has had flatus. She has not had bowel movement.  Lochia Small.  Patient is concerned she is not producing enough milk and has supplemented with bottle feeding.   Objective: Blood pressure 105/82, pulse 89, temperature 97.8 F (36.6 C), resp. rate 18, height 5\' 9"  (1.753 m), weight (!) 166.9 kg, SpO2 100 %, unknown if currently breastfeeding.  Physical Exam:  General: alert, cooperative and no distress Lochia:normal flow Chest: no respiratory distress Heart:regular rate, distal pulses intact Abdomen: soft, nontender,  Uterine Fundus: firm, appropriately tender DVT Evaluation: No calf swelling or tenderness Extremities: left leg lymphedema  Recent Labs    08/04/19 0750 08/05/19 0736  HGB 11.1* 11.5*  HCT 35.4* 36.8    Assessment/Plan:  ASSESSMENT: [name] is a 33 y.o. G2P1011 [redacted]w[redacted]d s/p IOL for A2GDM who developed HTN during initial stages of induction.  Pt doing well today.  No complaints but is concerned about amount of milk she is producing. reassured pt that and encouraged continued attempts to breastfeed. She has talked with lactation already.    Plan for discharge tomorrow, Breastfeeding, Circumcision prior to discharge and Contraception : POPs   LOS: 3 days   [redacted]w[redacted]d 08/07/2019, 7:04 AM   I saw and evaluated the patient. I agree with the findings and the plan of care as documented in the resident's note. BP's stable, cont to monitor for anti-hypertensive need. Okay to discharge today if baby can; RN to page team for orders.  08/09/2019, MD [facility] Family Medicine Fellow, [employer] for [facility]

## 2019-08-07 NOTE — Progress Notes (Signed)
Pt CBG result 68. 4oz of orange juice given per hypoglycemic protocol.

## 2019-08-08 ENCOUNTER — Encounter (HOSPITAL_COMMUNITY): Payer: Self-pay | Admitting: Obstetrics & Gynecology

## 2019-08-08 ENCOUNTER — Ambulatory Visit: Payer: Self-pay

## 2019-08-08 MED ORDER — IBUPROFEN 600 MG PO TABS: 600.0000 mg | ORAL_TABLET | Freq: Four times a day (QID) | ORAL | 0 refills | Status: DC

## 2019-08-08 MED ORDER — NORETHINDRONE 0.35 MG PO TABS: 1.0000 | ORAL_TABLET | Freq: Every day | ORAL | 11 refills | Status: DC

## 2019-08-08 MED ORDER — ACETAMINOPHEN 325 MG PO TABS: 650.0000 mg | ORAL_TABLET | ORAL | 0 refills | Status: DC | PRN

## 2019-08-08 NOTE — Lactation Note (Signed)
This note was copied from a baby's chart. Lactation Consultation Note F/u w/mom to see how BF and supplementing was going. Mom stated "it was a disaster and the baby isn't having it". Mom stated she didn't like him getting that upset and its hard w/baby on photo therapy. Mom stated she was suppose to go home today.  Mom stated she has something in her breast. LC suggested pumping and getting it out. Mom looked at the pump and stated maybe.  Discussed engorgement and breast filling. Mom has a pump at home. Suggested to mom if she didn't like putting the baby to the breast she could always pump and bottle feed. LC is here to help mom w/what ever decision she makes in feeding her baby. Reviewed supply and demand. Mom seems reluctant and acts as if she doesn't want to BF or pump.  Suggested hand expression as well.  Asked mom to call for assistance if she needs any w/BF, pumping, or hand expression.  Patient Name: [name] [id number] Date: 08/08/2019 Reason for consult: Follow-up assessment;Hyperbilirubinemia;Infant < 6lbs;Primapara;Term Type of Endocrine Disorder?: PCOS   Maternal Data    Feeding Feeding Type: Formula Nipple Type: Slow - flow  LATCH Score                   Interventions    Lactation Tools Discussed/Used     Consult Status Consult Status: Follow-up Date: 08/09/19 Follow-up type: In-patient    Charyl Dancer 08/08/2019, 8:25 PM

## 2019-08-08 NOTE — Lactation Note (Addendum)
This note was copied from a baby's chart. Lactation Consultation Note  Patient Name: [name] [id number] Date: 08/08/2019 Reason for consult: Follow-up assessment Type of Endocrine Disorder?: PCOS   Baby 45 hours old.  Mother has been primarily formula feeding. Mother states she thought due to her nipples she could not breastfeed. LC reassured mother that we should try to latch if she is interested and offered help. FOB stated baby is sleeping.  Mother will call for latch assistance if desired. Reviewed hand expression with drops expressed and engorgement care. Recommend pumping on a regular basis. Faxed pump WIC referral to [facility]. Feed on demand with cues.  Goal 8-12+ times per day after first 24 hrs.  Place baby STS if not cueing.   Returned to room to assist mother with latching. Mother's nipples are flat. Attempting latching with and without nipple shield. Applied #20NS and prefilled with formula and baby latched for 5 min. Applied 5 french with NS and baby had a difficult time sustaining latch. Suggest mother continue to attempt at the breast, supplement with formula and pump a minimum of 8 times per day. Reviewed milk storage and cleaning. Reviewed engorgement care and monitoring voids/stools. Mother has manual pump but will need to pick up [facility] pump if she decides to pump.   DEBP was set up. Baby was both finger syringe fed and then given a slow flow bottle w/ formula. Baby and mother frustrated.       Maternal Data    Feeding Feeding Type: Bottle Fed - Formula Nipple Type: Slow - flow  LATCH Score                   Interventions Interventions: Hand express  Lactation Tools Discussed/Used     Consult Status Consult Status: Follow-up Date: 08/08/19 Follow-up type: In-patient    Dahlia Byes [facility] 08/08/2019, 8:45 AM

## 2019-08-09 ENCOUNTER — Ambulatory Visit: Payer: Self-pay

## 2019-08-09 NOTE — Lactation Note (Signed)
This note was copied from a baby's chart. Lactation Consultation Note  Patient Name: [name] [id number] Date: 08/09/2019 Reason for consult: Follow-up assessment  LC Follow Up Visit:  Mother called me back into her room after our early morning visit today.  Mother was so excited to show me the milk she had pumped.  She had 30 mls of EBM in a bottle at her bedside table.  I was so pleased that she followed my suggestions given this a.m.  Prior to today she had not pumped at all since yesterday and is now pumping every three hours.  Reminded her to always feed her breast milk before giving any formula supplementation.  Mother verbalized understanding.  Praised her efforts and provided emotional support for a job well done!    Family present.  RN in room.  Mother will call for any further questions/concerns.   Maternal Data    Feeding Feeding Type: Bottle Fed - Formula Nipple Type: Slow - flow  LATCH Score                   Interventions    Lactation Tools Discussed/Used     Consult Status Consult Status: Follow-up Date: 08/10/19 Follow-up type: In-patient    [name] [name] 08/09/2019, 4:02 PM

## 2019-08-09 NOTE — Lactation Note (Addendum)
This note was copied from a baby's chart. Lactation Consultation Note  Patient Name: [name] Date: 08/09/2019 Reason for consult: Follow-up assessment  P1 mother whose infant is now 3 hours old.  This is a term baby at 39+2 weeks.  Mother has been primarily bottle feeding.  When questioned about her goals mother stated she does not want to latch baby to her breast, however, she has decided to pump and bottle feed.  Mother has not pumped since yesterday around 1100 and then only pumped for 10 minutes due to her desire to stop because her mother was coming to visit.  Offered to assist mother with pumping now.  Educated her on the importance of pumping at least every three hours to establish a good milk supply.  Mother seemed surprised that she had to pump so often.  Pump set up and mother began to pump.  She will continue pumping every three hours from now on throughout the day and night.    Suggested that mother increase baby's supplementation volumes to 30-60 mls per feeding.  Mother verbalized understanding; suggested she not limit him if he desires more volume than what he has been consuming, especially due to his need for phototherapy.  Observed baby's bili blanket not positioned correctly this morning when I came in to visit.  The light side of the blanket was facing the mattress, not the baby.  Corrected the bili blanket and swaddled baby.  Mother had the baby tucked under the blanket and his face was not visible.  Instructed her not to cover baby's face at any time with the blanket.  Demonstrated how he could be properly swaddled with the bili blanket and his face visible.  Mother has a DEBP for home use.  Father present. RN updated.    Maternal Data    Feeding Feeding Type: Bottle Fed - Formula Nipple Type: Slow - flow  LATCH Score                   Interventions    Lactation Tools Discussed/Used     Consult Status Consult Status:  Follow-up Date: 08/09/19 Follow-up type: In-patient    [name] 08/09/2019, 7:47 AM

## 2019-08-10 ENCOUNTER — Ambulatory Visit: Payer: Self-pay

## 2019-08-10 NOTE — Lactation Note (Addendum)
This note was copied from a baby's chart. Lactation Consultation Note  Patient Name: [name] [id number] Date: 08/10/2019 Reason for consult: Follow-up assessment Type of Endocrine Disorder?: PCOS   Baby 92 hours old and on phototherapy.   Mother was happy to tell LC that she had pumped 30 ml at 0400 this morning.   Praised her for her efforts since [facility] had worked with mother earlier during her stay because she really wanted to breastfeed and had become frustrated with difficult latch and sm amount of colostrum.  She was happy she could provide her own breastmilk to baby now. She is also supplementing with formula. Since she had not pumped since 0400, reminded her to pump at least q 3 hours for a minimum of 8 times per day. Mother has spoken to [facility] yesterday in [place] and they will provide her with a breast pump once she is discharged but since it is the weekend, offered mother [facility] loaner. Mother is deciding if she would like [facility] loaner and will call if desired. Reviewed engorgement care and monitoring voids/stools. LC demonstrated to FOB how to massage breasts during pumping to increase supply.       Maternal Data    Feeding Feeding Type: Bottle Fed - Formula Nipple Type: Slow - flow  LATCH Score                   Interventions Interventions: Breast massage;DEBP;Breast feeding basics reviewed  Lactation Tools Discussed/Used WIC Program: Yes Pump Review: Setup, frequency, and cleaning;Milk Storage Initiated by:: RN Date initiated:: 08/08/19   Consult Status Consult Status: Complete Date: 08/10/19    Dahlia Byes [place] 08/10/2019, 8:09 AM

## 2019-08-11 ENCOUNTER — Ambulatory Visit: Payer: Self-pay

## 2019-08-11 NOTE — Lactation Note (Signed)
This note was copied from a baby's chart. Lactation Consultation Note  Patient Name: [name] Date: 08/11/2019   Baby 5 days old.  Baby off phototherapy. Mother pumped 100 ml of breastmilk this morning and was giving to baby upon entering. Reviewed burping and paced feeding. Feed on demand with cues.  Goal 8-12+ times per day after first 24 hrs.  Place baby STS if not cueing.  Encouraged mother to pump a minimum of 8 times per day. She has DEBP at home.  Will also pick up [facility] pump tomorrow.  Reviewed engorgement care and monitoring voids/stools.      Maternal Data    Feeding Feeding Type: Bottle Fed - Formula(encouraged mom to provide her EBM before supplementing) Nipple Type: Slow - flow  LATCH Score                   Interventions Interventions: DEBP  Lactation Tools Discussed/Used Tools: Pump   Consult Status      Hardie Pulley 08/11/2019, 8:51 AM

## 2019-08-29 ENCOUNTER — Encounter: Payer: Medicare Other | Admitting: Family Medicine

## 2019-09-02 ENCOUNTER — Other Ambulatory Visit: Payer: Self-pay

## 2019-09-02 ENCOUNTER — Telehealth (INDEPENDENT_AMBULATORY_CARE_PROVIDER_SITE_OTHER): Payer: Medicare Other

## 2019-09-02 VITALS — BP 120/62 | Ht 69.0 in | Wt 368.0 lb

## 2019-09-02 DIAGNOSIS — Z0000 Encounter for general adult medical examination without abnormal findings: Secondary | ICD-10-CM
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
## 2019-09-10 ENCOUNTER — Encounter: Payer: Self-pay | Admitting: Women's Health

## 2019-09-10 ENCOUNTER — Ambulatory Visit (INDEPENDENT_AMBULATORY_CARE_PROVIDER_SITE_OTHER): Payer: Medicare Other | Admitting: Women's Health

## 2019-09-10 NOTE — Progress Notes (Signed)
POSTPARTUM VISIT Patient name: [name] MRN [id number]  Date of birth: 1985/12/06 Chief Complaint:   Postpartum Care  History of Present Illness:   [name] is a 34 y.o. G49P1011 African American female being seen today for a postpartum visit. She is 5 weeks postpartum following a spontaneous vaginal delivery at 39.2 gestational weeks after IOL for A2DM. Dx w/ GHTN intrapartum. Anesthesia: epidural.  I have fully reviewed the prenatal and intrapartum course. Pregnancy complicated by late pnc, A2DM, GHTN. Postpartum course has been uncomplicated. Bleeding no bleeding. Bowel function is had bleeding x 1 w/ bm yesterday. Bladder function is normal.  Patient is sexually active.    No LMP recorded.  Baby's course has been uncomplicated. Baby is feeding by breast & bottle    Edinburgh Postpartum Depression Screening:  Edinburgh Postnatal Depression Scale - 09/10/19 1130      Edinburgh Postnatal Depression Scale:  In the Past 7 Days   I have been able to laugh and see the funny side of things.  0    I have looked forward with enjoyment to things.  0    I have blamed myself unnecessarily when things went wrong.  1    I have been anxious or worried for no good reason.  0    I have felt scared or panicky for no good reason.  0    Things have been getting on top of me.  1    I have been so unhappy that I have had difficulty sleeping.  0    I have felt sad or miserable.  1    I have been so unhappy that I have been crying.  0    The thought of harming myself has occurred to me.  0    Edinburgh Postnatal Depression Scale Total  3      Review of Systems:   Pertinent items are noted in HPI Denies Abnormal vaginal discharge w/ itching/odor/irritation, headaches, visual changes, shortness of breath, chest pain, abdominal pain, severe nausea/vomiting, or problems with urination or bowel movements. Pertinent History Reviewed:  Reviewed past medical,surgical, obstetrical and  family history.  Reviewed problem list, medications and allergies. OB History  Gravida Para Term Preterm AB Living  '2 1 1   1 1  '$ SAB TAB Ectopic Multiple Live Births  1     0 1    # Outcome Date GA Lbr Len/2nd Weight Sex Delivery Anes PTL Lv  2 Term 08/06/19 [id number]:07 / 00:27 6 lb 1.2 oz (2.756 kg) M Vag-Spont EPI  LIV     Birth Comments: moulding/caput  1 SAB            Physical Assessment:   Vitals:   09/10/19 1131  BP: 123/82  Pulse: 70  Weight: (!) 339 lb (153.8 kg)  Body mass index is 50.06 kg/m.       Physical Examination:   General appearance: alert, well appearing, and in no distress  Mental status: alert, oriented to person, place, and time  Skin: warm & dry   Cardiovascular: normal heart rate noted   Respiratory: normal respiratory effort, no distress   Breasts: deferred, no complaints   Abdomen: soft, non-tender   Pelvic: examination not indicated  Rectal: not examined   Extremities: chronic Lt lyphadema       No results found for this or any previous visit (from the past 24 hour(s)).  Assessment & Plan:  1) Postpartum exam 2) 5 wks s/p  SVB after IOL for A2DM 3) Breast & bottlefeeding-tips to increase supply given 4) Depression screening 5) Contraception counseling  Essential components of care per ACOG recommendations:  1.  Mood and well being:  . Patient with negative depression screening today. . Pre-existing mental health disorders? Yes, h/o dep/anx- no meds, feels she is doing well right now. Does not currently have therapist/doesn't feel she needs one  . Patient does not use tobacco. If using tobacco we discussed reduction/cessation and risk of relapse . Substance use disorder? No   2. Infant care and feeding:  . Patient currently breastfeeding? Yes  . Childcare strategy if returning to work/school: n/a-on disability for leg . Infant has a pediatrician/family doctor? Yes  . Recommended that all caregivers be immunized for flu, pertussis and  other preventable communicable diseases . Pt does have material needs met such as stable housing, utilities, food and diapers. If not, referred to local resources for help obtaining these.  3. Sexuality, contraception and birth spacing . Provided guidance regarding sexuality, management of dyspareunia, and resumption of intercourse . Patient does not know want a pregnancy in the future.  Desired family size is max of 2 children.  . Discussed avoiding interpregnancy interval <75mhs and recommended birth spacing of 18 months . Reviewed forms of contraception. Given rx for micronor in hospital-has already started, to set alarm to help remind her to take at same time daily, if late taking and has sex- use condom  4. Sleep and fatigue . Discussed coping options for fatigue and sleep disruption . Encouraged family/partner/community support of 4 hrs of uninterrupted sleep to help with mood and fatigue  5. Physical recovery  . Pt did not have a cesarean section. . Patient had a 1st degree laceration, perineal healing and pain reviewed.  . Patient has urinary incontinence? Yes-some when sneeze, fecal incontinence? No If yes, discussed Kegels, if worsens/not improving- refer to PT  . Patient is safe to resume physical activity. Discussed attainment of healthy weight.  6.  Chronic disease management . Discussed pregnancy complications if any, and their implications for future childbearing and long-term maternal health. . Review recommendations for prevention of recurrent pregnancy complications, such as 17 hydroxyprogesterone caproate to reduce risk for recurrent PTB not applicable, or aspirin to reduce risk of preeclampsia yes. . Pt had GDM: Yes. If yes, 2hr GTT scheduled: Yes in 3wks . Reviewed medications and non-pregnant dosing including consideration of whether pt is breastfeeding using a reliable resource such as LactMed: not applicable . Referred for f/u w/ PCP or subspecialist providers as  indicated: not applicable  7. Health maintenance . Last pap smear 01/02/19 and results were normal . Mammogram at 36yo or earlier if indicated  Meds: No orders of the defined types were placed in this encounter.   Follow-up: Return in about 3 weeks (around 10/01/2019) for 2hr sugar test (no visit); then 180yror physical.   No orders of the defined types were placed in this encounter.   [email or web address]/18/2021 12:06 PM

## 2019-09-10 NOTE — Patient Instructions (Addendum)
You will have your sugar test next visit.  Please do not eat or drink anything after midnight the night before you come, not even water.  You will be here for at least two hours.  Please make an appointment online for the bloodwork at [email or web address] for 8:30am (or as close to this as possible). Make sure you select the [facility] service center. The day of the appointment, check in with our office first, then you will go to [facility] to start the sugar test.   Tips To Increase Milk Supply  Lots of water! Enough so that your urine is clear  Plenty of calories, if you're not getting enough calories, your milk supply can decrease  Breastfeed/pump often, every 2-3 hours x 20-86mins  Fenugreek 3 pills 3 times a day, this may make your urine smell like maple syrup  Mother's Milk Tea  Lactation cookies, google for the recipe  Real oatmeal  Body Armor sports drinks

## 2019-10-01 ENCOUNTER — Other Ambulatory Visit: Payer: Medicare Other

## 2019-10-29 DIAGNOSIS — H5213 Myopia, bilateral: Secondary | ICD-10-CM | POA: Diagnosis not present

## 2019-10-29 DIAGNOSIS — H52223 Regular astigmatism, bilateral: Secondary | ICD-10-CM | POA: Diagnosis not present

## 2019-11-14 DIAGNOSIS — Z23 Encounter for immunization: Secondary | ICD-10-CM | POA: Diagnosis not present

## 2019-12-15 DIAGNOSIS — Z23 Encounter for immunization: Secondary | ICD-10-CM | POA: Diagnosis not present

## 2019-12-18 DIAGNOSIS — Z1159 Encounter for screening for other viral diseases: Secondary | ICD-10-CM | POA: Diagnosis not present

## 2020-03-04 ENCOUNTER — Ambulatory Visit (INDEPENDENT_AMBULATORY_CARE_PROVIDER_SITE_OTHER): Payer: Medicare HMO | Admitting: Nurse Practitioner

## 2020-03-04 ENCOUNTER — Encounter (INDEPENDENT_AMBULATORY_CARE_PROVIDER_SITE_OTHER): Payer: Self-pay | Admitting: Nurse Practitioner

## 2020-03-04 ENCOUNTER — Other Ambulatory Visit: Payer: Self-pay

## 2020-03-04 DIAGNOSIS — Z13220 Encounter for screening for lipoid disorders: Secondary | ICD-10-CM | POA: Diagnosis not present

## 2020-03-04 DIAGNOSIS — N926 Irregular menstruation, unspecified: Secondary | ICD-10-CM

## 2020-03-04 DIAGNOSIS — I890 Lymphedema, not elsewhere classified: Secondary | ICD-10-CM

## 2020-03-04 DIAGNOSIS — D649 Anemia, unspecified: Secondary | ICD-10-CM

## 2020-03-04 DIAGNOSIS — Z8632 Personal history of gestational diabetes: Secondary | ICD-10-CM

## 2020-03-04 DIAGNOSIS — E559 Vitamin D deficiency, unspecified: Secondary | ICD-10-CM

## 2020-03-04 LAB — POCT URINE PREGNANCY: Preg Test, Ur: NEGATIVE

## 2020-03-04 NOTE — Patient Instructions (Signed)
[facility] Dietary Recommendations for Weight Loss What to Avoid . Avoid added sugars o Often added sugar can be found in processed foods such as many condiments, dry cereals, cakes, cookies, chips, crisps, crackers, candies, sweetened drinks, etc.  o Read labels and AVOID/DECREASE use of foods with the following in their ingredient list: Sugar, fructose, high fructose corn syrup, sucrose, glucose, maltose, dextrose, molasses, cane sugar, brown sugar, any type of syrup, agave nectar, etc.   . Avoid snacking in between meals . Avoid foods made with flour o If you are going to eat food made with flour, choose those made with whole-grains; and, minimize your consumption as much as is tolerable . Avoid processed foods o These foods are generally stocked in the middle of the grocery store. Focus on shopping on the perimeter of the grocery.  . Avoid Meat  o We recommend following a plant-based diet at [facility]. Thus, we recommend avoiding meat as a general rule. Consider eating beans, legumes, eggs, and/or dairy products for regular protein sources o If you plan on eating meat limit to 4 ounces of meat at a time and choose lean options such as Fish, chicken, turkey. Avoid red meat intake such as pork and/or steak What to Include . Vegetables o GREEN LEAFY VEGETABLES: Kale, spinach, mustard greens, collard greens, cabbage, broccoli, etc. o OTHER: Asparagus, cauliflower, eggplant, carrots, peas, Brussel sprouts, tomatoes, bell peppers, zucchini, beets, cucumbers, etc. . Grains, seeds, and legumes o Beans: kidney beans, black eyed peas, garbanzo beans, black beans, pinto beans, etc. o Whole, unrefined grains: brown rice, barley, bulgur, oatmeal, etc. . Healthy fats  o Avoid highly processed fats such as vegetable oil o Examples of healthy fats: avocado, olives, virgin olive oil, dark chocolate (?72% Cocoa), nuts (peanuts, almonds, walnuts, cashews, pecans, etc.) . None to Low  Intake of Animal Sources of Protein o Meat sources: chicken, turkey, salmon, tuna. Limit to 4 ounces of meat at one time. o Consider limiting dairy sources, but when choosing dairy focus on: PLAIN Greek yogurt, cottage cheese, high-protein milk . Fruit o Choose berries  When to Eat . Intermittent Fasting: o Choosing not to eat for a specific time period, but DO FOCUS ON HYDRATION when fasting o Multiple Techniques: - Time Restricted Eating: eat 3 meals in a day, each meal lasting no more than 60 minutes, no snacks between meals - 16-18 hour fast: fast for 16 to 18 hours up to 7 days a week. Often suggested to start with 2-3 nonconsecutive days per week.  . Remember the time you sleep is counted as fasting.  . Examples of eating schedule: Fast from 7:00pm-11:00am. Eat between 11:00am-7:00pm.  - 24-hour fast: fast for 24 hours up to every other day. Often suggested to start with 1 day per week . Remember the time you sleep is counted as fasting . Examples of eating schedule:  o Eating day: eat 2-3 meals on your eating day. If doing 2 meals, each meal should last no more than 90 minutes. If doing 3 meals, each meal should last no more than 60 minutes. Finish last meal by 7:00pm. o Fasting day: Fast until 7:00pm.  o IF YOU FEEL UNWELL FOR ANY REASON/IN ANY WAY WHEN FASTING, STOP FASTING BY EATING A NUTRITIOUS SNACK OR LIGHT MEAL o ALWAYS FOCUS ON HYDRATION DURING FASTS - Acceptable Hydration sources: water, broths, tea/coffee (black tea/coffee is best but using a small amount of whole-fat dairy products in coffee/tea is acceptable).  -   Poor Hydration Sources: anything with sugar or artificial sweeteners added to it  These recommendations have been developed for patients that are actively receiving medical care from either Dr. [facility] or Alie Hardgrove, DNP, NP-C at [facility]. These recommendations are developed for patients with specific medical conditions and are not meant to be  distributed or used by others that are not actively receiving care from either provider listed above at [facility]. It is not appropriate to participate in the above eating plans without proper medical supervision.   Reference: Fung, J. The obesity code. [place]: [place]; 2016.   
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
## 2020-03-05 ENCOUNTER — Other Ambulatory Visit (INDEPENDENT_AMBULATORY_CARE_PROVIDER_SITE_OTHER): Payer: Self-pay | Admitting: Nurse Practitioner

## 2020-03-05 LAB — CBC WITH DIFFERENTIAL/PLATELET
Absolute Monocytes: 505 cells/uL (ref 200–950)
Basophils Absolute: 26 cells/uL (ref 0–200)
Basophils Relative: 0.3 %
Eosinophils Absolute: 70 cells/uL (ref 15–500)
Eosinophils Relative: 0.8 %
HCT: 39.9 % (ref 35.0–45.0)
Hemoglobin: 12.7 g/dL (ref 11.7–15.5)
Lymphs Abs: 2567 cells/uL (ref 850–3900)
MCH: 25.7 pg — ABNORMAL LOW (ref 27.0–33.0)
MCHC: 31.8 g/dL — ABNORMAL LOW (ref 32.0–36.0)
MCV: 80.8 fL (ref 80.0–100.0)
MPV: 11.4 fL (ref 7.5–12.5)
Monocytes Relative: 5.8 %
Neutro Abs: 5533 cells/uL (ref 1500–7800)
Neutrophils Relative %: 63.6 %
Platelets: 299 10*3/uL (ref 140–400)
RBC: 4.94 10*6/uL (ref 3.80–5.10)
RDW: 13.4 % (ref 11.0–15.0)
Total Lymphocyte: 29.5 %
WBC: 8.7 10*3/uL (ref 3.8–10.8)

## 2020-03-05 LAB — COMPLETE METABOLIC PANEL WITH GFR
AG Ratio: 1.3 (calc) (ref 1.0–2.5)
ALT: 23 U/L (ref 6–29)
AST: 19 U/L (ref 10–30)
Albumin: 4.1 g/dL (ref 3.6–5.1)
Alkaline phosphatase (APISO): 77 U/L (ref 31–125)
BUN: 12 mg/dL (ref 7–25)
CO2: 28 mmol/L (ref 20–32)
Calcium: 9.6 mg/dL (ref 8.6–10.2)
Chloride: 104 mmol/L (ref 98–110)
Creat: 0.84 mg/dL (ref 0.50–1.10)
GFR, Est African American: 104 mL/min/{1.73_m2} (ref >=60)
GFR, Est Non African American: 89 mL/min/{1.73_m2} (ref >=60)
Globulin: 3.1 g/dL (calc) (ref 1.9–3.7)
Glucose, Bld: 95 mg/dL (ref 65–99)
Potassium: 4.4 mmol/L (ref 3.5–5.3)
Sodium: 141 mmol/L (ref 135–146)
Total Bilirubin: 0.3 mg/dL (ref 0.2–1.2)
Total Protein: 7.2 g/dL (ref 6.1–8.1)

## 2020-03-05 LAB — LIPID PANEL
Cholesterol: 244 mg/dL — ABNORMAL HIGH (ref <200)
HDL: 44 mg/dL — ABNORMAL LOW (ref >=50)
LDL Cholesterol (Calc): 146 mg/dL (calc) — ABNORMAL HIGH
Non-HDL Cholesterol (Calc): 200 mg/dL (calc) — ABNORMAL HIGH (ref <130)
Total CHOL/HDL Ratio: 5.5 (calc) — ABNORMAL HIGH (ref <5.0)
Triglycerides: 350 mg/dL — ABNORMAL HIGH (ref <150)

## 2020-03-05 LAB — TSH: TSH: 1.96 mIU/L

## 2020-03-05 LAB — VITAMIN D 25 HYDROXY (VIT D DEFICIENCY, FRACTURES): Vit D, 25-Hydroxy: 12 ng/mL — ABNORMAL LOW (ref 30–100)

## 2020-03-05 LAB — HEMOGLOBIN A1C
Hgb A1c MFr Bld: 5.8 % of total Hgb — ABNORMAL HIGH (ref <5.7)
Mean Plasma Glucose: 120 (calc)
eAG (mmol/L): 6.6 (calc)

## 2020-03-05 MED ORDER — CHOLECALCIFEROL 125 MCG (5000 UT) PO TABS: 1.0000 | ORAL_TABLET | Freq: Every day | ORAL | 0 refills | Status: DC

## 2020-04-09 DIAGNOSIS — Z6841 Body Mass Index (BMI) 40.0 and over, adult: Secondary | ICD-10-CM | POA: Diagnosis not present

## 2020-04-09 DIAGNOSIS — K2100 Gastro-esophageal reflux disease with esophagitis, without bleeding: Secondary | ICD-10-CM | POA: Diagnosis not present

## 2020-04-09 DIAGNOSIS — I890 Lymphedema, not elsewhere classified: Secondary | ICD-10-CM | POA: Diagnosis not present

## 2020-04-13 ENCOUNTER — Other Ambulatory Visit (HOSPITAL_COMMUNITY): Payer: Self-pay | Admitting: General Surgery

## 2020-04-13 ENCOUNTER — Other Ambulatory Visit: Payer: Self-pay | Admitting: General Surgery

## 2020-04-13 DIAGNOSIS — K2100 Gastro-esophageal reflux disease with esophagitis, without bleeding: Secondary | ICD-10-CM

## 2020-04-16 ENCOUNTER — Ambulatory Visit (HOSPITAL_COMMUNITY)
Admission: RE | Admit: 2020-04-16 | Discharge: 2020-04-16 | Disposition: A | Discharge status: Home / Self Care | Payer: Medicare HMO | Source: Ambulatory Visit | Attending: General Surgery | Admitting: General Surgery

## 2020-04-16 ENCOUNTER — Other Ambulatory Visit (HOSPITAL_COMMUNITY): Payer: Self-pay | Admitting: General Surgery

## 2020-04-16 ENCOUNTER — Other Ambulatory Visit: Payer: Self-pay

## 2020-04-16 DIAGNOSIS — K2100 Gastro-esophageal reflux disease with esophagitis, without bleeding: Secondary | ICD-10-CM | POA: Insufficient documentation

## 2020-04-16 DIAGNOSIS — Z01818 Encounter for other preprocedural examination: Secondary | ICD-10-CM | POA: Diagnosis not present

## 2020-04-16 DIAGNOSIS — E661 Drug-induced obesity: Secondary | ICD-10-CM | POA: Diagnosis not present

## 2020-04-16 DIAGNOSIS — K219 Gastro-esophageal reflux disease without esophagitis: Secondary | ICD-10-CM | POA: Diagnosis not present

## 2020-04-30 ENCOUNTER — Telehealth (INDEPENDENT_AMBULATORY_CARE_PROVIDER_SITE_OTHER): Payer: Self-pay

## 2020-04-30 ENCOUNTER — Encounter (INDEPENDENT_AMBULATORY_CARE_PROVIDER_SITE_OTHER): Payer: Self-pay | Admitting: Nurse Practitioner

## 2020-04-30 ENCOUNTER — Telehealth (INDEPENDENT_AMBULATORY_CARE_PROVIDER_SITE_OTHER): Payer: Medicare HMO | Admitting: Nurse Practitioner

## 2020-04-30 ENCOUNTER — Other Ambulatory Visit (INDEPENDENT_AMBULATORY_CARE_PROVIDER_SITE_OTHER): Payer: Self-pay | Admitting: Nurse Practitioner

## 2020-04-30 VITALS — Ht 68.5 in

## 2020-04-30 DIAGNOSIS — J069 Acute upper respiratory infection, unspecified: Secondary | ICD-10-CM

## 2020-04-30 DIAGNOSIS — Z1159 Encounter for screening for other viral diseases: Secondary | ICD-10-CM | POA: Diagnosis not present

## 2020-04-30 MED ORDER — FLUTICASONE PROPIONATE 50 MCG/ACT NA SUSP: 2.0000 | Freq: Every day | NASAL | 6 refills | Status: DC

## 2020-04-30 MED ORDER — BENZONATATE 100 MG PO CAPS: 100.0000 mg | ORAL_CAPSULE | Freq: Two times a day (BID) | ORAL | 0 refills | Status: DC | PRN
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
## 2020-04-30 NOTE — Telephone Encounter (Signed)
FYI

## 2020-04-30 NOTE — Telephone Encounter (Signed)
Yes I have sent the Rx to [facility] pharmacy in [place].

## 2020-04-30 NOTE — Telephone Encounter (Signed)
Called patient and let her know that both prescriptions have been sent to the pharmacy for the patient. Patient verbalized an understanding.

## 2020-04-30 NOTE — Telephone Encounter (Signed)
Error

## 2020-04-30 NOTE — Telephone Encounter (Signed)
Patient called and left a detailed voice message and said that she did get her Covid test and wanted to know if she could pick up her prescription?   Please advise.

## 2020-05-07 ENCOUNTER — Telehealth (INDEPENDENT_AMBULATORY_CARE_PROVIDER_SITE_OTHER): Payer: Medicare HMO | Admitting: Internal Medicine

## 2020-05-07 ENCOUNTER — Encounter (INDEPENDENT_AMBULATORY_CARE_PROVIDER_SITE_OTHER): Payer: Self-pay | Admitting: Internal Medicine

## 2020-05-07 DIAGNOSIS — R0600 Dyspnea, unspecified: Secondary | ICD-10-CM

## 2020-05-07 DIAGNOSIS — R059 Cough, unspecified: Secondary | ICD-10-CM

## 2020-05-07 MED ORDER — AZITHROMYCIN 250 MG PO TABS: ORAL_TABLET | ORAL | 0 refills | Status: DC
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
## 2020-05-08 ENCOUNTER — Emergency Department (HOSPITAL_COMMUNITY): Payer: Medicare HMO

## 2020-05-08 ENCOUNTER — Other Ambulatory Visit: Payer: Self-pay

## 2020-05-08 ENCOUNTER — Encounter (HOSPITAL_COMMUNITY): Payer: Self-pay | Admitting: *Deleted

## 2020-05-08 ENCOUNTER — Emergency Department (HOSPITAL_COMMUNITY)
Admission: EM | Admit: 2020-05-08 | Discharge: 2020-05-08 | Disposition: A | Discharge status: Home / Self Care | Payer: Medicare HMO | Attending: Emergency Medicine | Admitting: Emergency Medicine

## 2020-05-08 DIAGNOSIS — R059 Cough, unspecified: Secondary | ICD-10-CM | POA: Diagnosis not present

## 2020-05-08 DIAGNOSIS — Z87891 Personal history of nicotine dependence: Secondary | ICD-10-CM | POA: Insufficient documentation

## 2020-05-08 DIAGNOSIS — U071 COVID-19: Secondary | ICD-10-CM | POA: Diagnosis not present

## 2020-05-08 DIAGNOSIS — Z20822 Contact with and (suspected) exposure to covid-19: Secondary | ICD-10-CM

## 2020-05-08 HISTORY — DX: Polycystic ovarian syndrome: E28.2

## 2020-05-08 LAB — POC SARS CORONAVIRUS 2 AG -  ED: SARS Coronavirus 2 Ag: NEGATIVE

## 2020-05-08 MED ORDER — IBUPROFEN 400 MG PO TABS
600.0000 mg | ORAL_TABLET | Freq: Once | ORAL | Status: AC
  Administered 2020-05-08: 600 mg via ORAL
  Filled 2020-05-08: qty 2

## 2020-05-08 MED ORDER — ONDANSETRON 4 MG PO TBDP: ORAL_TABLET | ORAL | 0 refills | Status: DC

## 2020-05-08 MED ORDER — ACETAMINOPHEN 500 MG PO TABS
1000.0000 mg | ORAL_TABLET | Freq: Once | ORAL | Status: AC
  Administered 2020-05-08: 1000 mg via ORAL
  Filled 2020-05-08: qty 2
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
## 2020-05-08 NOTE — ED Triage Notes (Signed)
Pt with cough since 1/5, covid test negative on 1/6. Today with diarrhea, chills and HA.

## 2020-05-08 NOTE — Discharge Instructions (Signed)
You have a COVID test pending, you will be called if results are positive, you can also check results on MyChart.  Please continue to quarantine at home and monitor your symptoms closely. You chest x-ray was clear. Antibiotics are not helpful in treating viral infection, the virus should run its course in about 10-14 days. Please make sure you are drinking plenty of fluids. You can treat your symptoms supportively with tylenol for fevers and pains, and over the counter cough syrups like Mucinex and throat lozenges to help with cough.  Zofran as needed for nausea or vomiting.  If your symptoms are not improving please follow up with you Primary doctor.   I recommend that you purchase a home pulse ox to help better monitor your oxygen at home, if you start to have increased work of breathing or shortness of breath or your oxygen drops below 90% please immediately return to the hospital for reevaluation.  If you develop persistent fevers, shortness of breath or difficulty breathing, chest pain, severe headache and neck pain, persistent nausea and vomiting or other new or concerning symptoms return to the Emergency department.

## 2020-05-08 NOTE — ED Notes (Signed)
Pt observed by greeter leaving prior  To DC paperwork received   DC instruction at desk with rx

## 2020-05-08 NOTE — ED Notes (Signed)
After initial POC covid neg  2nd covid to lab

## 2020-05-09 LAB — SARS CORONAVIRUS 2 (TAT 6-24 HRS): SARS Coronavirus 2: POSITIVE — AB

## 2020-05-14 ENCOUNTER — Encounter (INDEPENDENT_AMBULATORY_CARE_PROVIDER_SITE_OTHER): Payer: Self-pay

## 2020-05-14 ENCOUNTER — Encounter (INDEPENDENT_AMBULATORY_CARE_PROVIDER_SITE_OTHER): Payer: Medicare HMO | Admitting: Nurse Practitioner

## 2020-05-18 ENCOUNTER — Ambulatory Visit: Payer: Medicare HMO | Admitting: Skilled Nursing Facility1

## 2020-05-18 ENCOUNTER — Other Ambulatory Visit: Payer: Medicare HMO

## 2020-05-18 DIAGNOSIS — Z20822 Contact with and (suspected) exposure to covid-19: Secondary | ICD-10-CM

## 2020-05-19 LAB — SARS-COV-2, NAA 2 DAY TAT

## 2020-05-19 LAB — NOVEL CORONAVIRUS, NAA: SARS-CoV-2, NAA: NOT DETECTED

## 2020-05-20 ENCOUNTER — Encounter: Payer: Self-pay | Admitting: Skilled Nursing Facility1

## 2020-05-20 ENCOUNTER — Other Ambulatory Visit: Payer: Self-pay

## 2020-05-20 ENCOUNTER — Encounter: Payer: Medicare HMO | Attending: General Surgery | Admitting: Skilled Nursing Facility1

## 2020-05-20 DIAGNOSIS — F32A Depression, unspecified: Secondary | ICD-10-CM | POA: Diagnosis not present

## 2020-05-20 DIAGNOSIS — Z6841 Body Mass Index (BMI) 40.0 and over, adult: Secondary | ICD-10-CM | POA: Diagnosis not present

## 2020-05-20 DIAGNOSIS — Z713 Dietary counseling and surveillance: Secondary | ICD-10-CM | POA: Diagnosis not present

## 2020-05-20 DIAGNOSIS — I890 Lymphedema, not elsewhere classified: Secondary | ICD-10-CM | POA: Diagnosis not present

## 2020-05-20 DIAGNOSIS — F419 Anxiety disorder, unspecified: Secondary | ICD-10-CM | POA: Diagnosis not present

## 2020-05-20 DIAGNOSIS — M549 Dorsalgia, unspecified: Secondary | ICD-10-CM | POA: Diagnosis not present

## 2020-05-20 DIAGNOSIS — K2100 Gastro-esophageal reflux disease with esophagitis, without bleeding: Secondary | ICD-10-CM | POA: Insufficient documentation

## 2020-05-20 DIAGNOSIS — E119 Type 2 diabetes mellitus without complications: Secondary | ICD-10-CM | POA: Insufficient documentation

## 2020-05-20 NOTE — Progress Notes (Signed)
Nutrition Assessment for Bariatric Surgery Medical Nutrition Therapy Appt Start Time: 10:28 End Time: 11:30  Patient was seen on 05/20/2020 for Pre-Operative Nutrition Assessment. Letter of approval faxed to [facility] bariatric surgery program coordinator on 05/20/2020  Referral stated Supervised Weight Loss (SWL) visits needed: 6  Planned surgery: RYGB Pt expectation of surgery: to cut her weight in half Pt expectation of dietitian: none identified     NUTRITION ASSESSMENT   Anthropometrics  Start weight at [facility]: 374.3 lbs (date: 05/20/2020)  Height: 69 in BMI: 55.26 kg/m2     Clinical  Medical hx: GDM, lymphedema  Medications: vitamin D Labs: vitamin D 12, A1C 5.8, cholesterol 244, triglycerides 350 Notable signs/symptoms: N/A Any previous deficiencies? Iron, vitamin D (12)  Micronutrient Nutrition Focused Physical Exam: Hair: No issues observed Eyes: No issues observed Mouth: No issues observed Neck: No issues observed Nails: No issues observed Skin: No issues observed  Lifestyle & Dietary Hx  Pt states she just had her son 6 months ago and was in labor for 3 days.  Pt states she is currently doing weight watchers. Pt states her brother had the sleeve gastrectomy. Pt states her bother has been throwing up so she is having the RYGB to keep that from happening more often: Dietitian educated pt on this topic. Pt states her family growing up did not believe in mental health therapy. Pt states she tried therapy a couple of times but really has not done anything significant.  Pt states she is open to doing therapy now. Pt states she is getting [place]. Pt states she cannot move around as much as she wants to due to lymphedema. Pt states she tries to avoid red meat.   Pt states her fiancee does not want her to have the surgery and does not want her to lose weight for fear of her leaving him. Pt states her main motivation for  having the surgery is to play with her son. Pt states her fiancee eats all of his meals fast food.   Pt states she took some classes in [employer] but does not cook due to the new baby wanting to be with her.   24-Hr Dietary Recall First Meal: skipped Snack: nothing Second Meal: skipped Snack: nothing Third Meal: spagetti or chicken or [place] + broccoli or cauliflower  Snack:  Beverages: gatorade, body armour, water   Estimated Energy Needs Calories: 1600   NUTRITION DIAGNOSIS  Overweight/obesity ([place]-3.3) related to past poor dietary habits and physical inactivity as evidenced by patient w/ planned RYGB surgery following dietary guidelines for continued weight loss.    NUTRITION INTERVENTION  Nutrition counseling (C-1) and education (E-2) to facilitate bariatric surgery goals.   Pre-Op Goals Reviewed with the Patient . Track food and beverage intake (pen and paper, MyFitness Pal, Baritastic app, etc.) . Make healthy food choices while monitoring portion sizes . Consume 3 meals per day or try to eat every 3-5 hours . Avoid concentrated sugars and fried foods . Keep sugar & fat in the single digits per serving on food labels . Practice CHEWING your food (aim for applesauce consistency) . Practice not drinking 15 minutes before, during, and 30 minutes after each meal and snack . Avoid all carbonated beverages (ex: soda, sparkling beverages)  . Limit caffeinated beverages (ex: coffee, tea, energy drinks) . Avoid all sugar-sweetened beverages (ex: regular soda, sports drinks)  . Avoid alcohol  . Aim for 64-100 ounces of  FLUID daily (with at least half of fluid intake being plain water)  . Aim for at least 60-80 grams of PROTEIN daily . Look for a liquid protein source that contains ?15 g protein and ?5 g carbohydrate (ex: shakes, drinks, shots) . Make a list of non-food related activities . Physical activity is an important part of a healthy lifestyle so keep it moving! The goal  is to reach 150 minutes of exercise per week, including cardiovascular and weight baring activity. . Call [facility] today for counseling . eat breakfast, lunch, and dinner  . Switch to body armour lyte and Gatorade zero  *Goals that are bolded indicate the pt would like to start working towards these  Handouts Provided Include  . Bariatric Surgery handouts (Nutrition Visits, Pre-Op Goals, Protein Shakes, Vitamins & Minerals)  Learning Style & Readiness for Change Teaching method utilized: Visual & Auditory  Demonstrated degree of understanding via: Teach Back  Readiness Level: Pre-contemplative  Barriers to learning/adherence to lifestyle change: previous unresolved traumas/unsupportive fiancee  RD's Notes for Next Visit . Assess pts adherence to chosen goals      MONITORING & EVALUATION Dietary intake, weekly physical activity, body weight, and pre-op goals reached at next nutrition visit.    Next Steps  Patient is to follow up at [facility] for Pre-Op Class >2 weeks before surgery for further nutrition education.

## 2020-05-27 ENCOUNTER — Other Ambulatory Visit: Payer: Medicare HMO

## 2020-06-03 ENCOUNTER — Telehealth (INDEPENDENT_AMBULATORY_CARE_PROVIDER_SITE_OTHER): Payer: Self-pay

## 2020-06-03 DIAGNOSIS — I890 Lymphedema, not elsewhere classified: Secondary | ICD-10-CM

## 2020-06-03 NOTE — Telephone Encounter (Signed)
Yes please! I will place order so that it is in the system.

## 2020-06-03 NOTE — Telephone Encounter (Signed)
[name] is calling for [name] and said they have been waiting on the physical therapy since Dec and haven't heard anything. If you are wanting PT that treats lymphadema the [facility] treats lymphadema. Want me to enter a referral there?

## 2020-06-09 ENCOUNTER — Encounter: Payer: Medicare HMO | Attending: General Surgery | Admitting: Skilled Nursing Facility1

## 2020-06-09 ENCOUNTER — Other Ambulatory Visit: Payer: Self-pay

## 2020-06-09 NOTE — Progress Notes (Signed)
Supervised Weight Loss Visit Bariatric Nutrition Education*  Planned Surgery: RYGB   1 out of 6 SWL Appointments    NUTRITION ASSESSMENT  Anthropometrics  Start weight at [facility]: 374.3 lbs (date: 05/20/2020)  Weight: 381.6 pounds Height: 69 in BMI: 56.35 kg/m2     Clinical  Medical hx: GDM, lymphedema  Medications: vitamin D Labs: vitamin D 12, A1C 5.8, cholesterol 244, triglycerides 350 Notable signs/symptoms: N/A Any previous deficiencies? Iron, vitamin D (12)  Lifestyle & Dietary Hx  Pt states she does not know where her weight gain came from arrived having gained about 7.3 pounds: pt did pick up a new habit of eating pickles so perhaps the sodium retained fluid for her als she has lymphdema.  Pt state she has been eating more grilled stuff and  avoiding fast food.  Pt state she called her insurance company to find a Mental health provider which advised her she cannot take her until March. Pt states she was not abe to get married due to covid but will do it on the 22nd. Pt states she tried some healthier recipes for superbowl. Pt state she is doing weight watcher sand tracks her food on there. Pt state she is trying to get ion the habit of not drinking with meals.   Pt states she cannot move around as much as she wants to due to lymphedema. Pt states she tries to avoid red meat.   Pt states her fiancee does not want her to have the surgery and does not want her to lose weight for fear of her leaving him. Pt states her main motivation for having the surgery is to play with her son. Pt states her fiancee eats all of his meals fast food.   Pt states she took some classes in [employer] but does not cook due to the new baby wanting to be with her.   Estimated daily fluid intake: oz Supplements:  Current average weekly physical activity:   24-Hr Dietary Recall First Meal: 2 eggs + spinach  Snack: pickles Second Meal: yogurt dip + chips Snack: pickles or apple Third Meal: 6  in subway sandwich Snack: pickles Beverages: water, 3-4 coffee + sugar free creamer, body armour lyte  Estimated Energy Needs Calories: 1600   NUTRITION DIAGNOSIS  Overweight/obesity ([place]-3.3) related to past poor dietary habits and physical inactivity as evidenced by patient w/ planned RYGB surgery following dietary guidelines for continued weight loss.   NUTRITION INTERVENTION  Nutrition counseling (C-1) and education (E-2) to facilitate bariatric surgery goals.  Pre-Op Goals Progress & New Goals . Continue: Call [facility] today for counseling . Continue: eat breakfast, lunch, and dinner  . Continue: Switch to body armour lyte and Gatorade zero . NEW: Limit to 3 fruit servings per day: 1 apple + 1 orange + 3/4 cup berries . NEW: limit coffee  To 2 cups . NEW: chew each bite until applesauce consistency   Handouts Previously Provided Include   Detailed MyPlate  Learning Style & Readiness for Change Teaching method utilized: Visual & Auditory  Demonstrated degree of understanding via: Teach Back  Readiness Level: Action Barriers to learning/adherence to lifestyle change: unknown at this time  RD's Notes for next Visit  . Assess pts adherecne to chosen goals    MONITORING & EVALUATION Dietary intake, weekly physical activity, body weight, and pre-op goals in 1 month.   Next Steps  Patient is to return to [facility] in 1 month

## 2020-06-18 ENCOUNTER — Encounter (INDEPENDENT_AMBULATORY_CARE_PROVIDER_SITE_OTHER): Payer: Self-pay | Admitting: Nurse Practitioner

## 2020-06-18 ENCOUNTER — Ambulatory Visit (INDEPENDENT_AMBULATORY_CARE_PROVIDER_SITE_OTHER): Payer: Self-pay | Admitting: Nurse Practitioner

## 2020-06-18 ENCOUNTER — Other Ambulatory Visit: Payer: Self-pay

## 2020-06-18 VITALS — BP 122/72 | HR 103 | Temp 97.5°F | Ht 68.5 in | Wt 385.4 lb

## 2020-06-18 DIAGNOSIS — Z0000 Encounter for general adult medical examination without abnormal findings: Secondary | ICD-10-CM

## 2020-06-18 NOTE — Patient Instructions (Signed)
  [name] , Thank you for taking time to come for your Medicare Wellness Visit. I appreciate your ongoing commitment to your health goals. Please review the following plan we discussed and let me know if I can assist you in the future.   These are the goals we discussed: Goals    . DIET - INCREASE WATER INTAKE    . Exercise 3x per week (30 min per time)     Recommend starting a routine exercise program at least 3 days a week for 30-45 minutes at a time as tolerated.         This is a list of the screening recommended for you and due dates:  Health Maintenance  Topic Date Due  . Urine Protein Check  Never done  . Flu Shot  07/23/2020*  . Pap Smear  01/01/2022  . Tetanus Vaccine  06/25/2029  . COVID-19 Vaccine  Completed  .  Hepatitis C: One time screening is recommended by Center for Disease Control  (CDC) for  adults born from [birth year] through 1967.   Completed  . HIV Screening  Completed  *Topic was postponed. The date shown is not the original due date.
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
## 2020-06-30 ENCOUNTER — Other Ambulatory Visit: Payer: Self-pay

## 2020-06-30 ENCOUNTER — Encounter (HOSPITAL_COMMUNITY): Payer: Self-pay | Admitting: Physical Therapy

## 2020-06-30 ENCOUNTER — Ambulatory Visit (HOSPITAL_COMMUNITY): Payer: Medicare HMO | Attending: Nurse Practitioner | Admitting: Physical Therapy

## 2020-06-30 DIAGNOSIS — I890 Lymphedema, not elsewhere classified: Secondary | ICD-10-CM | POA: Diagnosis not present
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
## 2020-07-01 ENCOUNTER — Ambulatory Visit (HOSPITAL_COMMUNITY): Payer: Medicare HMO | Admitting: Physical Therapy

## 2020-07-01 ENCOUNTER — Telehealth (HOSPITAL_COMMUNITY): Payer: Self-pay | Admitting: Physical Therapy

## 2020-07-01 ENCOUNTER — Encounter (HOSPITAL_COMMUNITY): Payer: Self-pay | Admitting: Physical Therapy

## 2020-07-01 ENCOUNTER — Other Ambulatory Visit: Payer: Self-pay

## 2020-07-01 DIAGNOSIS — I890 Lymphedema, not elsewhere classified: Secondary | ICD-10-CM

## 2020-07-01 NOTE — Telephone Encounter (Signed)
No Show #1  Called and left message explaining that this will be pt first no show.   Therapist let pt know that her next visit is tomorrow at 8:30  Virgina Organ, PT CLT [phone]
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
## 2020-07-02 ENCOUNTER — Ambulatory Visit (HOSPITAL_COMMUNITY): Payer: Medicare HMO | Admitting: Physical Therapy

## 2020-07-02 DIAGNOSIS — I890 Lymphedema, not elsewhere classified: Secondary | ICD-10-CM
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
## 2020-07-07 ENCOUNTER — Other Ambulatory Visit: Payer: Self-pay

## 2020-07-07 ENCOUNTER — Encounter (HOSPITAL_COMMUNITY): Payer: Self-pay | Admitting: Physical Therapy

## 2020-07-07 ENCOUNTER — Ambulatory Visit (HOSPITAL_COMMUNITY): Payer: Medicare HMO | Admitting: Physical Therapy

## 2020-07-07 DIAGNOSIS — I890 Lymphedema, not elsewhere classified: Secondary | ICD-10-CM | POA: Diagnosis not present
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
## 2020-07-08 ENCOUNTER — Other Ambulatory Visit: Payer: Self-pay

## 2020-07-08 ENCOUNTER — Ambulatory Visit (HOSPITAL_COMMUNITY): Payer: Medicare HMO

## 2020-07-08 ENCOUNTER — Encounter: Payer: Medicare HMO | Attending: General Surgery | Admitting: Skilled Nursing Facility1

## 2020-07-08 NOTE — Progress Notes (Signed)
Supervised Weight Loss Visit Bariatric Nutrition Education*  Planned Surgery: RYGB   2 out of 6 SWL Appointments    NUTRITION ASSESSMENT  Anthropometrics  Start weight at [facility]: 374.3 lbs (date: 05/20/2020)  Weight: 376.6 pounds   Height: 69 in BMI: 56.35 kg/m2     Clinical  Medical hx: GDM, lymphedema  Medications: vitamin D Labs: vitamin D 12, A1C 5.8, cholesterol 244, triglycerides 350 Notable signs/symptoms: N/A Any previous deficiencies? Iron, vitamin D (12)  Lifestyle & Dietary Hx  Pt states she took some classes in culinary arts but does not cook due to the new baby wanting to be with her.   Pt arrives with her new husband and baby. Pt states she is in pain from lymphodema and got her legs wrapped. Pt states she did get married. Pt states she switched to coconut water from sweet tea and soda. Pt states she gets her legs wrapped 3 days a week at 8:30am and then picks her baby up from her mom which is round 10am and then picks her husband at 3pm. Pt state she has not been able to get in touch with the counselor. Pt states she stopped eating the pickles. Pt states she subscribes to [employer] to help her be active and with meal ideas.   Estimated daily fluid intake: oz Supplements:  Current average weekly physical activity: Wants to start this month  24-Hr Dietary Recall: up at 6am First Meal 11am-12: 2 eggs + spinach or chicken sandwich (fast food) Snack: apple + peanut butter Second Meal 3pm: yogurt dip + chips Snack: apple or rice cakes  Third Meal: 6 in subway sandwich Snack: Beverages: water, 2 coffee + sugar free creamer, body armour lyte  Estimated Energy Needs Calories: 1600  NUTRITION DIAGNOSIS  Overweight/obesity ([place]-3.3) related to past poor dietary habits and physical inactivity as evidenced by patient w/ planned RYGB surgery following dietary guidelines for continued weight loss.   NUTRITION INTERVENTION  Nutrition counseling (C-1) and education  (E-2) to facilitate bariatric surgery goals.  Pre-Op Goals Progress & New Goals . Continue: Call [facility] today for counseling: keep working on this . Continue: eat breakfast, lunch, and dinner  . Continue: Switch to body armour lyte and Gatorade zero . continue: Limit to 3 fruit servings per day: 1 apple + 1 orange + 3/4 cup berries . continue: limit coffee  To 2 cups . continue: chew each bite until applesauce consistency . NEW: eat breakfast when your son eats breakfast   . NEW: limit your rice cakes to 2 per day . NEW: do 2 workout videos per week  Handouts Previously Provided Include   Detailed MyPlate  Learning Style & Readiness for Change Teaching method utilized: Visual & Auditory  Demonstrated degree of understanding via: Teach Back  Readiness Level: Action Barriers to learning/adherence to lifestyle change: unknown at this time  RD's Notes for next Visit  . Assess pts adherecne to chosen goals    MONITORING & EVALUATION Dietary intake, weekly physical activity, body weight, and pre-op goals in 1 month.   Next Steps  Patient is to return to [facility] in 1 month

## 2020-07-09 ENCOUNTER — Ambulatory Visit (HOSPITAL_COMMUNITY): Payer: Medicare HMO

## 2020-07-09 ENCOUNTER — Encounter (HOSPITAL_COMMUNITY): Payer: Self-pay

## 2020-07-09 DIAGNOSIS — I890 Lymphedema, not elsewhere classified: Secondary | ICD-10-CM | POA: Diagnosis not present
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
## 2020-07-10 ENCOUNTER — Telehealth (HOSPITAL_COMMUNITY): Payer: Self-pay

## 2020-07-10 ENCOUNTER — Ambulatory Visit (HOSPITAL_COMMUNITY): Payer: Medicare HMO

## 2020-07-10 NOTE — Telephone Encounter (Signed)
No show, called and left message concerning missed apt.  Reminded next apt date and time with contact number included for questions/concerns or needs to reschedule/cancel further apts.    Becky Sax, LPTA/CLT; [place]e [place]ack [phone]

## 2020-07-14 ENCOUNTER — Ambulatory Visit (HOSPITAL_COMMUNITY): Payer: Medicare HMO | Admitting: Physical Therapy

## 2020-07-14 ENCOUNTER — Other Ambulatory Visit: Payer: Self-pay

## 2020-07-14 DIAGNOSIS — I890 Lymphedema, not elsewhere classified: Secondary | ICD-10-CM | POA: Diagnosis not present
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
## 2020-07-15 ENCOUNTER — Ambulatory Visit (HOSPITAL_COMMUNITY): Payer: Medicare HMO

## 2020-07-15 ENCOUNTER — Encounter (HOSPITAL_COMMUNITY): Payer: Self-pay

## 2020-07-15 DIAGNOSIS — I890 Lymphedema, not elsewhere classified: Secondary | ICD-10-CM
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
## 2020-07-17 ENCOUNTER — Other Ambulatory Visit: Payer: Self-pay

## 2020-07-17 ENCOUNTER — Ambulatory Visit (HOSPITAL_COMMUNITY): Payer: Medicare HMO | Admitting: Physical Therapy

## 2020-07-17 ENCOUNTER — Encounter (HOSPITAL_COMMUNITY): Payer: Self-pay | Admitting: Physical Therapy

## 2020-07-17 DIAGNOSIS — I890 Lymphedema, not elsewhere classified: Secondary | ICD-10-CM
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
## 2020-07-21 ENCOUNTER — Other Ambulatory Visit: Payer: Self-pay

## 2020-07-21 ENCOUNTER — Encounter (HOSPITAL_COMMUNITY): Payer: Self-pay

## 2020-07-21 ENCOUNTER — Ambulatory Visit (HOSPITAL_COMMUNITY): Payer: Medicare HMO

## 2020-07-21 DIAGNOSIS — I890 Lymphedema, not elsewhere classified: Secondary | ICD-10-CM
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
## 2020-07-22 ENCOUNTER — Ambulatory Visit (HOSPITAL_COMMUNITY): Payer: Medicare HMO | Admitting: Physical Therapy

## 2020-07-22 ENCOUNTER — Encounter (HOSPITAL_COMMUNITY): Payer: Self-pay | Admitting: Physical Therapy

## 2020-07-22 DIAGNOSIS — I890 Lymphedema, not elsewhere classified: Secondary | ICD-10-CM
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
## 2020-07-23 ENCOUNTER — Encounter (HOSPITAL_COMMUNITY): Payer: Self-pay | Admitting: Physical Therapy

## 2020-07-23 NOTE — Therapy (Signed)
[facility] [address] [place], [place], [address] Phone: [phone]   Fax:  [phone]  Patient Details  Name: [name] MRN: [id number] Date of Birth: Jan 27, 1986 Referring Provider:  No ref. provider found  Encounter Date: 07/23/2020   Therapist send order for custom fit knee hight compression to [facility] Compression  Virgina Organ, PT CLT [phone] 07/23/2020, 10:56 AM  [facility] [address] [place], [place], [address] Phone: [phone]   Fax:  [phone]

## 2020-07-24 ENCOUNTER — Other Ambulatory Visit: Payer: Self-pay

## 2020-07-24 ENCOUNTER — Ambulatory Visit (HOSPITAL_COMMUNITY): Payer: Medicare HMO | Attending: Nurse Practitioner | Admitting: Physical Therapy

## 2020-07-24 ENCOUNTER — Encounter (HOSPITAL_COMMUNITY): Payer: Self-pay | Admitting: Physical Therapy

## 2020-07-24 DIAGNOSIS — I890 Lymphedema, not elsewhere classified: Secondary | ICD-10-CM | POA: Insufficient documentation
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
## 2020-07-27 ENCOUNTER — Telehealth (INDEPENDENT_AMBULATORY_CARE_PROVIDER_SITE_OTHER): Payer: Self-pay

## 2020-07-27 NOTE — Telephone Encounter (Signed)
Sarah with [employer] called and stated that they will be sending over faxed paperwork for [name] in regards to this patient. Sending as FYI to be on the look out for paperwork.

## 2020-07-28 ENCOUNTER — Ambulatory Visit (HOSPITAL_COMMUNITY): Payer: Medicare HMO

## 2020-07-28 ENCOUNTER — Encounter (HOSPITAL_COMMUNITY): Payer: Self-pay

## 2020-07-28 ENCOUNTER — Other Ambulatory Visit: Payer: Self-pay

## 2020-07-28 DIAGNOSIS — I890 Lymphedema, not elsewhere classified: Secondary | ICD-10-CM
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
## 2020-07-29 ENCOUNTER — Telehealth (HOSPITAL_COMMUNITY): Payer: Self-pay

## 2020-07-29 ENCOUNTER — Encounter (HOSPITAL_COMMUNITY): Payer: Self-pay

## 2020-07-29 ENCOUNTER — Ambulatory Visit (HOSPITAL_COMMUNITY): Payer: Medicare HMO

## 2020-07-29 DIAGNOSIS — I890 Lymphedema, not elsewhere classified: Secondary | ICD-10-CM
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
## 2020-07-29 NOTE — Telephone Encounter (Signed)
No show, called and was immediately directed to voicemail, left message concerning missed apt today.  Becky Sax, LPTA/CLT; [place] [phone]

## 2020-07-31 ENCOUNTER — Encounter (HOSPITAL_COMMUNITY): Payer: Self-pay

## 2020-07-31 ENCOUNTER — Ambulatory Visit (HOSPITAL_COMMUNITY): Payer: Medicare HMO

## 2020-07-31 ENCOUNTER — Other Ambulatory Visit: Payer: Self-pay

## 2020-07-31 DIAGNOSIS — I890 Lymphedema, not elsewhere classified: Secondary | ICD-10-CM
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
## 2020-08-03 ENCOUNTER — Other Ambulatory Visit: Payer: Self-pay

## 2020-08-03 ENCOUNTER — Ambulatory Visit (HOSPITAL_COMMUNITY): Payer: Medicare HMO | Admitting: Physical Therapy

## 2020-08-03 DIAGNOSIS — I890 Lymphedema, not elsewhere classified: Secondary | ICD-10-CM
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
## 2020-08-04 ENCOUNTER — Ambulatory Visit (HOSPITAL_COMMUNITY): Payer: Medicaid Other | Admitting: Clinical

## 2020-08-05 ENCOUNTER — Telehealth (HOSPITAL_COMMUNITY): Payer: Self-pay | Admitting: Physical Therapy

## 2020-08-05 ENCOUNTER — Encounter (HOSPITAL_COMMUNITY): Payer: Medicare HMO | Admitting: Physical Therapy

## 2020-08-05 NOTE — Telephone Encounter (Signed)
Pt did not show for scheduled appointment.  Called and left voicemail regarding missed appointment and reminder for next one on Friday at 8:30.  Also informed that therapist spoke to [place] at [facility] and she is to be measured for her compression garments next week.  Stressed importance of this appointment.   Lurena Nida, PTA/CLT [phone]

## 2020-08-07 ENCOUNTER — Encounter (HOSPITAL_COMMUNITY): Payer: Medicare HMO | Admitting: Physical Therapy

## 2020-08-10 ENCOUNTER — Ambulatory Visit: Payer: Medicare HMO | Admitting: Skilled Nursing Facility1

## 2020-08-12 ENCOUNTER — Other Ambulatory Visit: Payer: Self-pay

## 2020-08-12 ENCOUNTER — Encounter (HOSPITAL_COMMUNITY): Payer: Medicare HMO | Admitting: Physical Therapy

## 2020-08-12 ENCOUNTER — Ambulatory Visit (HOSPITAL_COMMUNITY): Payer: Medicare HMO | Admitting: Physical Therapy

## 2020-08-12 ENCOUNTER — Telehealth (HOSPITAL_COMMUNITY): Payer: Self-pay | Admitting: Physical Therapy

## 2020-08-12 DIAGNOSIS — I890 Lymphedema, not elsewhere classified: Secondary | ICD-10-CM | POA: Diagnosis not present

## 2020-08-12 NOTE — Telephone Encounter (Signed)
Pt did not show for appt.  Pt's last appt was 4/11 and then NS for next appt and Cx the following one.  Today she NS.  Called and went straight to VM.  Left message regarding concern for LE's returning to original size and loss of progress thus far.  Requested she return call to clinic to express her desire to return or to be done at this point.  Explained she has no other appt scheduled this week and is to returne next week.    Lurena Nida, PTA/CLT [phone]
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
## 2020-08-14 ENCOUNTER — Encounter (HOSPITAL_COMMUNITY): Payer: Self-pay

## 2020-08-14 ENCOUNTER — Ambulatory Visit (HOSPITAL_COMMUNITY): Payer: Medicare HMO

## 2020-08-14 ENCOUNTER — Other Ambulatory Visit: Payer: Self-pay

## 2020-08-14 DIAGNOSIS — I890 Lymphedema, not elsewhere classified: Secondary | ICD-10-CM | POA: Diagnosis not present
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
## 2020-08-17 ENCOUNTER — Other Ambulatory Visit: Payer: Self-pay

## 2020-08-17 ENCOUNTER — Encounter: Payer: Medicare HMO | Attending: General Surgery | Admitting: Skilled Nursing Facility1

## 2020-08-17 NOTE — Progress Notes (Signed)
Supervised Weight Loss Visit Bariatric Nutrition Education*  Planned Surgery: RYGB   3 out of 6 SWL Appointments    NUTRITION ASSESSMENT  Anthropometrics  Start weight at [facility]: 374.3 lbs (date: 05/20/2020)  Weight: 382 pounds   Height: 69 in BMI: 56.44 kg/m2     Clinical  Medical hx: GDM, lymphedema  Medications: vitamin D Labs: vitamin D 12, A1C 5.8, cholesterol 244, triglycerides 350 Notable signs/symptoms: N/A Any previous deficiencies? Iron, vitamin D (12)  Lifestyle & Dietary Hx Pt arrives with her new husband and baby.   Pt reports that she continues to wrap legs for lymphadema and it is helping.  Pt reports drinking water or unsweet tea.   Pt reports missing her virtual counseling appt so she is in the process of rescheduling.   Pt reports doing big girl move twice a week.  Estimated daily fluid intake: 96 fl oz  24-Hr Dietary Recall: up at 6am First Meal 11am-12: scrambled eggs & bratwurst type sausage, waffles Snack: fruit snacks Second Meal 3pm: no lunch Snack: no snack Third Meal: [employer] broccoli cheddar soup Snack:  Small bowl of special k (husband portioned out for her) Beverages: water  Estimated Energy Needs Calories: 1600  NUTRITION DIAGNOSIS  Overweight/obesity ([place]ameron-3.3) related to past poor dietary habits and physical inactivity as evidenced by patient w/ planned RYGB surgery following dietary guidelines for continued weight loss.   NUTRITION INTERVENTION  Nutrition counseling (C-1) and education (E-2) to facilitate bariatric surgery goals.  Pre-Op Goals Progress & New Goals . Continue: Call [facility] today for counseling: keep working on this . Continue: eat breakfast, lunch, and dinner  . Continue: Switch to body armour lyte and Gatorade zero . continue: Limit to 3 fruit servings per day: 1 apple + 1 orange + 3/4 cup berries . continue: limit coffee  To 2 cups . continue: chew each bite until applesauce consistency . Continue:  Eat  breakfast with son . Continue:  Limit fruit and rice cake  . NEW:  Be more consistent with getting two workouts per week . NEW:  Use hand or measuring cup to measure portion sizes   Handouts Previously Provided Include    Learning Style & Readiness for Change Teaching method utilized: Visual & Auditory  Demonstrated degree of understanding via: Teach Back  Readiness Level: Action Barriers to learning/adherence to lifestyle change: none identified at this time  RD's Notes for next Visit  . Assess pts adherecne to chosen goals    MONITORING & EVALUATION Dietary intake, weekly physical activity, body weight, and pre-op goals in 1 month.   Next Steps  Patient is to return to [facility] in 1 month

## 2020-08-18 ENCOUNTER — Other Ambulatory Visit: Payer: Self-pay

## 2020-08-18 ENCOUNTER — Encounter (HOSPITAL_COMMUNITY): Payer: Self-pay

## 2020-08-18 ENCOUNTER — Ambulatory Visit (HOSPITAL_COMMUNITY): Payer: Medicare HMO

## 2020-08-18 DIAGNOSIS — I890 Lymphedema, not elsewhere classified: Secondary | ICD-10-CM | POA: Diagnosis not present
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
## 2020-08-20 ENCOUNTER — Encounter (HOSPITAL_COMMUNITY): Payer: Medicare HMO | Admitting: Physical Therapy

## 2020-08-20 ENCOUNTER — Telehealth (HOSPITAL_COMMUNITY): Payer: Self-pay | Admitting: Physical Therapy

## 2020-08-20 NOTE — Telephone Encounter (Signed)
Pt did not show for appt this morning.  Called and left message reminding of appt tomorrow morning and requested to call and cancel if she could not make this.  Lurena Nida, PTA/CLT [phone]

## 2020-08-21 ENCOUNTER — Other Ambulatory Visit: Payer: Self-pay

## 2020-08-21 ENCOUNTER — Encounter (HOSPITAL_COMMUNITY): Payer: Self-pay | Admitting: Physical Therapy

## 2020-08-21 ENCOUNTER — Ambulatory Visit (HOSPITAL_COMMUNITY): Payer: Medicare HMO | Admitting: Physical Therapy

## 2020-08-21 DIAGNOSIS — I890 Lymphedema, not elsewhere classified: Secondary | ICD-10-CM | POA: Diagnosis not present
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
## 2020-08-25 ENCOUNTER — Ambulatory Visit (HOSPITAL_COMMUNITY): Payer: Medicare HMO | Attending: Nurse Practitioner | Admitting: Physical Therapy

## 2020-08-25 DIAGNOSIS — I890 Lymphedema, not elsewhere classified: Secondary | ICD-10-CM | POA: Insufficient documentation

## 2020-08-27 ENCOUNTER — Ambulatory Visit (HOSPITAL_COMMUNITY): Payer: Medicare HMO | Admitting: Physical Therapy

## 2020-08-27 ENCOUNTER — Telehealth (HOSPITAL_COMMUNITY): Payer: Self-pay | Admitting: Physical Therapy

## 2020-08-27 NOTE — Telephone Encounter (Signed)
Second no show.  No answer left a message that pt has an appointment tomorrow at 8:30.  All other appointments will be cancelled per policy.    Virgina Organ, PT CLT [phone]

## 2020-08-28 ENCOUNTER — Telehealth (HOSPITAL_COMMUNITY): Payer: Self-pay

## 2020-08-28 ENCOUNTER — Other Ambulatory Visit: Payer: Self-pay

## 2020-08-28 ENCOUNTER — Ambulatory Visit (HOSPITAL_COMMUNITY): Payer: Medicare HMO

## 2020-08-28 DIAGNOSIS — I890 Lymphedema, not elsewhere classified: Secondary | ICD-10-CM
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
## 2020-08-28 NOTE — Telephone Encounter (Signed)
No show, called and spoke to pt who stated she was struggling to make it to early apt today due to her son.  Opening later today at 245.    Becky Sax, LPTA/CLT; Rowe Clack [phone]

## 2020-08-31 NOTE — Addendum Note (Signed)
Addended by: Bella Kennedy on: 08/31/2020 02:12 PM   Modules accepted: Orders

## 2020-09-01 ENCOUNTER — Ambulatory Visit (HOSPITAL_COMMUNITY): Payer: Medicare HMO | Admitting: Physical Therapy

## 2020-09-02 ENCOUNTER — Ambulatory Visit (HOSPITAL_COMMUNITY): Payer: Medicare HMO | Admitting: Physical Therapy

## 2020-09-02 ENCOUNTER — Other Ambulatory Visit: Payer: Self-pay

## 2020-09-02 ENCOUNTER — Ambulatory Visit (HOSPITAL_COMMUNITY): Payer: Medicare HMO

## 2020-09-02 DIAGNOSIS — I890 Lymphedema, not elsewhere classified: Secondary | ICD-10-CM | POA: Diagnosis not present
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
## 2020-09-03 ENCOUNTER — Encounter: Payer: Medicare HMO | Admitting: Nurse Practitioner

## 2020-09-03 ENCOUNTER — Ambulatory Visit (HOSPITAL_COMMUNITY): Payer: Medicare HMO | Admitting: Physical Therapy

## 2020-09-07 ENCOUNTER — Ambulatory Visit (HOSPITAL_COMMUNITY): Payer: Medicare HMO | Admitting: Physical Therapy

## 2020-09-09 ENCOUNTER — Ambulatory Visit (HOSPITAL_COMMUNITY): Payer: Medicare HMO | Admitting: Physical Therapy

## 2020-09-09 ENCOUNTER — Telehealth (HOSPITAL_COMMUNITY): Payer: Self-pay | Admitting: Physical Therapy

## 2020-09-09 NOTE — Telephone Encounter (Signed)
Pt did not show for scheduled appointment today.  Called and left VM regarding missed appointment and overall non compliance.  Appears pt only came to one appointment last week and has cancelled all other appointments.   Pt has no other appointments scheduled at this time and will be unable to make any due to no availability at clinic.  Informed patient she would be discharged at this time and she would need to f/u with [facility] to get her garments ASAP to keep her lymphedema contained.   Lurena Nida, PTA/CLT [phone]

## 2020-09-10 ENCOUNTER — Ambulatory Visit (HOSPITAL_COMMUNITY): Payer: Medicare HMO | Admitting: Physical Therapy

## 2020-09-14 ENCOUNTER — Ambulatory Visit: Payer: Medicare HMO | Admitting: Skilled Nursing Facility1

## 2020-09-15 ENCOUNTER — Encounter (HOSPITAL_COMMUNITY): Payer: Medicare HMO | Admitting: Physical Therapy

## 2020-09-16 ENCOUNTER — Ambulatory Visit (INDEPENDENT_AMBULATORY_CARE_PROVIDER_SITE_OTHER): Payer: Medicare HMO | Admitting: Nurse Practitioner

## 2020-09-16 ENCOUNTER — Encounter (HOSPITAL_COMMUNITY): Payer: Medicare HMO

## 2020-09-18 ENCOUNTER — Encounter (HOSPITAL_COMMUNITY): Payer: Medicare HMO

## 2020-09-23 ENCOUNTER — Other Ambulatory Visit: Payer: Self-pay

## 2020-09-23 ENCOUNTER — Ambulatory Visit (INDEPENDENT_AMBULATORY_CARE_PROVIDER_SITE_OTHER): Payer: Medicare HMO | Admitting: Nurse Practitioner

## 2020-09-23 VITALS — BP 142/70 | HR 109 | Temp 97.2°F | Ht 69.0 in | Wt 387.0 lb

## 2020-09-23 DIAGNOSIS — R7309 Other abnormal glucose: Secondary | ICD-10-CM | POA: Diagnosis not present

## 2020-09-23 DIAGNOSIS — D649 Anemia, unspecified: Secondary | ICD-10-CM

## 2020-09-23 DIAGNOSIS — R7303 Prediabetes: Secondary | ICD-10-CM | POA: Diagnosis not present

## 2020-09-23 DIAGNOSIS — E559 Vitamin D deficiency, unspecified: Secondary | ICD-10-CM

## 2020-09-23 DIAGNOSIS — R030 Elevated blood-pressure reading, without diagnosis of hypertension: Secondary | ICD-10-CM
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
## 2020-09-24 ENCOUNTER — Encounter: Payer: Medicare HMO | Attending: General Surgery | Admitting: Skilled Nursing Facility1

## 2020-09-24 ENCOUNTER — Telehealth (INDEPENDENT_AMBULATORY_CARE_PROVIDER_SITE_OTHER): Payer: Self-pay | Admitting: Nurse Practitioner

## 2020-09-24 DIAGNOSIS — E669 Obesity, unspecified: Secondary | ICD-10-CM | POA: Diagnosis not present

## 2020-09-24 DIAGNOSIS — Z6841 Body Mass Index (BMI) 40.0 and over, adult: Secondary | ICD-10-CM | POA: Insufficient documentation

## 2020-09-24 LAB — COMPLETE METABOLIC PANEL WITH GFR
AG Ratio: 1.4 (calc) (ref 1.0–2.5)
ALT: 30 U/L — ABNORMAL HIGH (ref 6–29)
AST: 29 U/L (ref 10–30)
Albumin: 4 g/dL (ref 3.6–5.1)
Alkaline phosphatase (APISO): 90 U/L (ref 31–125)
BUN/Creatinine Ratio: 9 (calc) (ref 6–22)
BUN: 11 mg/dL (ref 7–25)
CO2: 28 mmol/L (ref 20–32)
Calcium: 9.1 mg/dL (ref 8.6–10.2)
Chloride: 103 mmol/L (ref 98–110)
Creat: 1.25 mg/dL — ABNORMAL HIGH (ref 0.50–1.10)
GFR, Est African American: 64 mL/min/{1.73_m2} (ref >=60)
GFR, Est Non African American: 55 mL/min/{1.73_m2} — ABNORMAL LOW (ref >=60)
Globulin: 2.9 g/dL (calc) (ref 1.9–3.7)
Glucose, Bld: 174 mg/dL — ABNORMAL HIGH (ref 65–139)
Potassium: 4.5 mmol/L (ref 3.5–5.3)
Sodium: 140 mmol/L (ref 135–146)
Total Bilirubin: 0.3 mg/dL (ref 0.2–1.2)
Total Protein: 6.9 g/dL (ref 6.1–8.1)

## 2020-09-24 LAB — HEMOGLOBIN A1C
Hgb A1c MFr Bld: 6.9 % of total Hgb — ABNORMAL HIGH (ref <5.7)
Mean Plasma Glucose: 151 mg/dL
eAG (mmol/L): 8.4 mmol/L

## 2020-09-24 LAB — CBC WITH DIFFERENTIAL/PLATELET
Absolute Monocytes: 422 cells/uL (ref 200–950)
Basophils Absolute: 26 cells/uL (ref 0–200)
Basophils Relative: 0.3 %
Eosinophils Absolute: 62 cells/uL (ref 15–500)
Eosinophils Relative: 0.7 %
HCT: 40.3 % (ref 35.0–45.0)
Hemoglobin: 12.6 g/dL (ref 11.7–15.5)
Lymphs Abs: 3203 cells/uL (ref 850–3900)
MCH: 25.6 pg — ABNORMAL LOW (ref 27.0–33.0)
MCHC: 31.3 g/dL — ABNORMAL LOW (ref 32.0–36.0)
MCV: 81.9 fL (ref 80.0–100.0)
MPV: 11 fL (ref 7.5–12.5)
Monocytes Relative: 4.8 %
Neutro Abs: 5086 cells/uL (ref 1500–7800)
Neutrophils Relative %: 57.8 %
Platelets: 302 10*3/uL (ref 140–400)
RBC: 4.92 10*6/uL (ref 3.80–5.10)
RDW: 13.6 % (ref 11.0–15.0)
Total Lymphocyte: 36.4 %
WBC: 8.8 10*3/uL (ref 3.8–10.8)

## 2020-09-24 LAB — VITAMIN D 25 HYDROXY (VIT D DEFICIENCY, FRACTURES): Vit D, 25-Hydroxy: 33 ng/mL (ref 30–100)

## 2020-09-24 NOTE — Progress Notes (Signed)
Supervised Weight Loss Visit Bariatric Nutrition Education  Planned Surgery: RYGB   4 out of 6 SWL Appointments    NUTRITION ASSESSMENT  Anthropometrics  Start weight at [facility]: 374.3 lbs (date: 05/20/2020)  Weight: 384.3 pounds   Height: 69 in BMI: 56.75 kg/m2     Clinical  Medical hx: GDM, lymphedema  Medications: vitamin D Labs: vitamin D 12, A1C 5.8, cholesterol 244, triglycerides 350 Notable signs/symptoms: N/A Any previous deficiencies? Iron, vitamin D (12)  Lifestyle & Dietary Hx Pt arrives with her baby.   Pt states she was unable to workout 2 days a week but has hit one day a week. Pt states using measuring cups to measure out her food has gone well.  Pt states he has had a lot of stuff going on her life causing stress (did not wish to go into more detail). Pt states she feeds her son whatever is on [facility] such as popcorn and cheese doodles.  Pt states she has no clue what caused her weight gain since last appt.   Estimated daily fluid intake: 96 fl oz  24-Hr Dietary Recall: up at 6am First Meal 9am-10am: applesauce + sausage  Snack: fruit snacks Second Meal 3pm: tomato sandwich  Snack: no snack Third Meal: pot roast  Snack:   Beverages: water, unsweet tea   Estimated Energy Needs Calories: 1600  NUTRITION DIAGNOSIS  Overweight/obesity ([place]-3.3) related to past poor dietary habits and physical inactivity as evidenced by patient w/ planned RYGB surgery following dietary guidelines for continued weight loss.   NUTRITION INTERVENTION  Nutrition counseling (C-1) and education (E-2) to facilitate bariatric surgery goals.  Pre-Op Goals Progress & New Goals . Continue: Call [facility] today for counseling: keep working on this . Continue: eat breakfast, lunch, and dinner  . Continue: Switch to body armour lyte and Gatorade zero . continue: Limit to 3 fruit servings per day: 1 apple + 1 orange + 3/4 cup berries . continue: limit coffee  To 2 cups . continue: chew  each bite until applesauce consistency . Continue:  Eat breakfast with son . Continue:  Limit fruit and rice cake  . continue:  Be more consistent with getting two workouts per week . continue:  Use hand or measuring cup to measure portion sizes   Handouts Previously Provided Include    Learning Style & Readiness for Change Teaching method utilized: Visual & Auditory  Demonstrated degree of understanding via: Teach Back  Readiness Level: Action Barriers to learning/adherence to lifestyle change: none identified at this time  RD's Notes for next Visit  . Assess pts adherecne to chosen goals    MONITORING & EVALUATION Dietary intake, weekly physical activity, body weight, and pre-op goals in 1 month.   Next Steps  Patient is to return to [facility] in 1 month

## 2020-09-24 NOTE — Telephone Encounter (Signed)
Please call this patient and get her scheduled with myself either via telephone or in person within the next 4 to 6 weeks.  Need to discuss her lab work and medication adjustments.  Thank you.

## 2020-09-29 NOTE — Telephone Encounter (Signed)
Done... Scheduled her a phone visit with you tomorrow 09/30/20 at 2:00

## 2020-09-30 ENCOUNTER — Encounter (INDEPENDENT_AMBULATORY_CARE_PROVIDER_SITE_OTHER): Payer: Self-pay | Admitting: Nurse Practitioner

## 2020-09-30 ENCOUNTER — Telehealth (INDEPENDENT_AMBULATORY_CARE_PROVIDER_SITE_OTHER): Payer: Medicare HMO | Admitting: Nurse Practitioner

## 2020-09-30 VITALS — Ht 69.0 in | Wt 387.0 lb

## 2020-09-30 DIAGNOSIS — E1165 Type 2 diabetes mellitus with hyperglycemia: Secondary | ICD-10-CM

## 2020-09-30 MED ORDER — METFORMIN HCL 500 MG PO TABS
500.0000 mg | ORAL_TABLET | Freq: Every day | ORAL | 0 refills | Status: DC
Start: 2020-09-30 — End: 2021-04-06

## 2020-09-30 MED ORDER — BLOOD GLUCOSE MONITOR KIT: PACK | 0 refills | Status: DC
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
## 2020-10-06 ENCOUNTER — Encounter (HOSPITAL_COMMUNITY): Payer: Self-pay | Admitting: Physical Therapy

## 2020-10-06 NOTE — Therapy (Signed)
[facility] [place], [place], [address] Phone: [phone]   Fax:  [phone]  Patient Details  Name: [name] MRN: [id number] Date of Birth: Feb 03, 1986 Referring Provider:  No ref. provider found  Encounter Date: 10/06/2020  PHYSICAL THERAPY DISCHARGE SUMMARY  Visits from Start of Care: 21  Current functional level related to goals / functional outcomes: PT edema has decreased, volume was lower however for the past 4 weeks pt has not been consistent in coming to therapy.    Remaining deficits: edema   Education / Equipment:  LE exercises, the importance of pumping and wearing compression garments on a daily basis.   Patient agrees to discharge. Patient goals were partially met. Patient is being discharged due to not returning since the last visit.  Rayetta Humphrey, PT CLT [phone]  10/06/2020, 9:55 AM  [place], [place], [address] Phone: [phone]   Fax:  [phone]

## 2020-10-20 ENCOUNTER — Telehealth (INDEPENDENT_AMBULATORY_CARE_PROVIDER_SITE_OTHER): Payer: Self-pay

## 2020-10-20 NOTE — Telephone Encounter (Signed)
Pt called asking to send her something for her leg again. She said it is doing like it did the last time to she saw you on appt.

## 2020-10-21 NOTE — Telephone Encounter (Signed)
Pt was called and given verbal instructions for to Leg pain/ swelling. Instructions to take OTC tylenol, Advil as direct to help relieve pain, inflammation. But if it get worse , unbearable, or have side effects of shortness of breath, numbness, tingling to go to Hospital ER ASAP. Pt is aware. Pt is agreed to instructions given.

## 2020-10-21 NOTE — Telephone Encounter (Signed)
The earliest I could get her in was Next Thur the 7th

## 2020-10-29 ENCOUNTER — Encounter (INDEPENDENT_AMBULATORY_CARE_PROVIDER_SITE_OTHER): Payer: Self-pay | Admitting: Nurse Practitioner

## 2020-10-29 ENCOUNTER — Ambulatory Visit (INDEPENDENT_AMBULATORY_CARE_PROVIDER_SITE_OTHER): Payer: Medicare HMO | Admitting: Nurse Practitioner

## 2020-10-29 ENCOUNTER — Encounter: Payer: Medicare HMO | Attending: General Surgery | Admitting: Skilled Nursing Facility1

## 2020-10-29 ENCOUNTER — Ambulatory Visit (HOSPITAL_COMMUNITY)
Admission: RE | Admit: 2020-10-29 | Discharge: 2020-10-29 | Disposition: A | Discharge status: Home / Self Care | Payer: Medicare HMO | Source: Ambulatory Visit | Attending: Nurse Practitioner | Admitting: Nurse Practitioner

## 2020-10-29 ENCOUNTER — Telehealth (INDEPENDENT_AMBULATORY_CARE_PROVIDER_SITE_OTHER): Payer: Self-pay | Admitting: Nurse Practitioner

## 2020-10-29 ENCOUNTER — Other Ambulatory Visit: Payer: Self-pay

## 2020-10-29 VITALS — BP 126/89 | HR 128 | Temp 97.8°F | Resp 20 | Ht 69.0 in | Wt 391.0 lb

## 2020-10-29 DIAGNOSIS — Z6841 Body Mass Index (BMI) 40.0 and over, adult: Secondary | ICD-10-CM | POA: Diagnosis not present

## 2020-10-29 DIAGNOSIS — E1165 Type 2 diabetes mellitus with hyperglycemia: Secondary | ICD-10-CM | POA: Diagnosis not present

## 2020-10-29 DIAGNOSIS — R600 Localized edema: Secondary | ICD-10-CM | POA: Diagnosis not present

## 2020-10-29 DIAGNOSIS — M79605 Pain in left leg: Secondary | ICD-10-CM

## 2020-10-29 DIAGNOSIS — M5442 Lumbago with sciatica, left side: Secondary | ICD-10-CM

## 2020-10-29 DIAGNOSIS — M7989 Other specified soft tissue disorders: Secondary | ICD-10-CM | POA: Diagnosis not present

## 2020-10-29 DIAGNOSIS — Z01818 Encounter for other preprocedural examination: Secondary | ICD-10-CM | POA: Diagnosis not present

## 2020-10-29 MED ORDER — TRAMADOL HCL 50 MG PO TABS: 50.0000 mg | ORAL_TABLET | Freq: Two times a day (BID) | ORAL | 0 refills | Status: AC | PRN
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
## 2020-10-29 NOTE — Telephone Encounter (Signed)
Ordered PT for low back pain and sciatica. Patient would like to go to PT in [place] if possible.

## 2020-10-29 NOTE — Progress Notes (Signed)
Supervised Weight Loss Visit Bariatric Nutrition Education  Planned Surgery: RYGB   5 out of 6 SWL Appointments    NUTRITION ASSESSMENT  Anthropometrics  Start weight at [facility]: 374.3 lbs (date: 05/20/2020)  Weight: 392.9 pounds   Height: 69 in BMI: 58.02 kg/m2     Clinical  Medical hx: GDM, lymphedema  Medications: vitamin D Labs: vitamin D 12, A1C 5.8, cholesterol 244, triglycerides 350 Notable signs/symptoms: N/A Any previous deficiencies? Iron, vitamin D (12)  Lifestyle & Dietary Hx Pt arrives with her baby.   Pt arrives having gained 8 pounds. Pt states she does not know where the weight came from. Pt states she is struggling with appropriate portion sizes. Pt states she has not drank any sodas.   Estimated daily fluid intake: 96 fl oz  24-Hr Dietary Recall: up at 6am First Meal 9am-10am: applesauce + sausage  Snack: fruit snacks Second Meal 3pm: tomato sandwich  Snack: no snack Third Meal: pot roast  Snack:   Beverages: water, unsweet tea   Estimated Energy Needs Calories: 1600  NUTRITION DIAGNOSIS  Overweight/obesity ([place]-3.3) related to past poor dietary habits and physical inactivity as evidenced by patient w/ planned RYGB surgery following dietary guidelines for continued weight loss.   NUTRITION INTERVENTION  Nutrition counseling (C-1) and education (E-2) to facilitate bariatric surgery goals.  Pre-Op Goals Progress & New Goals Continue: Call [facility] today for counseling: keep working on this Continue: eat breakfast, lunch, and dinner  Continue: Switch to body armour lyte and Gatorade zero continue: Limit to 3 fruit servings per day: 1 apple + 1 orange + 3/4 cup berries continue: limit coffee  To 2 cups continue: chew each bite until applesauce consistency Continue:  Eat breakfast with son Continue:  Limit fruit and rice cake  continue:  Be more consistent with getting two workouts per week NEW/continue:  Use hand or measuring cup to measure  portion sizes; buy some measuring cups: buy some measuring cups Use your palm not your whole hand to measure meats  Use 1 cup measuring cup to measure non starchy vegetables (broccoli, squash, cauliflower, carrots, etc) do 2 of these Use the  cup measuring cup for all carbohydrates fruit such as watermelon or cantaloupe or beans or corn or peas or potatoes or rice or mac n cheese Use 2 Tablespoons (Tbs) for peanut butter   Handouts Previously Provided Include    Learning Style & Readiness for Change Teaching method utilized: Visual & Auditory  Demonstrated degree of understanding via: Teach Back  Readiness Level: Action Barriers to learning/adherence to lifestyle change: none identified at this time  RD's Notes for next Visit  Assess pts adherecne to chosen goals    MONITORING & EVALUATION Dietary intake, weekly physical activity, body weight, and pre-op goals in 1 month.   Next Steps  Patient is to return to [facility] in 1 month

## 2020-10-29 NOTE — Telephone Encounter (Signed)
You read my mind . I actually did send to [facility]. They will contact her

## 2020-11-04 ENCOUNTER — Ambulatory Visit (INDEPENDENT_AMBULATORY_CARE_PROVIDER_SITE_OTHER): Payer: Medicare HMO | Admitting: Nurse Practitioner

## 2020-11-11 ENCOUNTER — Ambulatory Visit (INDEPENDENT_AMBULATORY_CARE_PROVIDER_SITE_OTHER): Payer: Medicare HMO | Admitting: Psychology

## 2020-11-11 DIAGNOSIS — F509 Eating disorder, unspecified: Secondary | ICD-10-CM | POA: Diagnosis not present

## 2020-11-25 ENCOUNTER — Ambulatory Visit (INDEPENDENT_AMBULATORY_CARE_PROVIDER_SITE_OTHER): Payer: Medicare HMO | Admitting: Psychology

## 2020-11-25 DIAGNOSIS — F509 Eating disorder, unspecified: Secondary | ICD-10-CM

## 2020-12-02 ENCOUNTER — Encounter: Payer: Medicare HMO | Attending: General Surgery | Admitting: Skilled Nursing Facility1

## 2020-12-02 ENCOUNTER — Other Ambulatory Visit: Payer: Self-pay

## 2020-12-02 DIAGNOSIS — E1165 Type 2 diabetes mellitus with hyperglycemia: Secondary | ICD-10-CM | POA: Diagnosis not present

## 2020-12-02 NOTE — Progress Notes (Signed)
Supervised Weight Loss Visit Bariatric Nutrition Education  Planned Surgery: RYGB   6 out of 6 SWL Appointments   Pt completed visits.   Pt has cleared nutrition requirements.    NUTRITION ASSESSMENT  Anthropometrics  Start weight at [facility]: 374.3 lbs (date: 05/20/2020)  Weight: 393 pounds   Height: 69 in BMI: 58.04 kg/m2     Clinical  Medical hx: GDM, lymphedema, DM Medications: vitamin D Labs: vitamin D 33, A1C 6.9, cholesterol 244, triglycerides 350, creatinine 1.25  Notable signs/symptoms: N/A Any previous deficiencies? Iron, vitamin D  Lifestyle & Dietary Hx Pt arrives with her baby and husband.    Pt state she has been checking her blood sugars which have been 113, 128, 118, 83, 146, 156 stating she was diagnosed with DM in June.    Estimated daily fluid intake: 70 fl oz  24-Hr Dietary Recall: up at 6am First Meal 9am-10am: applesauce + sausage or skipped Snack: fruit snacks Second Meal 3pm: tomato sandwich or peanut butter and jelly sandwich Snack: no snack Third Meal: pot roast + peas + corn Snack:   Beverages: water, unsweet tea, gatorade zero  Estimated Energy Needs Calories: 1600  NUTRITION DIAGNOSIS  Overweight/obesity ([place]-3.3) related to past poor dietary habits and physical inactivity as evidenced by patient w/ planned RYGB surgery following dietary guidelines for continued weight loss.   NUTRITION INTERVENTION  Nutrition counseling (C-1) and education (E-2) to facilitate bariatric surgery goals. Educated pt on hypoglycemia awareness and correction  Pre-Op Goals Progress & New Goals Continue: Call [facility] today for counseling: keep working on this Continue: eat breakfast, lunch, and dinner  Continue: Switch to body armour lyte and Gatorade zero continue: Limit to 3 fruit servings per day: 1 apple + 1 orange + 3/4 cup berries continue: limit coffee  To 2 cups continue: chew each bite until applesauce consistency Continue:  Eat breakfast  with son Continue:  Limit fruit and rice cake  continue:  Be more consistent with getting two workouts per week continue:  Use hand or measuring cup to measure portion sizes; buy some measuring cups: buy some measuring cups Use your palm not your whole hand to measure meats  Use 1 cup measuring cup to measure non starchy vegetables (broccoli, squash, cauliflower, carrots, etc) do 2 of these Use the  cup measuring cup for all carbohydrates fruit such as watermelon or cantaloupe or beans or corn or peas or potatoes or rice or mac n cheese Use 2 Tablespoons (Tbs) for peanut butter Meatless protein options: Morning Star, beyond meat, dried beans, lentils, Quinoa, Impossible Burger, nuts, seeds Do not skip meals If you feel shaky or your stomach hurts check your blood sugar: if it is below 70 you need to fix it. You fix it by drinking 4 ounces of REGULAR juice and chill for 15 minutes then recheck blood sugar if above 70 have a meal with protein in it. If it is still low repeat drinking the juice     -Always bring your meter with you everywhere you go -Always Properly dispose of your needles:  -Discard in a hard plastic/metal container with a lid (something the needle can't puncture)  -Write Do Not Recycle on the outside of the container  -Example: A laundry detergent bottle -Never use the same needle more than once -Eat 2-3 carbohydrate choices for each meal and 1 for each snack -A meal: carbohydrates, protein, vegetable -A snack: A Fruit OR Vegetable AND Protein  -Try to be more active -Always pay  attention to your body keeping watchful of possible low blood sugar (below 70) or high blood sugar (above 200) -Check your feet every day looking for anything that was not there the day before   Handouts Previously Provided Include  Detailed MyPlate   Learning Style & Readiness for Change Teaching method utilized: Visual & Auditory  Demonstrated degree of understanding via: Teach Back   Readiness Level: Action Barriers to learning/adherence to lifestyle change: some comprehension barriers   RD's Notes for next Visit  Assess pts adherecne to chosen goals    MONITORING & EVALUATION Dietary intake, weekly physical activity, body weight, and pre-op goals   Next Steps  Patient is to return to [facility]  Pt has completed visits. No further supervised visits required/recomended

## 2020-12-23 ENCOUNTER — Ambulatory Visit (INDEPENDENT_AMBULATORY_CARE_PROVIDER_SITE_OTHER): Payer: Medicare HMO | Admitting: Internal Medicine

## 2021-01-18 ENCOUNTER — Encounter: Payer: Medicare HMO | Attending: General Surgery | Admitting: Skilled Nursing Facility1

## 2021-01-18 ENCOUNTER — Other Ambulatory Visit: Payer: Self-pay

## 2021-01-18 DIAGNOSIS — E1165 Type 2 diabetes mellitus with hyperglycemia: Secondary | ICD-10-CM | POA: Insufficient documentation

## 2021-01-19 NOTE — Progress Notes (Signed)
Pre-Operative Nutrition Class:    Patient was seen on 01/18/2021 for Pre-Operative Bariatric Surgery Education at [facility]    Surgery date: 02/15/2021 Surgery type: RYGB Start weight at [facility]: 374.3 pounds Weight today: 391.2 pounds  The following the learning objectives were met by the patient during this course: Identify Pre-Op Dietary Goals and will begin 2 weeks pre-operatively Identify appropriate sources of fluids and proteins  State protein recommendations and appropriate sources pre and post-operatively Identify Post-Operative Dietary Goals and will follow for 2 weeks post-operatively Identify appropriate multivitamin and calcium sources Describe the need for physical activity post-operatively and will follow MD recommendations State when to call healthcare provider regarding medication questions or post-operative complications When having a diagnosis of diabetes understanding hypoglycemia symptoms and the inclusion of 1 complex carbohydrate per meal  Handouts given during class include: Pre-Op Bariatric Surgery Diet Handout Protein Shake Handout Post-Op Bariatric Surgery Nutrition Handout BELT Program Information Flyer Support Group Information Flyer [facility] Bariatric Supplements Price List  Follow-Up Plan: Patient will follow-up at [facility] 2 weeks post operatively for diet advancement per MD.

## 2021-01-21 ENCOUNTER — Ambulatory Visit: Payer: Self-pay | Admitting: General Surgery

## 2021-02-01 NOTE — Patient Instructions (Addendum)
DUE TO COVID-19 ONLY ONE VISITOR IS ALLOWED TO COME WITH YOU AND STAY IN THE WAITING ROOM ONLY DURING PRE OP AND PROCEDURE.   **NO VISITORS ARE ALLOWED IN THE SHORT STAY AREA OR RECOVERY ROOM!!**  IF YOU WILL BE ADMITTED INTO THE HOSPITAL YOU ARE ALLOWED ONLY TWO SUPPORT PEOPLE DURING VISITATION HOURS ONLY (7 AM -8PM)    Up to two visitors ages 65+ are allowed at one time in a patient's room.  The visitors may rotate out with other people throughout the day.  Additionally, up to two children between the ages of 47 and 13 are allowed and do not count toward the number of allowed visitors.  Children within this age range must be accompanied by an adult visitor.  One adult visitor may remain with the patient overnight and must be in the room by 8 PM.  COVID SWAB TESTING MUST BE COMPLETED ON:  02-11-21, Between the hours of 8 and 3  **MUST PRESENT COMPLETED FORM AT TESTING SITE**    [address] [place] [facility] (backside of the building)  You are not required to quarantine, however you are required to wear a well-fitted mask when you are out and around people not in your household.  Hand Hygiene often Do NOT share personal items Notify your provider if you are in close contact with someone who has COVID or you develop fever 100.4 or greater, new onset of sneezing, cough, sore throat, shortness of breath or body aches.        Your procedure is scheduled on:  Monday, 02-15-21   Report to [facility] Main  Entrance   Report to Short Stay at 5:15 AM   [facility])   Call this number if you have problems the morning of surgery [phone]   Do not eat food :After 6:00 PM.   May have liquids until 4:30 AM day of surgery  CLEAR LIQUID DIET  Foods Allowed                                                                     Foods Excluded  Water, Black Coffee (no milk/no creamer) and tea, regular and decaf                              liquids that you cannot  Plain Jell-O in  any flavor  (No red)                         see through such as: Fruit ices (not with fruit pulp)                                 milk, soups, orange juice  Iced Popsicles (No red)                                    All solid food  Apple juices Sports drinks like Gatorade (No red) Lightly seasoned clear broth or consume(fat free) Sugar Sample Menu Breakfast                                Lunch                                     Supper Cranberry juice                    Beef broth                            Chicken broth Jell-O                                     Grape juice                           Apple juice Coffee or tea                        Jell-O                                      Popsicle                                                Coffee or tea                        Coffee or tea      Complete one G2 drink the morning of surgery at 4:30 AM the day of surgery.       The day of surgery:  Drink ONE (1) Pre-Surgery G2  the morning of surgery. Drink in one sitting. Do not sip.  This drink was given to you during your hospital  pre-op appointment visit. Nothing else to drink after completing the G2.          If you have questions, please contact your surgeon's office.     Oral Hygiene is also important to reduce your risk of infection.                                    Remember - BRUSH YOUR TEETH THE MORNING OF SURGERY WITH YOUR REGULAR TOOTHPASTE   Do NOT smoke after Midnight   Take these medicines the morning of surgery with A SIP OF WATER:  None  How to Manage Your Diabetes Before and After Surgery  Why is it important to control my blood sugar before and after surgery? Improving blood sugar levels before and after surgery helps healing and can limit problems. A way of improving blood sugar control is eating a healthy diet by:  Eating less sugar and carbohydrates  Increasing activity/exercise  Talking with your doctor about  reaching your blood sugar goals High blood sugars (greater than 180  mg/dL) can raise your risk of infections and slow your recovery, so you will need to focus on controlling your diabetes during the weeks before surgery. Make sure that the doctor who takes care of your diabetes knows about your planned surgery including the date and location.  How do I manage my blood sugar before surgery? Check your blood sugar at least 4 times a day, starting 2 days before surgery, to make sure that the level is not too high or low. Check your blood sugar the morning of your surgery when you wake up and every 2 hours until you get to the Short Stay unit. If your blood sugar is less than 70 mg/dL, you will need to treat for low blood sugar: Do not take insulin. Treat a low blood sugar (less than 70 mg/dL) with  cup of clear juice (cranberry or apple), 4 glucose tablets, OR glucose gel. Recheck blood sugar in 15 minutes after treatment (to make sure it is greater than 70 mg/dL). If your blood sugar is not greater than 70 mg/dL on recheck, call [phone] for further instructions. Report your blood sugar to the short stay nurse when you get to Short Stay.  If you are admitted to the hospital after surgery: Your blood sugar will be checked by the staff and you will probably be given insulin after surgery (instead of oral diabetes medicines) to make sure you have good blood sugar levels. The goal for blood sugar control after surgery is 80-180 mg/dL.   WHAT DO I DO ABOUT MY DIABETES MEDICATION?  Do not take oral diabetes medicines (pills) the morning of surgery.  THE DAY BEFORE SURGERY:  Take Metformin as prescribed.       THE MORNING OF SURGERY:  Do not take Metformin.  Reviewed and Endorsed by [facility] Patient Education Committee, August 2015                  You may not have any metal on your body including hair pins, jewelry, and body piercing             Do not wear make-up, lotions, powders,  perfumes or deodorant  Do not wear nail polish including gel and S&S, artificial/acrylic nails, or any other type of covering on natural nails including finger and toenails. If you have artificial nails, gel coating, etc. that needs to be removed by a nail salon please have this removed prior to surgery or surgery may need to be canceled/ delayed if the surgeon/ anesthesia feels like they are unable to be safely monitored.   Do not shave  48 hours prior to surgery.        Do not bring valuables to the hospital. [facility] IS NOT RESPONSIBLE FOR VALUABLES.   Contacts, dentures or bridgework may not be worn into surgery.   Bring small overnight bag day of surgery.   Please read over the following fact sheets you were given: IF YOU HAVE QUESTIONS ABOUT YOUR PRE OP INSTRUCTIONS PLEASE CALL [phone]   [facility] - Preparing for Surgery Before surgery, you can play an important role.  Because skin is not sterile, your skin needs to be as free of germs as possible.  You can reduce the number of germs on your skin by washing with CHG (chlorahexidine gluconate) soap before surgery.  CHG is an antiseptic cleaner which kills germs and bonds with the skin to continue killing germs even after washing. Please DO NOT use if you have an  allergy to CHG or antibacterial soaps.  If your skin becomes reddened/irritated stop using the CHG and inform your nurse when you arrive at Short Stay. Do not shave (including legs and underarms) for at least 48 hours prior to the first CHG shower.  You may shave your face/neck.  Please follow these instructions carefully:  1.  Shower with CHG Soap the night before surgery and the  morning of surgery.  2.  If you choose to wash your hair, wash your hair first as usual with your normal  shampoo.  3.  After you shampoo, rinse your hair and body thoroughly to remove the shampoo.                             4.  Use CHG as you would any other liquid soap.  You can apply chg  directly to the skin and wash.  Gently with a scrungie or clean washcloth.  5.  Apply the CHG Soap to your body ONLY FROM THE NECK DOWN.   Do   not use on face/ open                           Wound or open sores. Avoid contact with eyes, ears mouth and   genitals (private parts).                       Wash face,  Genitals (private parts) with your normal soap.             6.  Wash thoroughly, paying special attention to the area where your    surgery  will be performed.  7.  Thoroughly rinse your body with warm water from the neck down.  8.  DO NOT shower/wash with your normal soap after using and rinsing off the CHG Soap.                9.  Pat yourself dry with a clean towel.            10.  Wear clean pajamas.            11.  Place clean sheets on your bed the night of your first shower and do not  sleep with pets. Day of Surgery : Do not apply any lotions/deodorants the morning of surgery.  Please wear clean clothes to the hospital/surgery center.  FAILURE TO FOLLOW THESE INSTRUCTIONS MAY RESULT IN THE CANCELLATION OF YOUR SURGERY  PATIENT SIGNATURE_________________________________  NURSE SIGNATURE__________________________________  ________________________________________________________________________   [name]  An incentive spirometer is a tool that can help keep your lungs clear and active. This tool measures how well you are filling your lungs with each breath. Taking long deep breaths may help reverse or decrease the chance of developing breathing (pulmonary) problems (especially infection) following: A long period of time when you are unable to move or be active. BEFORE THE PROCEDURE  If the spirometer includes an indicator to show your best effort, your nurse or respiratory therapist will set it to a desired goal. If possible, sit up straight or lean slightly forward. Try not to slouch. Hold the incentive spirometer in an upright position. INSTRUCTIONS FOR USE   Sit on the edge of your bed if possible, or sit up as far as you can in bed or on a chair. Hold the incentive spirometer in an upright position. Breathe out  normally. Place the mouthpiece in your mouth and seal your lips tightly around it. Breathe in slowly and as deeply as possible, raising the piston or the ball toward the top of the column. Hold your breath for 3-5 seconds or for as long as possible. Allow the piston or ball to fall to the bottom of the column. Remove the mouthpiece from your mouth and breathe out normally. Rest for a few seconds and repeat Steps 1 through 7 at least 10 times every 1-2 hours when you are awake. Take your time and take a few normal breaths between deep breaths. The spirometer may include an indicator to show your best effort. Use the indicator as a goal to work toward during each repetition. After each set of 10 deep breaths, practice coughing to be sure your lungs are clear. If you have an incision (the cut made at the time of surgery), support your incision when coughing by placing a pillow or rolled up towels firmly against it. Once you are able to get out of bed, walk around indoors and cough well. You may stop using the incentive spirometer when instructed by your caregiver.  RISKS AND COMPLICATIONS Take your time so you do not get dizzy or light-headed. If you are in pain, you may need to take or ask for pain medication before doing incentive spirometry. It is harder to take a deep breath if you are having pain. AFTER USE Rest and breathe slowly and easily. It can be helpful to keep track of a log of your progress. Your caregiver can provide you with a simple table to help with this. If you are using the spirometer at home, follow these instructions: SEEK MEDICAL CARE IF:  You are having difficultly using the spirometer. You have trouble using the spirometer as often as instructed. Your pain medication is not giving enough relief while using the  spirometer. You develop fever of 100.5 F (38.1 C) or higher. SEEK IMMEDIATE MEDICAL CARE IF:  You cough up bloody sputum that had not been present before. You develop fever of 102 F (38.9 C) or greater. You develop worsening pain at or near the incision site. MAKE SURE YOU:  Understand these instructions. Will watch your condition. Will get help right away if you are not doing well or get worse. Document Released: 08/22/2006 Document Revised: 07/04/2011 Document Reviewed: 10/23/2006 [employer] Patient Information 2014 [employer], [place].   ________________________________________________________________________  WHAT IS A BLOOD TRANSFUSION? Blood Transfusion Information  A transfusion is the replacement of blood or some of its parts. Blood is made up of multiple cells which provide different functions. Red blood cells carry oxygen and are used for blood loss replacement. White blood cells fight against infection. Platelets control bleeding. Plasma helps clot blood. Other blood products are available for specialized needs, such as hemophilia or other clotting disorders. BEFORE THE TRANSFUSION  Who gives blood for transfusions?  Healthy volunteers who are fully evaluated to make sure their blood is safe. This is blood bank blood. Transfusion therapy is the safest it has ever been in the practice of medicine. Before blood is taken from a donor, a complete history is taken to make sure that person has no history of diseases nor engages in risky social behavior (examples are intravenous drug use or sexual activity with multiple partners). The donor's travel history is screened to minimize risk of transmitting infections, such as malaria. The donated blood is tested for signs of infectious diseases, such as HIV and hepatitis. The  blood is then tested to be sure it is compatible with you in order to minimize the chance of a transfusion reaction. If you or a relative donates blood, this is often  done in anticipation of surgery and is not appropriate for emergency situations. It takes many days to process the donated blood. RISKS AND COMPLICATIONS Although transfusion therapy is very safe and saves many lives, the main dangers of transfusion include:  Getting an infectious disease. Developing a transfusion reaction. This is an allergic reaction to something in the blood you were given. Every precaution is taken to prevent this. The decision to have a blood transfusion has been considered carefully by your caregiver before blood is given. Blood is not given unless the benefits outweigh the risks. AFTER THE TRANSFUSION Right after receiving a blood transfusion, you will usually feel much better and more energetic. This is especially true if your red blood cells have gotten low (anemic). The transfusion raises the level of the red blood cells which carry oxygen, and this usually causes an energy increase. The nurse administering the transfusion will monitor you carefully for complications. HOME CARE INSTRUCTIONS  No special instructions are needed after a transfusion. You may find your energy is better. Speak with your caregiver about any limitations on activity for underlying diseases you may have. SEEK MEDICAL CARE IF:  Your condition is not improving after your transfusion. You develop redness or irritation at the intravenous (IV) site. SEEK IMMEDIATE MEDICAL CARE IF:  Any of the following symptoms occur over the next 12 hours: Shaking chills. You have a temperature by mouth above 102 F (38.9 C), not controlled by medicine. Chest, back, or muscle pain. People around you feel you are not acting correctly or are confused. Shortness of breath or difficulty breathing. Dizziness and fainting. You get a rash or develop hives. You have a decrease in urine output. Your urine turns a dark color or changes to pink, red, or brown. Any of the following symptoms occur over the next 10  days: You have a temperature by mouth above 102 F (38.9 C), not controlled by medicine. Shortness of breath. Weakness after normal activity. The white part of the eye turns yellow (jaundice). You have a decrease in the amount of urine or are urinating less often. Your urine turns a dark color or changes to pink, red, or brown. Document Released: 04/08/2000 Document Revised: 07/04/2011 Document Reviewed: 11/26/2007 [facility] Patient Information 2014 [place], [place].  _______________________________________________________________________

## 2021-02-02 ENCOUNTER — Other Ambulatory Visit (HOSPITAL_COMMUNITY): Payer: Self-pay

## 2021-02-03 ENCOUNTER — Encounter (HOSPITAL_COMMUNITY): Payer: Self-pay

## 2021-02-03 ENCOUNTER — Encounter (HOSPITAL_COMMUNITY)
Admission: RE | Admit: 2021-02-03 | Discharge: 2021-02-03 | Disposition: A | Discharge status: Home / Self Care | Payer: Medicare HMO | Source: Ambulatory Visit | Attending: General Surgery | Admitting: General Surgery

## 2021-02-03 ENCOUNTER — Other Ambulatory Visit: Payer: Self-pay

## 2021-02-03 DIAGNOSIS — Z01812 Encounter for preprocedural laboratory examination: Secondary | ICD-10-CM | POA: Insufficient documentation

## 2021-02-03 DIAGNOSIS — E119 Type 2 diabetes mellitus without complications: Secondary | ICD-10-CM | POA: Diagnosis not present

## 2021-02-03 LAB — COMPREHENSIVE METABOLIC PANEL
ALT: 48 U/L — ABNORMAL HIGH (ref 0–44)
AST: 36 U/L (ref 15–41)
Albumin: 4 g/dL (ref 3.5–5.0)
Alkaline Phosphatase: 70 U/L (ref 38–126)
Anion gap: 9 (ref 5–15)
BUN: 18 mg/dL (ref 6–20)
CO2: 27 mmol/L (ref 22–32)
Calcium: 9.7 mg/dL (ref 8.9–10.3)
Chloride: 105 mmol/L (ref 98–111)
Creatinine, Ser: 0.82 mg/dL (ref 0.44–1.00)
GFR, Estimated: >60 mL/min (ref >60)
Glucose, Bld: 104 mg/dL — ABNORMAL HIGH (ref 70–99)
Potassium: 4.3 mmol/L (ref 3.5–5.1)
Sodium: 141 mmol/L (ref 135–145)
Total Bilirubin: 0.5 mg/dL (ref 0.3–1.2)
Total Protein: 8 g/dL (ref 6.5–8.1)

## 2021-02-03 LAB — CBC WITH DIFFERENTIAL/PLATELET
Abs Immature Granulocytes: 0.03 10*3/uL (ref 0.00–0.07)
Basophils Absolute: 0 10*3/uL (ref 0.0–0.1)
Basophils Relative: 0 %
Eosinophils Absolute: 0.1 10*3/uL (ref 0.0–0.5)
Eosinophils Relative: 1 %
HCT: 43.2 % (ref 36.0–46.0)
Hemoglobin: 13.5 g/dL (ref 12.0–15.0)
Immature Granulocytes: 0 %
Lymphocytes Relative: 30 %
Lymphs Abs: 3 10*3/uL (ref 0.7–4.0)
MCH: 25.9 pg — ABNORMAL LOW (ref 26.0–34.0)
MCHC: 31.3 g/dL (ref 30.0–36.0)
MCV: 82.9 fL (ref 80.0–100.0)
Monocytes Absolute: 0.6 10*3/uL (ref 0.1–1.0)
Monocytes Relative: 6 %
Neutro Abs: 6.4 10*3/uL (ref 1.7–7.7)
Neutrophils Relative %: 63 %
Platelets: 271 10*3/uL (ref 150–400)
RBC: 5.21 MIL/uL — ABNORMAL HIGH (ref 3.87–5.11)
RDW: 15.2 % (ref 11.5–15.5)
WBC: 10.1 10*3/uL (ref 4.0–10.5)
nRBC: 0 % (ref 0.0–0.2)

## 2021-02-03 LAB — GLUCOSE, CAPILLARY: Glucose-Capillary: 117 mg/dL — ABNORMAL HIGH (ref 70–99)

## 2021-02-03 LAB — HEMOGLOBIN A1C
Hgb A1c MFr Bld: 6.8 % — ABNORMAL HIGH (ref 4.8–5.6)
Mean Plasma Glucose: 148.46 mg/dL

## 2021-02-03 NOTE — Progress Notes (Addendum)
PCP - Renda Rolls , NP  office currently closed Dr. Arbutus Ped away No current PCP Cardiologist - no  PPM/ICD -  Device Orders -  Rep Notified -   Chest x-ray - 05-08-20 epic EKG - 04-16-20 Stress Test -  ECHO -  Cardiac Cath -   Sleep Study -  CPAP -   Fasting Blood Sugar - 110's Checks Blood Sugar ___1__ times a day  Blood Thinner Instructions: Aspirin Instructions:  ERAS Protcol - PRE-SURGERY Ensure or G2-   COVID TEST- 02-11-21 COVID vaccine -Fully vaccinated x 3 moderna  Activity--Able to walk a flight of stairs without SOB if takes it slowly  Anesthesia review: DM  Patient denies shortness of breath, fever, cough and chest pain at PAT appointment   All instructions explained to the patient, with a verbal understanding of the material. Patient agrees to go over the instructions while at home for a better understanding. Patient also instructed to self quarantine after being tested for COVID-19. The opportunity to ask questions was provided.

## 2021-02-05 DIAGNOSIS — Z794 Long term (current) use of insulin: Secondary | ICD-10-CM | POA: Diagnosis not present

## 2021-02-05 DIAGNOSIS — E119 Type 2 diabetes mellitus without complications: Secondary | ICD-10-CM | POA: Diagnosis not present

## 2021-02-11 ENCOUNTER — Other Ambulatory Visit (HOSPITAL_COMMUNITY): Payer: Self-pay

## 2021-02-11 ENCOUNTER — Other Ambulatory Visit: Payer: Self-pay | Admitting: General Surgery

## 2021-02-11 LAB — SARS CORONAVIRUS 2 (TAT 6-24 HRS): SARS Coronavirus 2: POSITIVE — AB

## 2021-02-12 ENCOUNTER — Telehealth: Payer: Medicare HMO | Admitting: Physician Assistant

## 2021-02-12 DIAGNOSIS — U071 COVID-19: Secondary | ICD-10-CM | POA: Diagnosis not present

## 2021-02-12 NOTE — Patient Instructions (Signed)
Hello [name],  You are being placed in the home monitoring program for COVID-19 (commonly known as Coronavirus).  This is because you are suspected to have the virus or are known to have the virus.  If you are unsure which group you fall into call your clinic.    As part of this program, you'll answer a daily questionnaire in the MyChart mobile app. You'll receive a notification through the MyChart app when the questionnaire is available. When you log in to MyChart, you'll see the tasks in your To Do activity.       Clinicians will see any answers that are concerning and take appropriate steps.  If at any point you are having a medical emergency, call 911.  If otherwise concerned call your clinic instead of coming into the clinic or hospital.  To keep from spreading the disease you should: Stay home and limit contact with other people as much as possible.  Wash your hands frequently. Cover your coughs and sneezes with a tissue, and throw used tissues in the trash.   Clean and disinfect frequently touched surfaces and objects.    Take care of yourself by: Staying home Resting Drinking fluids Take fever-reducing medications (Tylenol/Acetaminophen and Ibuprofen)  For more information on the disease go to the Centers for Disease Control and Prevention website     COVID-19: Quarantine and Isolation Quarantine If you were exposed Quarantine and stay away from others when you have been in close contact with someone who has COVID-19. Isolate If you are sick or test positive Isolate when you are sick or when you have COVID-19, even if you don't have symptoms. When to stay home Calculating quarantine The date of your exposure is considered day 0. Day 1 is the first full day after your last contact with a person who has had COVID-19. Stay home and away from other people for at least 5 days. Learn why CDC updated guidance for the general public. IF YOU were exposed to COVID-19 and are NOT   up to dateIF YOU were exposed to COVID-19 and are NOT on COVID-19 vaccinations Quarantine for at least 5 days Stay home Stay home and quarantine for at least 5 full days. Wear a well-fitting mask if you must be around others in your home. Do not travel. Get tested Even if you don't develop symptoms, get tested at least 5 days after you last had close contact with someone with COVID-19. After quarantine Watch for symptoms Watch for symptoms until 10 days after you last had close contact with someone with COVID-19. Avoid travel It is best to avoid travel until a full 10 days after you last had close contact with someone with COVID-19. If you develop symptoms Isolate immediately and get tested. Continue to stay home until you know the results. Wear a well-fitting mask around others. Take precautions until day 10 Wear a well-fitting mask Wear a well-fitting mask for 10 full days any time you are around others inside your home or in public. Do not go to places where you are unable to wear a well-fitting mask. If you must travel during days 6-10, take precautions. Avoid being around people who are more likely to get very sick from COVID-19. IF YOU were exposed to COVID-19 and are  up to dateIF YOU were exposed to COVID-19 and are on COVID-19 vaccinations No quarantine You do not need to stay home unless you develop symptoms. Get tested Even if you don't develop symptoms, get tested at  least 5 days after you last had close contact with someone with COVID-19. Watch for symptoms Watch for symptoms until 10 days after you last had close contact with someone with COVID-19. If you develop symptoms Isolate immediately and get tested. Continue to stay home until you know the results. Wear a well-fitting mask around others. Take precautions until day 10 Wear a well-fitting mask Wear a well-fitting mask for 10 full days any time you are around others inside your home or in public. Do not go to  places where you are unable to wear a well-fitting mask. Take precautions if traveling Avoid being around people who are more likely to get very sick from COVID-19. IF YOU were exposed to COVID-19 and had confirmed COVID-19 within the past 90 days (you tested positive using a viral test) No quarantine You do not need to stay home unless you develop symptoms. Watch for symptoms Watch for symptoms until 10 days after you last had close contact with someone with COVID-19. If you develop symptoms Isolate immediately and get tested. Continue to stay home until you know the results. Wear a well-fitting mask around others. Take precautions until day 10 Wear a well-fitting mask Wear a well-fitting mask for 10 full days any time you are around others inside your home or in public. Do not go to places where you are unable to wear a well-fitting mask. Take precautions if traveling Avoid being around people who are more likely to get very sick from COVID-19. Calculating isolation Day 0 is your first day of symptoms or a positive viral test. Day 1 is the first full day after your symptoms developed or your test specimen was collected. If you have COVID-19 or have symptoms, isolate for at least 5 days. IF YOU tested positive for COVID-19 or have symptoms, regardless of vaccination status Stay home for at least 5 days Stay home for 5 days and isolate from others in your home. Wear a well-fitting mask if you must be around others in your home. Do not travel. Ending isolation if you had symptoms End isolation after 5 full days if you are fever-free for 24 hours (without the use of fever-reducing medication) and your symptoms are improving. Ending isolation if you did NOT have symptoms End isolation after at least 5 full days after your positive test. If you got very sick from COVID-19 or have a weakened immune system You should isolate for at least 10 days. Consult your doctor before ending  isolation. Take precautions until day 10 Wear a well-fitting mask Wear a well-fitting mask for 10 full days any time you are around others inside your home or in public. Do not go to places where you are unable to wear a well-fitting mask. Do not travel Do not travel until a full 10 days after your symptoms started or the date your positive test was taken if you had no symptoms. Avoid being around people who are more likely to get very sick from COVID-19. Definitions Exposure Contact with someone infected with SARS-CoV-2, the virus that causes COVID-19, in a way that increases the likelihood of getting infected with the virus. Close contact A close contact is someone who was less than 6 feet away from an infected person (laboratory-confirmed or a clinical diagnosis) for a cumulative total of 15 minutes or more over a 24-hour period. For example, three individual 5-minute exposures for a total of 15 minutes. People who are exposed to someone with COVID-19 after they completed at least  5 days of isolation are not considered close contacts. Julio Sicks is a strategy used to prevent transmission of COVID-19 by keeping people who have been in close contact with someone with COVID-19 apart from others. Who does not need to quarantine? If you had close contact with someone with COVID-19 and you are in one of the following groups, you do not need to quarantine. You are up to date with your COVID-19 vaccines. You had confirmed COVID-19 within the last 90 days (meaning you tested positive using a viral test). If you are up to date with COVID-19 vaccines, you should wear a well-fitting mask around others for 10 days from the date of your last close contact with someone with COVID-19 (the date of last close contact is considered day 0). Get tested at least 5 days after you last had close contact with someone with COVID-19. If you test positive or develop COVID-19 symptoms, isolate from other people  and follow recommendations in the Isolation section below. If you tested positive for COVID-19 with a viral test within the previous 90 days and subsequently recovered and remain without COVID-19 symptoms, you do not need to quarantine or get tested after close contact. You should wear a well-fitting mask around others for 10 days from the date of your last close contact with someone with COVID-19 (the date of last close contact is considered day 0). If you have COVID-19 symptoms, get tested and isolate from other people and follow recommendations in the Isolation section below. Who should quarantine? If you come into close contact with someone with COVID-19, you should quarantine if you are not up to date on COVID-19 vaccines. This includes people who are not vaccinated. What to do for quarantine Stay home and away from other people for at least 5 days (day 0 through day 5) after your last contact with a person who has COVID-19. The date of your exposure is considered day 0. Wear a well-fitting mask when around others at home, if possible. For 10 days after your last close contact with someone with COVID-19, watch for fever (100.48F or greater), cough, shortness of breath, or other COVID-19 symptoms. If you develop symptoms, get tested immediately and isolate until you receive your test results. If you test positive, follow isolation recommendations. If you do not develop symptoms, get tested at least 5 days after you last had close contact with someone with COVID-19. If you test negative, you can leave your home, but continue to wear a well-fitting mask when around others at home and in public until 10 days after your last close contact with someone with COVID-19. If you test positive, you should isolate for at least 5 days from the date of your positive test (if you do not have symptoms). If you do develop COVID-19 symptoms, isolate for at least 5 days from the date your symptoms began (the date the  symptoms started is day 0). Follow recommendations in the isolation section below. If you are unable to get a test 5 days after last close contact with someone with COVID-19, you can leave your home after day 5 if you have been without COVID-19 symptoms throughout the 5-day period. Wear a well-fitting mask for 10 days after your date of last close contact when around others at home and in public. Avoid people who are have weakened immune systems or are more likely to get very sick from COVID-19, and nursing homes and other high-risk settings, until after at least 10 days. If  possible, stay away from people you live with, especially people who are at higher risk for getting very sick from COVID-19, as well as others outside your home throughout the full 10 days after your last close contact with someone with COVID-19. If you are unable to quarantine, you should wear a well-fitting mask for 10 days when around others at home and in public. If you are unable to wear a mask when around others, you should continue to quarantine for 10 days. Avoid people who have weakened immune systems or are more likely to get very sick from COVID-19, and nursing homes and other high-risk settings, until after at least 10 days. See additional information about travel. Do not go to places where you are unable to wear a mask, such as restaurants and some gyms, and avoid eating around others at home and at work until after 10 days after your last close contact with someone with COVID-19. After quarantine Watch for symptoms until 10 days after your last close contact with someone with COVID-19. If you have symptoms, isolate immediately and get tested. Quarantine in high-risk congregate settings In certain congregate settings that have high risk of secondary transmission (such as Systems analyst and detention facilities, homeless shelters, or cruise ships), CDC recommends a 10-day quarantine for residents, regardless of vaccination  and booster status. During periods of critical staffing shortages, facilities may consider shortening the quarantine period for staff to ensure continuity of operations. Decisions to shorten quarantine in these settings should be made in consultation with state, local, tribal, or territorial health departments and should take into consideration the context and characteristics of the facility. CDC's setting-specific guidance provides additional recommendations for these settings. Isolation Isolation is used to separate people with confirmed or suspected COVID-19 from those without COVID-19. People who are in isolation should stay home until it's safe for them to be around others. At home, anyone sick or infected should separate from others, or wear a well-fitting mask when they need to be around others. People in isolation should stay in a specific "sick room" or area and use a separate bathroom if available. Everyone who has presumed or confirmed COVID-19 should stay home and isolate from other people for at least 5 full days (day 0 is the first day of symptoms or the date of the day of the positive viral test for asymptomatic persons). They should wear a mask when around others at home and in public for an additional 5 days. People who are confirmed to have COVID-19 or are showing symptoms of COVID-19 need to isolate regardless of their vaccination status. This includes: People who have a positive viral test for COVID-19, regardless of whether or not they have symptoms. People with symptoms of COVID-19, including people who are awaiting test results or have not been tested. People with symptoms should isolate even if they do not know if they have been in close contact with someone with COVID-19. What to do for isolation Monitor your symptoms. If you have an emergency warning sign (including trouble breathing), seek emergency medical care immediately. Stay in a separate room from other household members, if  possible. Use a separate bathroom, if possible. Take steps to improve ventilation at home, if possible. Avoid contact with other members of the household and pets. Don't share personal household items, like cups, towels, and utensils. Wear a well-fitting mask when you need to be around other people. Learn more about what to do if you are sick and how to notify your  contacts. Ending isolation for people who had COVID-19 and had symptoms If you had COVID-19 and had symptoms, isolate for at least 5 days. To calculate your 5-day isolation period, day 0 is your first day of symptoms. Day 1 is the first full day after your symptoms developed. You can leave isolation after 5 full days. You can end isolation after 5 full days if you are fever-free for 24 hours without the use of fever-reducing medication and your other symptoms have improved (Loss of taste and smell may persist for weeks or months after recovery and need not delay the end of isolation). You should continue to wear a well-fitting mask around others at home and in public for 5 additional days (day 6 through day 10) after the end of your 5-day isolation period. If you are unable to wear a mask when around others, you should continue to isolate for a full 10 days. Avoid people who have weakened immune systems or are more likely to get very sick from COVID-19, and nursing homes and other high-risk settings, until after at least 10 days. If you continue to have fever or your other symptoms have not improved after 5 days of isolation, you should wait to end your isolation until you are fever-free for 24 hours without the use of fever-reducing medication and your other symptoms have improved. Continue to wear a well-fitting mask through day 10. Contact your healthcare provider if you have questions. See additional information about travel. Do not go to places where you are unable to wear a mask, such as restaurants and some gyms, and avoid eating  around others at home and at work until a full 10 days after your first day of symptoms. If an individual has access to a test and wants to test, the best approach is to use an antigen test1 towards the end of the 5-day isolation period. Collect the test sample only if you are fever-free for 24 hours without the use of fever-reducing medication and your other symptoms have improved (loss of taste and smell may persist for weeks or months after recovery and need not delay the end of isolation). If your test result is positive, you should continue to isolate until day 10. If your test result is negative, you can end isolation, but continue to wear a well-fitting mask around others at home and in public until day 10. Follow additional recommendations for masking and avoiding travel as described above. 1As noted in the labeling for authorized over-the counter antigen tests: Negative results should be treated as presumptive. Negative results do not rule out SARS-CoV-2 infection and should not be used as the sole basis for treatment or patient management decisions, including infection control decisions. To improve results, antigen tests should be used twice over a three-day period with at least 24 hours and no more than 48 hours between tests. Note that these recommendations on ending isolation do not apply to people who are moderately ill or very sick from COVID-19 or have weakened immune systems. See section below for recommendations for when to end isolation for these groups. Ending isolation for people who tested positive for COVID-19 but had no symptoms If you test positive for COVID-19 and never develop symptoms, isolate for at least 5 days. Day 0 is the day of your positive viral test (based on the date you were tested) and day 1 is the first full day after the specimen was collected for your positive test. You can leave isolation after 5  full days. If you continue to have no symptoms, you can end isolation  after at least 5 days. You should continue to wear a well-fitting mask around others at home and in public until day 10 (day 6 through day 10). If you are unable to wear a mask when around others, you should continue to isolate for 10 days. Avoid people who have weakened immune systems or are more likely to get very sick from COVID-19, and nursing homes and other high-risk settings, until after at least 10 days. If you develop symptoms after testing positive, your 5-day isolation period should start over. Day 0 is your first day of symptoms. Follow the recommendations above for ending isolation for people who had COVID-19 and had symptoms. See additional information about travel. Do not go to places where you are unable to wear a mask, such as restaurants and some gyms, and avoid eating around others at home and at work until 10 days after the day of your positive test. If an individual has access to a test and wants to test, the best approach is to use an antigen test1 towards the end of the 5-day isolation period. If your test result is positive, you should continue to isolate until day 10. If your test result is positive, you can also choose to test daily and if your test result is negative, you can end isolation, but continue to wear a well-fitting mask around others at home and in public until day 10. Follow additional recommendations for masking and avoiding travel as described above. 1As noted in the labeling for authorized over-the counter antigen tests: Negative results should be treated as presumptive. Negative results do not rule out SARS-CoV-2 infection and should not be used as the sole basis for treatment or patient management decisions, including infection control decisions. To improve results, antigen tests should be used twice over a three-day period with at least 24 hours and no more than 48 hours between tests. Ending isolation for people who were moderately or very sick from COVID-19 or  have a weakened immune system People who are moderately ill from COVID-19 (experiencing symptoms that affect the lungs like shortness of breath or difficulty breathing) should isolate for 10 days and follow all other isolation precautions. To calculate your 10-day isolation period, day 0 is your first day of symptoms. Day 1 is the first full day after your symptoms developed. If you are unsure if your symptoms are moderate, talk to a healthcare provider for further guidance. People who are very sick from COVID-19 (this means people who were hospitalized or required intensive care or ventilation support) and people who have weakened immune systems might need to isolate at home longer. They may also require testing with a viral test to determine when they can be around others. CDC recommends an isolation period of at least 10 and up to 20 days for people who were very sick from COVID-19 and for people with weakened immune systems. Consult with your healthcare provider about when you can resume being around other people. If you are unsure if your symptoms are severe or if you have a weakened immune system, talk to a healthcare provider for further guidance. People who have a weakened immune system should talk to their healthcare provider about the potential for reduced immune responses to COVID-19 vaccines and the need to continue to follow current prevention measures (including wearing a well-fitting mask and avoiding crowds and poorly ventilated indoor spaces) to protect themselves against COVID-19  until advised otherwise by their healthcare provider. Close contacts of immunocompromised people--including household members--should also be encouraged to receive all recommended COVID-19 vaccine doses to help protect these people. Isolation in high-risk congregate settings In certain high-risk congregate settings that have high risk of secondary transmission and where it is not feasible to cohort people (such as  Systems analyst and detention facilities, homeless shelters, and cruise ships), CDC recommends a 10-day isolation period for residents. During periods of critical staffing shortages, facilities may consider shortening the isolation period for staff to ensure continuity of operations. Decisions to shorten isolation in these settings should be made in consultation with state, local, tribal, or territorial health departments and should take into consideration the context and characteristics of the facility. CDC's setting-specific guidance provides additional recommendations for these settings. This CDC guidance is meant to supplement--not replace--any federal, state, local, territorial, or tribal health and safety laws, rules, and regulations. Recommendations for specific settings These recommendations do not apply to healthcare professionals. For guidance specific to these settings, see Healthcare professionals: Interim Guidance for Optician, dispensing with SARS-CoV-2 Infection or Exposure to SARS-CoV-2 Patients, residents, and visitors to healthcare settings: Interim Infection Prevention and Control Recommendations for Healthcare Personnel During the [place] 2019 (COVID-19) Pandemic Additional setting-specific guidance and recommendations are available. These recommendations on quarantine and isolation do apply to [place] settings. Additional guidance is available here: Overview of COVID-19 Quarantine for K-12 Schools Travelers: Travel information and recommendations Congregate facilities and other settings: [employer] for community, work, and school settings Ongoing COVID-19 exposure FAQs I live with someone with COVID-19, but I cannot be separated from them. How do we manage quarantine in this situation? It is very important for people with COVID-19 to remain apart from other people, if possible, even if they are living together. If separation of the person with COVID-19 from  others that they live with is not possible, the other people that they live with will have ongoing exposure, meaning they will be repeatedly exposed until that person is no longer able to spread the virus to other people. In this situation, there are precautions you can take to limit the spread of COVID-19: The person with COVID-19 and everyone they live with should wear a well-fitting mask inside the home. If possible, one person should care for the person with COVID-19 to limit the number of people who are in close contact with the infected person. Take steps to protect yourself and others to reduce transmission in the home: Quarantine if you are not up to date with your COVID-19 vaccines. Isolate if you are sick or tested positive for COVID-19, even if you don't have symptoms. Learn more about the public health recommendations for testing, mask use and quarantine of close contacts, like yourself, who have ongoing exposure. These recommendations differ depending on your vaccination status. What should I do if I have ongoing exposure to COVID-19 from someone I live with? Recommendations for this situation depend on your vaccination status: If you are not up to date on COVID-19 vaccines and have ongoing exposure to COVID-19, you should: Begin quarantine immediately and continue to quarantine throughout the isolation period of the person with COVID-19. Continue to quarantine for an additional 5 days starting the day after the end of isolation for the person with COVID-19. Get tested at least 5 days after the end of isolation of the infected person that lives with them. If you test negative, you can leave the home but should continue to wear  a well-fitting mask when around others at home and in public until 10 days after the end of isolation for the person with COVID-19. Isolate immediately if you develop symptoms of COVID-19 or test positive. If you are up to date with COVID-19 vaccines and have  ongoing exposure to COVID-19, you should: Get tested at least 5 days after your first exposure. A person with COVID-19 is considered infectious starting 2 days before they develop symptoms, or 2 days before the date of their positive test if they do not have symptoms. Get tested again at least 5 days after the end of isolation for the person with COVID-19. Wear a well-fitting mask when you are around the person with COVID-19, and do this throughout their isolation period. Wear a well-fitting mask around others for 10 days after the infected person's isolation period ends. Isolate immediately if you develop symptoms of COVID-19 or test positive. What should I do if multiple people I live with test positive for COVID-19 at different times? Recommendations for this situation depend on your vaccination status: If you are not up to date with your COVID-19 vaccines, you should: Quarantine throughout the isolation period of any infected person that you live with. Continue to quarantine until 5 days after the end of isolation date for the most recently infected person that lives with you. For example, if the last day of isolation of the person most recently infected with COVID-19 was June 30, the new 5-day quarantine period starts on July 1. Get tested at least 5 days after the end of isolation for the most recently infected person that lives with you. Wear a well-fitting mask when you are around any person with COVID-19 while that person is in isolation. Wear a well-fitting mask when you are around other people until 10 days after your last close contact. Isolate immediately if you develop symptoms of COVID-19 or test positive. If you are up to date with your COVID-19 vaccines, you should: Get tested at least 5 days after your first exposure. A person with COVID-19 is considered infectious starting 2 days before they developed symptoms, or 2 days before the date of their positive test if they do not have  symptoms. Get tested again at least 5 days after the end of isolation for the most recently infected person that lives with you. Wear a well-fitting mask when you are around any person with COVID-19 while that person is in isolation. Wear a well-fitting mask around others for 10 days after the end of isolation for the most recently infected person that lives with you. For example, if the last day of isolation for the person most recently infected with COVID-19 was June 30, the new 10-day period to wear a well-fitting mask indoors in public starts on July 1. Isolate immediately if you develop symptoms of COVID-19 or test positive. I had COVID-19 and completed isolation. Do I have to quarantine or get tested if someone I live with gets COVID-19 shortly after I completed isolation? No. If you recently completed isolation and someone that lives with you tests positive for the virus that causes COVID-19 shortly after the end of your isolation period, you do not have to quarantine or get tested as long as you do not develop new symptoms. Once all of the people that live together have completed isolation or quarantine, refer to the guidance below for new exposures to COVID-19. If you had COVID-19 in the previous 90 days and then came into close contact with  someone with COVID-19, you do not have to quarantine or get tested if you do not have symptoms. But you should: Wear a well-fitting mask indoors in public for 10 days after your last close contact. Monitor for COVID-19 symptoms for 10 days from the date of your last close contact. Isolate immediately and get tested if symptoms develop. If more than 90 days have passed since your recovery from infection, follow CDC's recommendations for close contacts. These recommendations will differ depending on your vaccination status. 07/22/2020 Content source: [facility] for Immunization and Respiratory Diseases ([facility]), Division of Viral Diseases This  information is not intended to replace advice given to you by your health care provider. Make sure you discuss any questions you have with your health care provider. Document Revised: 08/27/2020 Document Reviewed: 08/27/2020 Elsevier Patient Education  2022 [employer].

## 2021-02-12 NOTE — Progress Notes (Signed)
Virtual Visit Consent   [name], you are scheduled for a virtual visit with a [facility] provider today.     Just as with appointments in the office, your consent must be obtained to participate.  Your consent will be active for this visit and any virtual visit you may have with one of our providers in the next 365 days.     If you have a MyChart account, a copy of this consent can be sent to you electronically.  All virtual visits are billed to your insurance company just like a traditional visit in the office.    As this is a virtual visit, video technology does not allow for your provider to perform a traditional examination.  This may limit your provider's ability to fully assess your condition.  If your provider identifies any concerns that need to be evaluated in person or the need to arrange testing (such as labs, EKG, etc.), we will make arrangements to do so.     Although advances in technology are sophisticated, we cannot ensure that it will always work on either your end or our end.  If the connection with a video visit is poor, the visit may have to be switched to a telephone visit.  With either a video or telephone visit, we are not always able to ensure that we have a secure connection.     I need to obtain your verbal consent now.   Are you willing to proceed with your visit today?    DIOR DOMINIK has provided verbal consent on 02/12/2021 for a virtual visit (video or telephone).   Mar Daring, PA-C   Date: 02/12/2021 3:40 PM   Virtual Visit via Video Note   I, Mar Daring, connected with  [name]  ([id number], [birth year]) on 02/12/21 at  3:30 PM EDT by a video-enabled telemedicine application and verified that I am speaking with the correct person using two identifiers.  Location: Patient: Virtual Visit Location Patient: Home Provider: Virtual Visit Location Provider: Home Office   I discussed the limitations of evaluation  and management by telemedicine and the availability of in person appointments. The patient expressed understanding and agreed to proceed.    History of Present Illness: [name] is a 35 y.o. who identifies as a female who was assigned female at birth, and is being seen today for Covid 13.  HPI: URI  This is a new problem. Episode onset: tested positive for covid 19; not having symptoms. The problem has been gradually worsening. There has been no fever.   Patient is having no symptoms and tested positive for covid 19 on pre-op testing planning for bariatric surgery.  Problems:  Patient Active Problem List   Diagnosis Date Noted   Type 2 diabetes mellitus with hyperglycemia, without long-term current use of insulin ([place]) 09/30/2020   History of gestational hypertension    Sepsis ([place]) 10/21/2017   Left leg cellulitis 10/21/2017   Carpal tunnel syndrome on left 07/16/2017   Reduced vision 02/11/2015   PCO (polycystic ovaries) 01/15/2014   Heavy periods 81/19/1478   Metabolic syndrome X 29/56/2130   Hyperlipidemia LDL goal <100 02/13/2013   Prediabetes 02/13/2013   Anemia 11/08/2011   Eczema 03/31/2011   Depression 12/21/2010   Vitamin D deficiency 12/15/2010   Morbid obesity ([place]) 02/05/2009   Lymphedema of left lower extremity 02/05/2009    Allergies:  Allergies  Allergen Reactions   Bee Venom Anaphylaxis and Swelling  Septra [Sulfamethoxazole-Trimethoprim] Other (See Comments)    Vomit and loose stools   Other Itching, Rash and Other (See Comments)    Cannot tolerate any med that end in -CILLIN; also develops listers on her scalp   Penicillins Rash    Has patient had a PCN reaction causing immediate rash, facial/tongue/throat swelling, SOB or lightheadedness with hypotension: yes Has patient had a PCN reaction causing severe rash involving mucus membranes or skin necrosis: Unknown Has patient had a PCN reaction that required hospitalization: Unknown Has patient  had a PCN reaction occurring within the last 10 years: yes If all of the above answers are "NO", then may proceed with Cephalosporin use. Tolerated Cefepime and Rocephin in the past.   Vancomycin Itching and Rash   Medications:  Current Outpatient Medications:    blood glucose meter kit and supplies KIT, Dispense based on patient and insurance preference. Use to check your blood sugar once daily in the morning before your first meal., Disp: 1 each, Rfl: 0   Blood Pressure Monitor MISC, For regular home bp monitoring during pregnancy (Patient not taking: Reported on 10/29/2020), Disp: 1 each, Rfl: 0   hydrocerin (EUCERIN) CREA, Apply 1 application topically 2 (two) times daily. (Patient not taking: Reported on 01/29/2021), Disp: 454 g, Rfl: 0   ibuprofen (ADVIL) 600 MG tablet, Take 1 tablet (600 mg total) by mouth every 6 (six) hours. (Patient not taking: No sig reported), Disp: 30 tablet, Rfl: 0   metFORMIN (GLUCOPHAGE) 500 MG tablet, Take 1 tablet (500 mg total) by mouth daily with breakfast., Disp: 90 tablet, Rfl: 0   norethindrone (ORTHO MICRONOR) 0.35 MG tablet, Take 1 tablet (0.35 mg total) by mouth daily. (Patient not taking: Reported on 01/29/2021), Disp: 1 Package, Rfl: 11  Observations/Objective: Patient is well-developed, well-nourished in no acute distress.  Resting comfortably at home.  Head is normocephalic, atraumatic.  No labored breathing.  Speech is clear and coherent with logical content.  Patient is alert and oriented at baseline.    Assessment and Plan: 1. COVID-19 - MyChart COVID-19 home monitoring program; Future  - Patient is asymptomatic currently - Isolate for 5 days - Call back if symptoms develop to discuss antiviral treatment - All questions answered for patient  Follow Up Instructions: I discussed the assessment and treatment plan with the patient. The patient was provided an opportunity to ask questions and all were answered. The patient agreed with the plan  and demonstrated an understanding of the instructions.  A copy of instructions were sent to the patient via MyChart unless otherwise noted below.    The patient was advised to call back or seek an in-person evaluation if the symptoms worsen or if the condition fails to improve as anticipated.  Time:  I spent 10 minutes with the patient via telehealth technology discussing the above problems/concerns.    Mar Daring, PA-C

## 2021-02-15 ENCOUNTER — Inpatient Hospital Stay (HOSPITAL_COMMUNITY): Admission: EM | Admit: 2021-02-15 | Payer: Medicare HMO | Source: Ambulatory Visit | Admitting: General Surgery

## 2021-02-15 ENCOUNTER — Encounter (HOSPITAL_COMMUNITY): Admission: EM | Payer: Self-pay | Source: Ambulatory Visit

## 2021-02-15 LAB — TYPE AND SCREEN
ABO/RH(D): B POS
Antibody Screen: NEGATIVE

## 2021-02-15 SURGERY — LAPAROSCOPIC ROUX-EN-Y GASTRIC BYPASS WITH UPPER ENDOSCOPY
Anesthesia: General

## 2021-02-16 ENCOUNTER — Other Ambulatory Visit (HOSPITAL_COMMUNITY): Payer: Self-pay

## 2021-03-02 ENCOUNTER — Ambulatory Visit: Payer: Medicare HMO

## 2021-03-08 ENCOUNTER — Ambulatory Visit: Payer: Self-pay | Admitting: General Surgery

## 2021-03-10 ENCOUNTER — Other Ambulatory Visit (HOSPITAL_COMMUNITY): Payer: Self-pay

## 2021-03-12 NOTE — Progress Notes (Addendum)
COVID swab appointment: n/a COVID pos 02/11/21  COVID Vaccine Completed: yes x3  Date COVID Vaccine completed: 11/11/19, 12/15/19 Has received booster: 04/30/20 COVID vaccine manufacturer: Pfizer    Moderna   [employer]   Date of COVID positive in last 90 days: yes positive 02/11/21 in Epic  PCP - Jiles Prows, NP Cardiologist - n/a  Chest x-ray - 05/08/20 Epic EKG - 04/16/20 Epic Stress Test - n/a ECHO - n/a Cardiac Cath - n/a Pacemaker/ICD device last checked:n/a Spinal Cord Stimulator: n/a  Sleep Study - n/a CPAP -   Fasting Blood Sugar - 70-120 Checks Blood Sugar _1_ times a day  Blood Thinner Instructions:n/a Aspirin Instructions: Last Dose:  Activity level: Can go up a flight of stairs and perform activities of daily living without stopping and without symptoms of chest pain or shortness of breath.      Anesthesia review:   Patient denies shortness of breath, fever, cough and chest pain at PAT appointment   Patient verbalized understanding of instructions that were given to them at the PAT appointment. Patient was also instructed that they will need to review over the PAT instructions again at home before surgery.

## 2021-03-12 NOTE — Patient Instructions (Addendum)
DUE TO COVID-19 ONLY ONE VISITOR IS ALLOWED TO COME WITH YOU AND STAY IN THE WAITING ROOM ONLY DURING PRE OP AND PROCEDURE.   **NO VISITORS ARE ALLOWED IN THE SHORT STAY AREA OR RECOVERY ROOM!!**  IF YOU WILL BE ADMITTED INTO THE HOSPITAL YOU ARE ALLOWED ONLY TWO SUPPORT PEOPLE DURING VISITATION HOURS ONLY (7AM -8PM)   The support person(s) may change daily. The support person(s) must pass our screening, gel in and out, and wear a mask at all times, including in the patient's room. Patients must also wear a mask when staff or their support person are in the room.  No visitors under the age of 61. Any visitor under the age of 67 must be accompanied by an adult.        Your procedure is scheduled on: 04/05/21   Report to [facility] Main Entrance    Report to admitting at 5:15 AM   Call this number if you have problems the morning of surgery [phone]   MORNING OF SURGERY DRINK:   DRINK 1 G2 drink BEFORE YOU LEAVE HOME, DRINK ALL OF THE  G2 DRINK AT ONE TIME.   NO SOLID FOOD AFTER 600 PM THE NIGHT BEFORE YOUR SURGERY. YOU MAY DRINK CLEAR FLUIDS. THE G2 DRINK YOU DRINK BEFORE YOU LEAVE HOME WILL BE THE LAST FLUIDS YOU DRINK BEFORE SURGERY.  PAIN IS EXPECTED AFTER SURGERY AND WILL NOT BE COMPLETELY ELIMINATED. AMBULATION AND TYLENOL WILL HELP REDUCE INCISIONAL AND GAS PAIN. MOVEMENT IS KEY!  YOU ARE EXPECTED TO BE OUT OF BED WITHIN 4 HOURS OF ADMISSION TO YOUR PATIENT ROOM.  SITTING IN THE RECLINER THROUGHOUT THE DAY IS IMPORTANT FOR DRINKING FLUIDS AND MOVING GAS THROUGHOUT THE GI TRACT.  COMPRESSION STOCKINGS SHOULD BE WORN [facility] STAY UNLESS YOU ARE WALKING.   INCENTIVE SPIROMETER SHOULD BE USED EVERY HOUR WHILE AWAKE TO DECREASE POST-OPERATIVE COMPLICATIONS SUCH AS PNEUMONIA.  WHEN DISCHARGED HOME, IT IS IMPORTANT TO CONTINUE TO WALK EVERY HOUR AND USE THE INCENTIVE SPIROMETER EVERY HOUR.    May have liquids until 4:15 AM day of surgery  CLEAR LIQUID  DIET  Foods Allowed                                                                     Foods Excluded  Water, Black Coffee and tea (no milk or creamer)          liquids that you cannot  Plain Jell-O in any flavor  (No red)                                   see through such as: Fruit ices (not with fruit pulp)                                           milk, soups, orange juice              Iced Popsicles (No red)  All solid food                                   Apple juices Sports drinks like Gatorade (No red) Lightly seasoned clear broth or consume(fat free) Sugar  Sample Menu Breakfast                                Lunch                                     Supper Cranberry juice                    Beef broth                            Chicken broth Jell-O                                     Grape juice                           Apple juice Coffee or tea                        Jell-O                                      Popsicle                                                Coffee or tea                        Coffee or tea      The day of surgery:  Drink ONE (1) Pre-Surgery G2 by 4:15 am the morning of surgery. Drink in one sitting. Do not sip.  This drink was given to you during your hospital  pre-op appointment visit. Nothing else to drink after completing the  Pre-Surgery G2.          If you have questions, please contact your surgeon's office.     Oral Hygiene is also important to reduce your risk of infection.                                    Remember - BRUSH YOUR TEETH THE MORNING OF SURGERY WITH YOUR REGULAR TOOTHPASTE   Take these medicines the morning of surgery with A SIP OF WATER: None  DO NOT TAKE ANY ORAL DIABETIC MEDICATIONS DAY OF YOUR SURGERY How to Manage Your Diabetes Before and After Surgery  Why is it important to control my blood sugar before and after surgery? Improving blood sugar levels before and  after surgery helps healing and can limit problems. A way of improving blood sugar control is eating a healthy diet by:  Eating  less sugar and carbohydrates  Increasing activity/exercise  Talking with your doctor about reaching your blood sugar goals High blood sugars (greater than 180 mg/dL) can raise your risk of infections and slow your recovery, so you will need to focus on controlling your diabetes during the weeks before surgery. Make sure that the doctor who takes care of your diabetes knows about your planned surgery including the date and location.  How do I manage my blood sugar before surgery? Check your blood sugar at least 4 times a day, starting 2 days before surgery, to make sure that the level is not too high or low. Check your blood sugar the morning of your surgery when you wake up and every 2 hours until you get to the Short Stay unit. If your blood sugar is less than 70 mg/dL, you will need to treat for low blood sugar: Do not take insulin. Treat a low blood sugar (less than 70 mg/dL) with  cup of clear juice (cranberry or apple), 4 glucose tablets, OR glucose gel. Recheck blood sugar in 15 minutes after treatment (to make sure it is greater than 70 mg/dL). If your blood sugar is not greater than 70 mg/dL on recheck, call [phone] for further instructions. Report your blood sugar to the short stay nurse when you get to Short Stay.  If you are admitted to the hospital after surgery: Your blood sugar will be checked by the staff and you will probably be given insulin after surgery (instead of oral diabetes medicines) to make sure you have good blood sugar levels. The goal for blood sugar control after surgery is 80-180 mg/dL.   WHAT DO I DO ABOUT MY DIABETES MEDICATION?  Do not take oral diabetes medicines (pills) the morning of surgery.  THE DAY BEFORE SURGERY, take Metformin as prescribed       THE MORNING OF SURGERY, do not take Metformin   Reviewed and  Endorsed by [facility] Patient Education Committee, August 2015                               You may not have any metal on your body including hair pins, jewelry, and body piercing             Do not wear make-up, lotions, powders, perfumes, or deodorant  Do not wear nail polish including gel and S&S, artificial/acrylic nails, or any other type of covering on natural nails including finger and toenails. If you have artificial nails, gel coating, etc. that needs to be removed by a nail salon please have this removed prior to surgery or surgery may need to be canceled/ delayed if the surgeon/ anesthesia feels like they are unable to be safely monitored.   Do not shave  48 hours prior to surgery.    Do not bring valuables to the hospital. [facility] IS NOT             RESPONSIBLE   FOR VALUABLES.   Contacts, dentures or bridgework may not be worn into surgery.   Bring small overnight bag day of surgery.              Please read over the following fact sheets you were given: IF YOU HAVE QUESTIONS ABOUT YOUR PRE-OP INSTRUCTIONS PLEASE CALL [phone]- [facility] - Preparing for Surgery Before surgery, you can play an important role.  Because skin is not sterile, your skin needs  to be as free of germs as possible.  You can reduce the number of germs on your skin by washing with CHG (chlorahexidine gluconate) soap before surgery.  CHG is an antiseptic cleaner which kills germs and bonds with the skin to continue killing germs even after washing. Please DO NOT use if you have an allergy to CHG or antibacterial soaps.  If your skin becomes reddened/irritated stop using the CHG and inform your nurse when you arrive at Short Stay. Do not shave (including legs and underarms) for at least 48 hours prior to the first CHG shower.  You may shave your face/neck.  Please follow these instructions carefully:  1.  Shower with CHG Soap the night before surgery and the  morning of surgery.  2.  If  you choose to wash your hair, wash your hair first as usual with your normal  shampoo.  3.  After you shampoo, rinse your hair and body thoroughly to remove the shampoo.                             4.  Use CHG as you would any other liquid soap.  You can apply chg directly to the skin and wash.  Gently with a scrungie or clean washcloth.  5.  Apply the CHG Soap to your body ONLY FROM THE NECK DOWN.   Do   not use on face/ open                           Wound or open sores. Avoid contact with eyes, ears mouth and   genitals (private parts).                       Wash face,  Genitals (private parts) with your normal soap.             6.  Wash thoroughly, paying special attention to the area where your    surgery  will be performed.  7.  Thoroughly rinse your body with warm water from the neck down.  8.  DO NOT shower/wash with your normal soap after using and rinsing off the CHG Soap.                9.  Pat yourself dry with a clean towel.            10.  Wear clean pajamas.            11.  Place clean sheets on your bed the night of your first shower and do not  sleep with pets. Day of Surgery : Do not apply any lotions/deodorants the morning of surgery.  Please wear clean clothes to the hospital/surgery center.  FAILURE TO FOLLOW THESE INSTRUCTIONS MAY RESULT IN THE CANCELLATION OF YOUR SURGERY  PATIENT SIGNATURE_________________________________  NURSE SIGNATURE__________________________________  ________________________________________________________________________   [name]  An incentive spirometer is a tool that can help keep your lungs clear and active. This tool measures how well you are filling your lungs with each breath. Taking long deep breaths may help reverse or decrease the chance of developing breathing (pulmonary) problems (especially infection) following: A long period of time when you are unable to move or be active. BEFORE THE PROCEDURE  If the spirometer  includes an indicator to show your best effort, your nurse or respiratory therapist will set it to a desired goal. If  possible, sit up straight or lean slightly forward. Try not to slouch. Hold the incentive spirometer in an upright position. INSTRUCTIONS FOR USE  Sit on the edge of your bed if possible, or sit up as far as you can in bed or on a chair. Hold the incentive spirometer in an upright position. Breathe out normally. Place the mouthpiece in your mouth and seal your lips tightly around it. Breathe in slowly and as deeply as possible, raising the piston or the ball toward the top of the column. Hold your breath for 3-5 seconds or for as long as possible. Allow the piston or ball to fall to the bottom of the column. Remove the mouthpiece from your mouth and breathe out normally. Rest for a few seconds and repeat Steps 1 through 7 at least 10 times every 1-2 hours when you are awake. Take your time and take a few normal breaths between deep breaths. The spirometer may include an indicator to show your best effort. Use the indicator as a goal to work toward during each repetition. After each set of 10 deep breaths, practice coughing to be sure your lungs are clear. If you have an incision (the cut made at the time of surgery), support your incision when coughing by placing a pillow or rolled up towels firmly against it. Once you are able to get out of bed, walk around indoors and cough well. You may stop using the incentive spirometer when instructed by your caregiver.  RISKS AND COMPLICATIONS Take your time so you do not get dizzy or light-headed. If you are in pain, you may need to take or ask for pain medication before doing incentive spirometry. It is harder to take a deep breath if you are having pain. AFTER USE Rest and breathe slowly and easily. It can be helpful to keep track of a log of your progress. Your caregiver can provide you with a simple table to help with this. If you are  using the spirometer at home, follow these instructions: SEEK MEDICAL CARE IF:  You are having difficultly using the spirometer. You have trouble using the spirometer as often as instructed. Your pain medication is not giving enough relief while using the spirometer. You develop fever of 100.5 F (38.1 C) or higher. SEEK IMMEDIATE MEDICAL CARE IF:  You cough up bloody sputum that had not been present before. You develop fever of 102 F (38.9 C) or greater. You develop worsening pain at or near the incision site. MAKE SURE YOU:  Understand these instructions. Will watch your condition. Will get help right away if you are not doing well or get worse. Document Released: 08/22/2006 Document Revised: 07/04/2011 Document Reviewed: 10/23/2006 [employer] Patient Information 2014 [employer], [place].   ________________________________________________________________________  WHAT IS A BLOOD TRANSFUSION? Blood Transfusion Information  A transfusion is the replacement of blood or some of its parts. Blood is made up of multiple cells which provide different functions. Red blood cells carry oxygen and are used for blood loss replacement. White blood cells fight against infection. Platelets control bleeding. Plasma helps clot blood. Other blood products are available for specialized needs, such as hemophilia or other clotting disorders. BEFORE THE TRANSFUSION  Who gives blood for transfusions?  Healthy volunteers who are fully evaluated to make sure their blood is safe. This is blood bank blood. Transfusion therapy is the safest it has ever been in the practice of medicine. Before blood is taken from a donor, a complete history is taken to  make sure that person has no history of diseases nor engages in risky social behavior (examples are intravenous drug use or sexual activity with multiple partners). The donor's travel history is screened to minimize risk of transmitting infections, such as malaria. The  donated blood is tested for signs of infectious diseases, such as HIV and hepatitis. The blood is then tested to be sure it is compatible with you in order to minimize the chance of a transfusion reaction. If you or a relative donates blood, this is often done in anticipation of surgery and is not appropriate for emergency situations. It takes many days to process the donated blood. RISKS AND COMPLICATIONS Although transfusion therapy is very safe and saves many lives, the main dangers of transfusion include:  Getting an infectious disease. Developing a transfusion reaction. This is an allergic reaction to something in the blood you were given. Every precaution is taken to prevent this. The decision to have a blood transfusion has been considered carefully by your caregiver before blood is given. Blood is not given unless the benefits outweigh the risks. AFTER THE TRANSFUSION Right after receiving a blood transfusion, you will usually feel much better and more energetic. This is especially true if your red blood cells have gotten low (anemic). The transfusion raises the level of the red blood cells which carry oxygen, and this usually causes an energy increase. The nurse administering the transfusion will monitor you carefully for complications. HOME CARE INSTRUCTIONS  No special instructions are needed after a transfusion. You may find your energy is better. Speak with your caregiver about any limitations on activity for underlying diseases you may have. SEEK MEDICAL CARE IF:  Your condition is not improving after your transfusion. You develop redness or irritation at the intravenous (IV) site. SEEK IMMEDIATE MEDICAL CARE IF:  Any of the following symptoms occur over the next 12 hours: Shaking chills. You have a temperature by mouth above 102 F (38.9 C), not controlled by medicine. Chest, back, or muscle pain. People around you feel you are not acting correctly or are confused. Shortness of  breath or difficulty breathing. Dizziness and fainting. You get a rash or develop hives. You have a decrease in urine output. Your urine turns a dark color or changes to pink, red, or brown. Any of the following symptoms occur over the next 10 days: You have a temperature by mouth above 102 F (38.9 C), not controlled by medicine. Shortness of breath. Weakness after normal activity. The white part of the eye turns yellow (jaundice). You have a decrease in the amount of urine or are urinating less often. Your urine turns a dark color or changes to pink, red, or brown. Document Released: 04/08/2000 Document Revised: 07/04/2011 Document Reviewed: 11/26/2007 [facility] Patient Information 2014 [place], [place].  _______________________________________________________________________

## 2021-03-15 ENCOUNTER — Encounter (HOSPITAL_COMMUNITY): Payer: Self-pay

## 2021-03-15 ENCOUNTER — Encounter (HOSPITAL_COMMUNITY)
Admission: RE | Admit: 2021-03-15 | Discharge: 2021-03-15 | Disposition: A | Discharge status: Home / Self Care | Payer: Medicare HMO | Source: Ambulatory Visit | Attending: General Surgery | Admitting: General Surgery

## 2021-03-15 ENCOUNTER — Other Ambulatory Visit: Payer: Self-pay

## 2021-03-15 VITALS — BP 121/86 | HR 72 | Temp 98.0°F | Resp 16 | Ht 69.0 in | Wt 348.2 lb

## 2021-03-15 DIAGNOSIS — Z01812 Encounter for preprocedural laboratory examination: Secondary | ICD-10-CM | POA: Diagnosis not present

## 2021-03-15 DIAGNOSIS — E1165 Type 2 diabetes mellitus with hyperglycemia: Secondary | ICD-10-CM | POA: Diagnosis not present

## 2021-03-15 DIAGNOSIS — Z6841 Body Mass Index (BMI) 40.0 and over, adult: Secondary | ICD-10-CM | POA: Insufficient documentation

## 2021-03-15 LAB — CBC WITH DIFFERENTIAL/PLATELET
Abs Immature Granulocytes: 0.02 10*3/uL (ref 0.00–0.07)
Basophils Absolute: 0 10*3/uL (ref 0.0–0.1)
Basophils Relative: 0 %
Eosinophils Absolute: 0 10*3/uL (ref 0.0–0.5)
Eosinophils Relative: 0 %
HCT: 39.4 % (ref 36.0–46.0)
Hemoglobin: 12.2 g/dL (ref 12.0–15.0)
Immature Granulocytes: 0 %
Lymphocytes Relative: 32 %
Lymphs Abs: 3.3 10*3/uL (ref 0.7–4.0)
MCH: 25.5 pg — ABNORMAL LOW (ref 26.0–34.0)
MCHC: 31 g/dL (ref 30.0–36.0)
MCV: 82.4 fL (ref 80.0–100.0)
Monocytes Absolute: 0.6 10*3/uL (ref 0.1–1.0)
Monocytes Relative: 6 %
Neutro Abs: 6.2 10*3/uL (ref 1.7–7.7)
Neutrophils Relative %: 62 %
Platelets: 271 10*3/uL (ref 150–400)
RBC: 4.78 MIL/uL (ref 3.87–5.11)
RDW: 14.9 % (ref 11.5–15.5)
WBC: 10.2 10*3/uL (ref 4.0–10.5)
nRBC: 0 % (ref 0.0–0.2)

## 2021-03-15 LAB — COMPREHENSIVE METABOLIC PANEL
ALT: 19 U/L (ref 0–44)
AST: 16 U/L (ref 15–41)
Albumin: 3.5 g/dL (ref 3.5–5.0)
Alkaline Phosphatase: 82 U/L (ref 38–126)
Anion gap: 6 (ref 5–15)
BUN: 13 mg/dL (ref 6–20)
CO2: 25 mmol/L (ref 22–32)
Calcium: 8.9 mg/dL (ref 8.9–10.3)
Chloride: 108 mmol/L (ref 98–111)
Creatinine, Ser: 0.85 mg/dL (ref 0.44–1.00)
GFR, Estimated: >60 mL/min (ref >60)
Glucose, Bld: 96 mg/dL (ref 70–99)
Potassium: 4 mmol/L (ref 3.5–5.1)
Sodium: 139 mmol/L (ref 135–145)
Total Bilirubin: 0.4 mg/dL (ref 0.3–1.2)
Total Protein: 7.3 g/dL (ref 6.5–8.1)

## 2021-03-15 LAB — HEMOGLOBIN A1C
Hgb A1c MFr Bld: 5.9 % — ABNORMAL HIGH (ref 4.8–5.6)
Mean Plasma Glucose: 122.63 mg/dL

## 2021-03-15 LAB — GLUCOSE, CAPILLARY: Glucose-Capillary: 97 mg/dL (ref 70–99)

## 2021-04-04 NOTE — Anesthesia Preprocedure Evaluation (Addendum)
Anesthesia Evaluation  Patient identified by MRN, date of birth, ID band Patient awake    Reviewed: Allergy & Precautions, NPO status , Patient's Chart, lab work & pertinent test results  History of Anesthesia Complications Negative for: history of anesthetic complications  Airway Mallampati: III  TM Distance: >3 FB Neck ROM: Full    Dental  (+) Teeth Intact, Dental Advisory Given   Pulmonary neg pulmonary ROS, former smoker,    Pulmonary exam normal        Cardiovascular negative cardio ROS Normal cardiovascular exam     Neuro/Psych negative neurological ROS     GI/Hepatic negative GI ROS, Neg liver ROS,   Endo/Other  diabetes, Type 2, Oral Hypoglycemic AgentsMorbid obesityPCOS  Renal/GU negative Renal ROS  negative genitourinary   Musculoskeletal negative musculoskeletal ROS (+)   Abdominal   Peds  Hematology negative hematology ROS (+)   Anesthesia Other Findings   Reproductive/Obstetrics                            Anesthesia Physical Anesthesia Plan  ASA: 3  Anesthesia Plan: General   Post-op Pain Management: Toradol IV (intra-op), Tylenol PO (pre-op) and Lidocaine infusion   Induction: Intravenous  PONV Risk Score and Plan: 3 and Ondansetron, Dexamethasone, Treatment may vary due to age or medical condition and Midazolam  Airway Management Planned: Oral ETT  Additional Equipment: None  Intra-op Plan:   Post-operative Plan: Extubation in OR  Informed Consent: I have reviewed the patients History and Physical, chart, labs and discussed the procedure including the risks, benefits and alternatives for the proposed anesthesia with the patient or authorized representative who has indicated his/her understanding and acceptance.     Dental advisory given  Plan Discussed with:   Anesthesia Plan Comments:        Anesthesia Quick Evaluation

## 2021-04-05 ENCOUNTER — Encounter (HOSPITAL_COMMUNITY): Payer: Self-pay | Admitting: General Surgery

## 2021-04-05 ENCOUNTER — Inpatient Hospital Stay (HOSPITAL_COMMUNITY)
Admission: RE | Admit: 2021-04-05 | Discharge: 2021-04-06 | DRG: 621 | Disposition: A | Discharge status: Home / Self Care | Payer: Medicare HMO | Source: Ambulatory Visit | Attending: General Surgery | Admitting: General Surgery

## 2021-04-05 ENCOUNTER — Encounter (HOSPITAL_COMMUNITY)
Admission: RE | Disposition: A | Discharge status: Home / Self Care | Payer: Self-pay | Source: Ambulatory Visit | Attending: General Surgery

## 2021-04-05 ENCOUNTER — Inpatient Hospital Stay (HOSPITAL_COMMUNITY): Payer: Medicare HMO | Admitting: Anesthesiology

## 2021-04-05 DIAGNOSIS — Z880 Allergy status to penicillin: Secondary | ICD-10-CM | POA: Diagnosis not present

## 2021-04-05 DIAGNOSIS — Z8249 Family history of ischemic heart disease and other diseases of the circulatory system: Secondary | ICD-10-CM | POA: Diagnosis not present

## 2021-04-05 DIAGNOSIS — Z833 Family history of diabetes mellitus: Secondary | ICD-10-CM

## 2021-04-05 DIAGNOSIS — E119 Type 2 diabetes mellitus without complications: Secondary | ICD-10-CM | POA: Diagnosis present

## 2021-04-05 DIAGNOSIS — Z91048 Other nonmedicinal substance allergy status: Secondary | ICD-10-CM | POA: Diagnosis not present

## 2021-04-05 DIAGNOSIS — Z87892 Personal history of anaphylaxis: Secondary | ICD-10-CM | POA: Diagnosis not present

## 2021-04-05 DIAGNOSIS — Z7984 Long term (current) use of oral hypoglycemic drugs: Secondary | ICD-10-CM | POA: Diagnosis not present

## 2021-04-05 DIAGNOSIS — Z87891 Personal history of nicotine dependence: Secondary | ICD-10-CM

## 2021-04-05 DIAGNOSIS — E559 Vitamin D deficiency, unspecified: Secondary | ICD-10-CM | POA: Diagnosis not present

## 2021-04-05 DIAGNOSIS — E785 Hyperlipidemia, unspecified: Secondary | ICD-10-CM | POA: Diagnosis not present

## 2021-04-05 DIAGNOSIS — Z6841 Body Mass Index (BMI) 40.0 and over, adult: Secondary | ICD-10-CM | POA: Diagnosis not present

## 2021-04-05 DIAGNOSIS — I10 Essential (primary) hypertension: Secondary | ICD-10-CM | POA: Diagnosis not present

## 2021-04-05 DIAGNOSIS — Z881 Allergy status to other antibiotic agents status: Secondary | ICD-10-CM | POA: Diagnosis not present

## 2021-04-05 HISTORY — PX: GASTRIC ROUX-EN-Y: SHX5262

## 2021-04-05 LAB — GLUCOSE, CAPILLARY
Glucose-Capillary: 112 mg/dL — ABNORMAL HIGH (ref 70–99)
Glucose-Capillary: 133 mg/dL — ABNORMAL HIGH (ref 70–99)
Glucose-Capillary: 141 mg/dL — ABNORMAL HIGH (ref 70–99)
Glucose-Capillary: 144 mg/dL — ABNORMAL HIGH (ref 70–99)
Glucose-Capillary: 165 mg/dL — ABNORMAL HIGH (ref 70–99)
Glucose-Capillary: 173 mg/dL — ABNORMAL HIGH (ref 70–99)

## 2021-04-05 LAB — PREGNANCY, URINE: Preg Test, Ur: NEGATIVE

## 2021-04-05 LAB — HEMOGLOBIN AND HEMATOCRIT, BLOOD
HCT: 42.9 % (ref 36.0–46.0)
Hemoglobin: 13.5 g/dL (ref 12.0–15.0)

## 2021-04-05 SURGERY — LAPAROSCOPIC ROUX-EN-Y GASTRIC BYPASS WITH UPPER ENDOSCOPY
Anesthesia: General | Site: Abdomen

## 2021-04-05 MED ORDER — ACETAMINOPHEN 500 MG PO TABS
1000.0000 mg | ORAL_TABLET | Freq: Three times a day (TID) | ORAL | Status: DC
  Administered 2021-04-05 – 2021-04-06 (×3): 1000 mg via ORAL
  Filled 2021-04-05 (×3): qty 2

## 2021-04-05 MED ORDER — ONDANSETRON HCL 4 MG/2ML IJ SOLN
INTRAMUSCULAR | Status: DC | PRN
  Administered 2021-04-05: 4 mg via INTRAVENOUS

## 2021-04-05 MED ORDER — ORAL CARE MOUTH RINSE
15.0000 mL | Freq: Once | OROMUCOSAL | Status: AC
  Administered 2021-04-05: 15 mL via OROMUCOSAL

## 2021-04-05 MED ORDER — SODIUM CHLORIDE 0.9 % IV SOLN
2.0000 g | INTRAVENOUS | Status: AC
  Administered 2021-04-05: 2 g via INTRAVENOUS
  Filled 2021-04-05: qty 2

## 2021-04-05 MED ORDER — SCOPOLAMINE 1 MG/3DAYS TD PT72
1.0000 | MEDICATED_PATCH | TRANSDERMAL | Status: DC
  Administered 2021-04-05: 1.5 mg via TRANSDERMAL
  Filled 2021-04-05: qty 1

## 2021-04-05 MED ORDER — ACETAMINOPHEN 160 MG/5ML PO SOLN
1000.0000 mg | Freq: Three times a day (TID) | ORAL | Status: DC
  Filled 2021-04-05: qty 40.6

## 2021-04-05 MED ORDER — MORPHINE SULFATE (PF) 2 MG/ML IV SOLN: 1.0000 mg | INTRAVENOUS | Status: DC | PRN

## 2021-04-05 MED ORDER — OXYCODONE HCL 5 MG PO TABS: 5.0000 mg | ORAL_TABLET | Freq: Once | ORAL | Status: DC | PRN

## 2021-04-05 MED ORDER — CHLORHEXIDINE GLUCONATE CLOTH 2 % EX PADS: 6.0000 | MEDICATED_PAD | Freq: Once | CUTANEOUS | Status: DC

## 2021-04-05 MED ORDER — ACETAMINOPHEN 500 MG PO TABS: 1000.0000 mg | ORAL_TABLET | ORAL | Status: DC

## 2021-04-05 MED ORDER — LIDOCAINE HCL (PF) 2 % IJ SOLN
INTRAMUSCULAR | Status: AC
  Filled 2021-04-05: qty 5

## 2021-04-05 MED ORDER — SIMETHICONE 80 MG PO CHEW: 80.0000 mg | CHEWABLE_TABLET | Freq: Four times a day (QID) | ORAL | Status: DC | PRN

## 2021-04-05 MED ORDER — 0.9 % SODIUM CHLORIDE (POUR BTL) OPTIME
TOPICAL | Status: DC | PRN
  Administered 2021-04-05: 1000 mL

## 2021-04-05 MED ORDER — SUGAMMADEX SODIUM 200 MG/2ML IV SOLN
INTRAVENOUS | Status: DC | PRN
  Administered 2021-04-05: 400 mg via INTRAVENOUS

## 2021-04-05 MED ORDER — ENSURE MAX PROTEIN PO LIQD
2.0000 [oz_av] | ORAL | Status: DC
  Administered 2021-04-06 (×5): 2 [oz_av] via ORAL

## 2021-04-05 MED ORDER — LACTATED RINGERS IV SOLN: INTRAVENOUS | Status: DC

## 2021-04-05 MED ORDER — BUPIVACAINE LIPOSOME 1.3 % IJ SUSP
INTRAMUSCULAR | Status: DC | PRN
  Administered 2021-04-05: 20 mL

## 2021-04-05 MED ORDER — INSULIN ASPART 100 UNIT/ML IJ SOLN
0.0000 [IU] | INTRAMUSCULAR | Status: DC
  Administered 2021-04-05: 2 [IU] via SUBCUTANEOUS
  Administered 2021-04-05 (×2): 3 [IU] via SUBCUTANEOUS
  Administered 2021-04-06 (×2): 2 [IU] via SUBCUTANEOUS

## 2021-04-05 MED ORDER — BUPIVACAINE-EPINEPHRINE (PF) 0.25% -1:200000 IJ SOLN
INTRAMUSCULAR | Status: AC
  Filled 2021-04-05: qty 30

## 2021-04-05 MED ORDER — ONDANSETRON HCL 4 MG/2ML IJ SOLN: 4.0000 mg | INTRAMUSCULAR | Status: DC | PRN

## 2021-04-05 MED ORDER — FENTANYL CITRATE (PF) 100 MCG/2ML IJ SOLN
INTRAMUSCULAR | Status: AC
  Filled 2021-04-05: qty 2

## 2021-04-05 MED ORDER — BUPIVACAINE HCL 0.25 % IJ SOLN
INTRAMUSCULAR | Status: DC | PRN
  Administered 2021-04-05: 20 mL

## 2021-04-05 MED ORDER — ROCURONIUM BROMIDE 10 MG/ML (PF) SYRINGE
PREFILLED_SYRINGE | INTRAVENOUS | Status: AC
  Filled 2021-04-05: qty 10

## 2021-04-05 MED ORDER — FAMOTIDINE IN NACL 20-0.9 MG/50ML-% IV SOLN
20.0000 mg | Freq: Two times a day (BID) | INTRAVENOUS | Status: DC
  Administered 2021-04-05 – 2021-04-06 (×3): 20 mg via INTRAVENOUS
  Filled 2021-04-05 (×3): qty 50

## 2021-04-05 MED ORDER — CHLORHEXIDINE GLUCONATE 0.12 % MT SOLN: 15.0000 mL | Freq: Once | OROMUCOSAL | Status: AC

## 2021-04-05 MED ORDER — ROCURONIUM BROMIDE 10 MG/ML (PF) SYRINGE
PREFILLED_SYRINGE | INTRAVENOUS | Status: DC | PRN
  Administered 2021-04-05: 40 mg via INTRAVENOUS
  Administered 2021-04-05: 100 mg via INTRAVENOUS

## 2021-04-05 MED ORDER — DEXAMETHASONE SODIUM PHOSPHATE 10 MG/ML IJ SOLN
INTRAMUSCULAR | Status: AC
  Filled 2021-04-05: qty 1

## 2021-04-05 MED ORDER — HEPARIN SODIUM (PORCINE) 5000 UNIT/ML IJ SOLN
5000.0000 [IU] | INTRAMUSCULAR | Status: AC
  Administered 2021-04-05: 5000 [IU] via SUBCUTANEOUS
  Filled 2021-04-05: qty 1

## 2021-04-05 MED ORDER — OXYCODONE HCL 5 MG/5ML PO SOLN: 5.0000 mg | Freq: Once | ORAL | Status: DC | PRN

## 2021-04-05 MED ORDER — ONDANSETRON HCL 4 MG/2ML IJ SOLN
4.0000 mg | Freq: Once | INTRAMUSCULAR | Status: AC | PRN
  Administered 2021-04-05: 4 mg via INTRAVENOUS

## 2021-04-05 MED ORDER — OXYCODONE HCL 5 MG/5ML PO SOLN
5.0000 mg | Freq: Four times a day (QID) | ORAL | Status: DC | PRN
  Administered 2021-04-05 – 2021-04-06 (×4): 5 mg via ORAL
  Filled 2021-04-05 (×4): qty 5

## 2021-04-05 MED ORDER — LIDOCAINE HCL (PF) 2 % IJ SOLN
INTRAMUSCULAR | Status: DC | PRN
  Administered 2021-04-05: 1.5 mg/kg/h via INTRADERMAL

## 2021-04-05 MED ORDER — GABAPENTIN 300 MG PO CAPS
300.0000 mg | ORAL_CAPSULE | ORAL | Status: AC
  Administered 2021-04-05: 300 mg via ORAL
  Filled 2021-04-05: qty 1

## 2021-04-05 MED ORDER — ONDANSETRON HCL 4 MG/2ML IJ SOLN
INTRAMUSCULAR | Status: AC
  Filled 2021-04-05: qty 2

## 2021-04-05 MED ORDER — BUPIVACAINE LIPOSOME 1.3 % IJ SUSP
INTRAMUSCULAR | Status: AC
  Filled 2021-04-05: qty 20

## 2021-04-05 MED ORDER — ESMOLOL HCL 100 MG/10ML IV SOLN
INTRAVENOUS | Status: DC | PRN
  Administered 2021-04-05 (×2): 20 mg via INTRAVENOUS

## 2021-04-05 MED ORDER — LACTATED RINGERS IR SOLN
Status: DC | PRN
  Administered 2021-04-05: 1000 mL

## 2021-04-05 MED ORDER — ACETAMINOPHEN 500 MG PO TABS
1000.0000 mg | ORAL_TABLET | Freq: Once | ORAL | Status: AC
  Administered 2021-04-05: 1000 mg via ORAL
  Filled 2021-04-05: qty 2

## 2021-04-05 MED ORDER — LIDOCAINE HCL (CARDIAC) PF 100 MG/5ML IV SOSY
PREFILLED_SYRINGE | INTRAVENOUS | Status: DC | PRN
  Administered 2021-04-05: 100 mg via INTRAVENOUS

## 2021-04-05 MED ORDER — PROPOFOL 10 MG/ML IV BOLUS
INTRAVENOUS | Status: DC | PRN
  Administered 2021-04-05: 200 mg via INTRAVENOUS

## 2021-04-05 MED ORDER — AMISULPRIDE (ANTIEMETIC) 5 MG/2ML IV SOLN: 10.0000 mg | Freq: Once | INTRAVENOUS | Status: DC | PRN

## 2021-04-05 MED ORDER — APREPITANT 40 MG PO CAPS
40.0000 mg | ORAL_CAPSULE | ORAL | Status: AC
  Administered 2021-04-05: 40 mg via ORAL
  Filled 2021-04-05: qty 1

## 2021-04-05 MED ORDER — LIDOCAINE HCL (PF) 2 % IJ SOLN
INTRAMUSCULAR | Status: AC
  Filled 2021-04-05: qty 15

## 2021-04-05 MED ORDER — BUPIVACAINE LIPOSOME 1.3 % IJ SUSP: 20.0000 mL | Freq: Once | INTRAMUSCULAR | Status: DC

## 2021-04-05 MED ORDER — MIDAZOLAM HCL 5 MG/5ML IJ SOLN
INTRAMUSCULAR | Status: DC | PRN
  Administered 2021-04-05: 2 mg via INTRAVENOUS

## 2021-04-05 MED ORDER — HYDRALAZINE HCL 25 MG PO TABS: 25.0000 mg | ORAL_TABLET | Freq: Three times a day (TID) | ORAL | Status: DC | PRN

## 2021-04-05 MED ORDER — BUPIVACAINE HCL (PF) 0.25 % IJ SOLN
INTRAMUSCULAR | Status: AC
  Filled 2021-04-05: qty 30

## 2021-04-05 MED ORDER — DEXTROSE-NACL 5-0.45 % IV SOLN: INTRAVENOUS | Status: DC

## 2021-04-05 MED ORDER — FENTANYL CITRATE (PF) 100 MCG/2ML IJ SOLN
INTRAMUSCULAR | Status: DC | PRN
  Administered 2021-04-05: 100 ug via INTRAVENOUS
  Administered 2021-04-05 (×2): 50 ug via INTRAVENOUS

## 2021-04-05 MED ORDER — ENOXAPARIN SODIUM 30 MG/0.3ML IJ SOSY
30.0000 mg | PREFILLED_SYRINGE | Freq: Two times a day (BID) | INTRAMUSCULAR | Status: DC
  Administered 2021-04-05 – 2021-04-06 (×2): 30 mg via SUBCUTANEOUS
  Filled 2021-04-05 (×2): qty 0.3

## 2021-04-05 MED ORDER — FENTANYL CITRATE PF 50 MCG/ML IJ SOSY: 25.0000 ug | PREFILLED_SYRINGE | INTRAMUSCULAR | Status: DC | PRN

## 2021-04-05 MED ORDER — MIDAZOLAM HCL 2 MG/2ML IJ SOLN
INTRAMUSCULAR | Status: AC
  Filled 2021-04-05: qty 2

## 2021-04-05 MED ORDER — KETOROLAC TROMETHAMINE 30 MG/ML IJ SOLN
INTRAMUSCULAR | Status: DC | PRN
  Administered 2021-04-05: 30 mg via INTRAVENOUS

## 2021-04-05 MED ORDER — DEXAMETHASONE SODIUM PHOSPHATE 4 MG/ML IJ SOLN
4.0000 mg | INTRAMUSCULAR | Status: AC
  Administered 2021-04-05: 4 mg via INTRAVENOUS

## 2021-04-05 SURGICAL SUPPLY — 76 items
APL PRP STRL LF DISP 70% ISPRP (MISCELLANEOUS) ×1
APL SKNCLS STERI-STRIP NONHPOA (GAUZE/BANDAGES/DRESSINGS)
APPLIER CLIP 5 13 M/L LIGAMAX5 (MISCELLANEOUS)
APPLIER CLIP ROT 10 11.4 M/L (STAPLE)
APPLIER CLIP ROT 13.4 12 LRG (CLIP)
APR CLP LRG 13.4X12 ROT 20 MLT (CLIP)
APR CLP MED LRG 11.4X10 (STAPLE)
APR CLP MED LRG 5 ANG JAW (MISCELLANEOUS)
BAG COUNTER SPONGE SURGICOUNT (BAG) ×2 IMPLANT
BAG SPNG CNTER NS LX DISP (BAG) ×1
BENZOIN TINCTURE PRP APPL 2/3 (GAUZE/BANDAGES/DRESSINGS) IMPLANT
BLADE SURG SZ11 CARB STEEL (BLADE) ×2 IMPLANT
BNDG ADH 1X3 SHEER STRL LF (GAUZE/BANDAGES/DRESSINGS) IMPLANT
BNDG ADH THN 3X1 STRL LF (GAUZE/BANDAGES/DRESSINGS)
CABLE HIGH FREQUENCY MONO STRZ (ELECTRODE) IMPLANT
CHLORAPREP W/TINT 26 (MISCELLANEOUS) ×2 IMPLANT
CLIP APPLIE 5 13 M/L LIGAMAX5 (MISCELLANEOUS) IMPLANT
CLIP APPLIE ROT 10 11.4 M/L (STAPLE) IMPLANT
CLIP APPLIE ROT 13.4 12 LRG (CLIP) IMPLANT
COVER SURGICAL LIGHT HANDLE (MISCELLANEOUS) ×2 IMPLANT
DEVICE SUTURE ENDOST 10MM (ENDOMECHANICALS) ×2 IMPLANT
DRAIN CHANNEL 19F RND (DRAIN) IMPLANT
DRAIN PENROSE 0.25X18 (DRAIN) ×2 IMPLANT
ELECT L-HOOK LAP 45CM DISP (ELECTROSURGICAL) ×2
ELECTRODE L-HOOK LAP 45CM DISP (ELECTROSURGICAL) ×1 IMPLANT
EVACUATOR SILICONE 100CC (DRAIN) IMPLANT
GAUZE 4X4 16PLY ~~LOC~~+RFID DBL (SPONGE) ×2 IMPLANT
GAUZE SPONGE 4X4 12PLY STRL (GAUZE/BANDAGES/DRESSINGS) IMPLANT
GLOVE SURG POLYISO LF SZ7 (GLOVE) ×2 IMPLANT
GLOVE SURG UNDER POLY LF SZ7 (GLOVE) ×2 IMPLANT
GOWN STRL REUS W/TWL LRG LVL3 (GOWN DISPOSABLE) ×2 IMPLANT
GOWN STRL REUS W/TWL XL LVL3 (GOWN DISPOSABLE) ×6 IMPLANT
GRASPER SUT TROCAR 14GX15 (MISCELLANEOUS) IMPLANT
HANDLE STAPLE EGIA 4 XL (STAPLE) ×2 IMPLANT
IRRIG SUCT STRYKERFLOW 2 WTIP (MISCELLANEOUS) ×2
IRRIGATION SUCT STRKRFLW 2 WTP (MISCELLANEOUS) ×1 IMPLANT
KIT BASIN OR (CUSTOM PROCEDURE TRAY) ×2 IMPLANT
KIT GASTRIC LAVAGE 34FR ADT (SET/KITS/TRAYS/PACK) IMPLANT
MARKER SKIN DUAL TIP RULER LAB (MISCELLANEOUS) ×2 IMPLANT
MAT PREVALON FULL STRYKER (MISCELLANEOUS) ×2 IMPLANT
NDL SPNL 22GX3.5 QUINCKE BK (NEEDLE) ×1 IMPLANT
NEEDLE SPNL 22GX3.5 QUINCKE BK (NEEDLE) ×2 IMPLANT
PACK CARDIOVASCULAR III (CUSTOM PROCEDURE TRAY) ×2 IMPLANT
PENCIL SMOKE EVACUATOR (MISCELLANEOUS) IMPLANT
RELOAD EGIA 45 MED/THCK PURPLE (STAPLE) ×1 IMPLANT
RELOAD EGIA 45 TAN VASC (STAPLE) IMPLANT
RELOAD EGIA 60 MED/THCK PURPLE (STAPLE) ×10 IMPLANT
RELOAD EGIA 60 TAN VASC (STAPLE) ×6 IMPLANT
RELOAD ENDO STITCH 2.0 (ENDOMECHANICALS) ×22
RELOAD STAPLE 60 MED/THCK ART (STAPLE) ×4 IMPLANT
RELOAD SUT SNGL STCH ABSRB 2-0 (ENDOMECHANICALS) ×5 IMPLANT
RELOAD SUT SNGL STCH BLK 2-0 (ENDOMECHANICALS) ×6 IMPLANT
SCISSORS LAP 5X45 EPIX DISP (ENDOMECHANICALS) ×2 IMPLANT
SET TUBE SMOKE EVAC HIGH FLOW (TUBING) ×2 IMPLANT
SHEARS HARMONIC ACE PLUS 45CM (MISCELLANEOUS) ×2 IMPLANT
SLEEVE XCEL OPT CAN 5 100 (ENDOMECHANICALS) ×6 IMPLANT
SOL ANTI FOG 6CC (MISCELLANEOUS) ×1 IMPLANT
SOLUTION ANTI FOG 6CC (MISCELLANEOUS) ×1
STAPLER VISISTAT 35W (STAPLE) IMPLANT
STRIP CLOSURE SKIN 1/2X4 (GAUZE/BANDAGES/DRESSINGS) IMPLANT
SUT ETHIBOND 0 36 GRN (SUTURE) IMPLANT
SUT ETHILON 2 0 PS N (SUTURE) IMPLANT
SUT MNCRL AB 4-0 PS2 18 (SUTURE) ×2 IMPLANT
SUT RELOAD ENDO STITCH 2 48X1 (ENDOMECHANICALS) ×5
SUT RELOAD ENDO STITCH 2.0 (ENDOMECHANICALS) ×6
SUT SILK 0 SH 30 (SUTURE) IMPLANT
SUT VICRYL 0 TIES 12 18 (SUTURE) IMPLANT
SUTURE RELOAD END STTCH 2 48X1 (ENDOMECHANICALS) ×5 IMPLANT
SUTURE RELOAD ENDO STITCH 2.0 (ENDOMECHANICALS) ×6 IMPLANT
SYR 20ML LL LF (SYRINGE) ×2 IMPLANT
SYR 50ML LL SCALE MARK (SYRINGE) ×2 IMPLANT
TOWEL OR 17X26 10 PK STRL BLUE (TOWEL DISPOSABLE) ×2 IMPLANT
TOWEL OR NON WOVEN STRL DISP B (DISPOSABLE) ×2 IMPLANT
TROCAR BLADELESS OPT 5 100 (ENDOMECHANICALS) ×2 IMPLANT
TROCAR XCEL 12X100 BLDLESS (ENDOMECHANICALS) ×2 IMPLANT
TUBING CONNECTING 10 (TUBING) ×2 IMPLANT

## 2021-04-05 NOTE — Op Note (Addendum)
Preop Diagnosis: Obesity Class III  Postop Diagnosis: same  Procedure performed: laparoscopic Roux en Y gastric bypass  Assitant: Wenda Low  Indications:  The patient is a 35 y.o. year-old morbidly obese female who has been followed in the Bariatric Clinic as an outpatient. This patient was diagnosed with morbid obesity with a BMI of Body mass index is 56.77 kg/m. and significant co-morbidities including hypertension and non-insulin dependent diabetes.  The patient was counseled extensively in the Bariatric Outpatient Clinic and after a thorough explanation of the risks and benefits of surgery (including death from complications, bowel leak, infection such as peritonitis and/or sepsis, internal hernia, bleeding, need for blood transfusion, bowel obstruction, organ failure, pulmonary embolus, deep venous thrombosis, wound infection, incisional hernia, skin breakdown, and others entailed on the consent form) and after a compliant diet and exercise program, the patient was scheduled for an elective laparoscopic gastric bypass.  Description of Operation:  Following informed consent, the patient was taken to the operating room and placed on the operating table in the supine position.  She had previously received prophylactic antibiotics and subcutaneous heparin for DVT prophylaxis in the pre-op holding area.  After induction of general endotracheal anesthesia by the anesthesiologist, the patient underwent placement of sequential compression devices, Foley catheter and an oro-gastric tube.  A timeout was confirmed by the surgery and anesthesia teams.  The patient was adequately padded at all pressure points and placed on a footboard to prevent slippage from the OR table during extremes of position during surgery.  She underwent a routine sterile prep and drape of her entire abdomen.    Next, A transverse incision was made under the left subcostal area and a 33mm optical viewing trocar was introduced into  the peritoneal cavity. Pneumoperitoneum was applied with a high flow and low pressure. A laparoscope was inserted to confirm placement. A extraperitoneal block was then placed at the lateral abdominal wall using exparel diluted with marcaine . 5 additional trocars were placed: 1 58mm trocar to the left of the midline. 1 additional 54mm trocar in the left lateral area, 1 81mm trocar in the right mid abdomen, and 1 32mm trocar in the right subcostal area.  The greater omentum was flipped over the transverse colon and under the left lobe of the liver. The ligament of trietz was identified. There was a small growth on the jejunum within 10 cm of the ligament of Trietz. There was discussion about the finding. Due to appearance and softness, it was determined to not be physiologically significant and best left alone. 40cm of jejunum was measured starting from the ligament of Trietz. The mesentery was checked to ensure mobility. Next, a 38mm 2-43mm tristapler was used to divide the jejunum at this location. The harmonic scalpel was used to divide the mesentery down to the origin. A 1/2" penrose was sutured to the distal side. 100cm of jejunum was measured starting at the division. 2-0 silk was used to appose the biliary limb to the 100cm mark of jejunum in 2 places. Enterotomies were made in the biliary and common channels and a 29mm 2-3 tristapler was used to create the J-J anastomosis. A 2-0 silk was used to appose the enterotomy edges and a 77mm 2-3 tristapler was used to close the enterotomy. An anti-obstruction 2-0 silk suture was placed. Next, the mesenteric defect was closed with a 2-0 silk in running fashion.The J-J appeared patent and in neutral position.  Next, the omentum was divided using the Harmonic scalpel. The patient  was placed in steep Reverse Trendelenberg position. A Nathanson retracted was placed through a subxiphoid incision and used to retract the liver. The fat pad over the fundus was incised to  free the fundus. Next, a position along the lesser curve 6cm from GE junction was identified. The pars flaccida was entered and the fat over the lesser curve divided to enter the lesser sac. Multiple 64mm 3-13mm tristaple firings were peformed to create a 6cm pouch. The Roux limb was identified using the placed penrose and brought up to the stomach in antecolic fashion. The limb was inspected to ensure a neutral position. A 2-0 vicryl suture was then used to create a posterior layer connecting the stomach to the Roux limb jejunum in running fashion. Next cautery was used to create an enterotomy along the medial aspect of this suture line and Harmonic scalpel used to create gastotomy. A 30mm 3-27mm tristapler was then used to create a 25-3mm anastomosis. 2 2-0 vicryl sutures were used in running fashion to close the gastrotomy. Finally, a 2-0 vicryl suture was used to close an anterior layer of stomach and jejunum over the anastomosis in running fashion. The penrose was removed from the Roux limb. A 2-0 vicryl was used to appose the transverse mesocolon to the mesentery of the Roux limb.  The assistant then went and performed an upper endoscopy and leak test. No bubbles were seen and the pouch and limb distended appropriately. The limb and pouch were deflated, the endoscope was removed. Hemostasis was ensured. Pneumoperitoneum was evacuated, all ports were removed and all incisions closed with 4-0 monocryl suture in subcuticular fashion. Steristrips and bandaids were put in place for dressing. The patient awoke from anesthesia and was brought to pacu in stable condition. All counts were correct.  Findings: small soft mass on the jejunum within 10 cm of the Ligament of Trietz  Specimens:  None  Estimated Blood Loss: 20 ml  Local Anesthesia: 50 ml Exparel: 0.5% Marcaine Mix  Post-Op Plan:       Pain Management: PO, prn      Antibiotics: Prophylactic      Anticoagulation: Prophylactic, Starting now       Post Op Studies/Consults: Not applicable      Intended Discharge: within 48h      Intended Outpatient Follow-Up: Two Week      Intended Outpatient Studies: Not Applicable      Other: Not Applicable  De Blanch Lord Lancour

## 2021-04-05 NOTE — Transfer of Care (Signed)
Immediate Anesthesia Transfer of Care Note  Patient: [name]  Procedure(s) Performed: LAPAROSCOPIC ROUX-EN-Y GASTRIC BYPASS WITH UPPER ENDOSCOPY (Abdomen)  Patient Location: PACU  Anesthesia Type:General  Level of Consciousness: drowsy  Airway & Oxygen Therapy: Patient Spontanous Breathing and Patient connected to face mask oxygen  Post-op Assessment: Report given to RN and Post -op Vital signs reviewed and stable  Post vital signs: Reviewed and stable  Last Vitals:  Vitals Value Taken Time  BP 164/102 04/05/21 0950  Temp 36.6 C 04/05/21 0950  Pulse 86 04/05/21 0951  Resp 25 04/05/21 0951  SpO2 100 % 04/05/21 0951  Vitals shown include unvalidated device data.  Last Pain:  Vitals:   04/05/21 0534  TempSrc: Oral         Complications: No notable events documented.

## 2021-04-05 NOTE — Anesthesia Postprocedure Evaluation (Signed)
Anesthesia Post Note  Patient: [name]  Procedure(s) Performed: LAPAROSCOPIC ROUX-EN-Y GASTRIC BYPASS WITH UPPER ENDOSCOPY (Abdomen)     Patient location during evaluation: PACU Anesthesia Type: General Level of consciousness: awake and alert Pain management: pain level controlled Vital Signs Assessment: post-procedure vital signs reviewed and stable Respiratory status: spontaneous breathing, nonlabored ventilation and respiratory function stable Cardiovascular status: blood pressure returned to baseline and stable Postop Assessment: no apparent nausea or vomiting Anesthetic complications: no   No notable events documented.  Last Vitals:  Vitals:   04/05/21 1045 04/05/21 1102  BP: (!) 158/105 (!) 173/106  Pulse: 84 84  Resp: 17 16  Temp: 36.6 C 36.7 C  SpO2: 100% 100%    Last Pain:  Vitals:   04/05/21 1102  TempSrc: Oral  PainSc:                  Lucretia Kern
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
## 2021-04-05 NOTE — Op Note (Signed)
[name] [id number] 1985/11/09 04/05/2021  Preoperative diagnosis: roux Y gastric bypass in progress  Postoperative diagnosis: Same   Procedure: Upper endoscopy   Surgeon: Susy Frizzle B. Daphine Deutscher  M.D., FACS   Anesthesia: Gen.   Indications for procedure: This patient was undergoing a roux Y by Dr. Sheliah Hatch.    Description of procedure: The endoscopy was placed in the mouth and into the oropharynx and under endoscopic vision it was advanced to the esophagogastric junction.  The pouch was insufflated and it was appropriate size and had a patent gastrojejuostomy.   No bleeding or leaks were detected.  The scope was withdrawn without difficulty.     Matt B. Daphine Deutscher, MD, FACS General, Bariatric, & Minimally Invasive Surgery [facility], [place]

## 2021-04-05 NOTE — Progress Notes (Signed)

## 2021-04-05 NOTE — Anesthesia Procedure Notes (Signed)
Procedure Name: Intubation Date/Time: 04/05/2021 7:35 AM Performed by: Caren Macadam, CRNA Pre-anesthesia Checklist: Patient identified, Emergency Drugs available, Suction available and Patient being monitored Patient Re-evaluated:Patient Re-evaluated prior to induction Oxygen Delivery Method: Circle system utilized Preoxygenation: Pre-oxygenation with 100% oxygen Induction Type: IV induction Ventilation: Mask ventilation without difficulty Laryngoscope Size: 2 and Miller Grade View: Grade II Tube type: Oral Tube size: 7.0 mm Number of attempts: 1 Airway Equipment and Method: Stylet Placement Confirmation: ETT inserted through vocal cords under direct vision, positive ETCO2 and breath sounds checked- equal and bilateral Secured at: 24 cm Tube secured with: Tape Dental Injury: Teeth and Oropharynx as per pre-operative assessment

## 2021-04-05 NOTE — H&P (Signed)
Chief Complaint: weight loss   History of Present Illness: [name] is a 35 y.o. female who is seen today for preop conversation.  She has completed all requirements. She has started the preop diet and lost 8 pounds. She has no new medical issues or medications  Review of Systems: A complete review of systems was obtained from the patient. I have reviewed this information and discussed as appropriate with the patient. See HPI as well for other ROS.  Review of Systems  Constitutional: Negative.  HENT: Negative.  Eyes: Negative.  Respiratory: Negative.  Cardiovascular: Negative.  Gastrointestinal: Negative.  Genitourinary: Negative.  Musculoskeletal: Negative.  Skin: Negative.  Neurological: Negative.  Endo/Heme/Allergies: Negative.  Psychiatric/Behavioral: Negative.    Medical History: Past Medical History:  Diagnosis Date   Anemia   Anxiety   Diabetes mellitus without complication (CMS-HCC)   There is no problem list on file for this patient.  Past Surgical History:  Procedure Laterality Date   TONSILLECTOMY   wisdom teeth removed    Allergies  Allergen Reactions   Venom-Honey Bee Anaphylaxis and Swelling   Sulfamethoxazole-Trimethoprim Other (See Comments)  Vomit and loose stools   Other Itching, Other (See Comments) and Rash  Cannot tolerate any med that end in -[place]; also develops listers on her scalp   Penicillins Rash  Has patient had a PCN reaction causing immediate rash, facial/tongue/throat swelling, SOB or lightheadedness with hypotension: yes Has patient had a PCN reaction causing severe rash involving mucus membranes or skin necrosis: Unknown Has patient had a PCN reaction that required hospitalization: Unknown Has patient had a PCN reaction occurring within the last 10 years: yes If all of the above answers are "NO", then may proceed with Cephalosporin use. Tolerated Cefepime and Rocephin in the past.   Vancomycin Itching and Rash    Current Outpatient Medications on File Prior to Visit  Medication Sig Dispense Refill   metFORMIN (GLUCOPHAGE) 500 MG tablet Take 500 mg by mouth daily with breakfast   No current facility-administered medications on file prior to visit.   Family History  Problem Relation Age of Onset   Obesity Mother   High blood pressure (Hypertension) Mother   Obesity Father   Diabetes Father   Obesity Sister   High blood pressure (Hypertension) Sister   Obesity Brother   High blood pressure (Hypertension) Brother    Social History   Tobacco Use  Smoking Status Former Smoker  Smokeless Tobacco Former Neurosurgeon    Social History   Socioeconomic History   Marital status: Unknown  Tobacco Use   Smoking status: Former Smoker   Smokeless tobacco: Former Forensic psychologist Use: Never used  Substance and Sexual Activity   Alcohol use: Never   Drug use: Never   Objective:   Vitals:  02/05/21 0918  BP: 118/84  Pulse: 106  Temp: 36.7 C (98.1 F)  SpO2: 96%  Weight: (!) 174.2 kg (384 lb)  Height: 175.3 cm (5\' 9" )   Body mass index is 56.71 kg/m.  Physical Exam Constitutional:  Appearance: Normal appearance.  HENT:  Head: Normocephalic and atraumatic.  Pulmonary:  Effort: Pulmonary effort is normal.  Musculoskeletal:  General: Normal range of motion.  Cervical back: Normal range of motion.  Neurological:  General: No focal deficit present.  Mental Status: She is alert and oriented to person, place, and time. Mental status is at baseline.  Psychiatric:  Mood and Affect: Mood normal.  Behavior: Behavior normal.  Thought Content:  Thought content normal.     Labs, Imaging and Diagnostic Testing:  I reviewed dietician notes  Assessment and Plan:  Diagnoses and all orders for this visit:  Morbid (severe) obesity due to excess calories (CMS-HCC)  Type 2 diabetes mellitus without complication, with long-term current use of insulin (CMS-HCC)  The patient meets  weight loss surgery criteria. Due to the above reasons, I think laparoscopic RNY gastric bypass is the best option for the patient.   We discussed RNY gastric bypass. We discussed the preoperative, operative and postoperative process. I explained the surgery in detail including the performance of an EGD near the end of the surgery to test for leak. We discussed the typical hospital course including a 1-2 day stay baring any complications. The patient was given Agricultural engineer. We did discuss the possibility of weight regain several years after the procedure.  The risks of infection, bleeding, pain, scarring, weight regain, too little or too much weight loss, vitamin deficiencies and need for lifelong vitamin supplementation, hair loss, need for protein supplementation, leaks, stricture, reflux, food intolerance, gallstone formation, hernia, need for reoperation, need for open surgery, injury to spleen or surrounding structures, DVT's, PE, and death again discussed with the patient and the patient expressed understanding and desires to proceed with laparoscopic Roux en Y gastric bypass, possible open, intraoperative endoscopy.  We discussed that before and after surgery that there would be an alteration in their diet. I explained that we may put them on a diet 2 weeks before surgery. I also explained that they would be on a liquid diet for 2 weeks after surgery. We discussed that they would have to avoid certain foods after surgery. We discussed the importance of physical activity as well as compliance with our dietary and supplement recommendations and routine follow-up.

## 2021-04-06 ENCOUNTER — Other Ambulatory Visit: Payer: Self-pay

## 2021-04-06 ENCOUNTER — Encounter (HOSPITAL_COMMUNITY): Payer: Self-pay | Admitting: General Surgery

## 2021-04-06 LAB — CBC WITH DIFFERENTIAL/PLATELET
Abs Immature Granulocytes: 0.05 10*3/uL (ref 0.00–0.07)
Basophils Absolute: 0 10*3/uL (ref 0.0–0.1)
Basophils Relative: 0 %
Eosinophils Absolute: 0 10*3/uL (ref 0.0–0.5)
Eosinophils Relative: 0 %
HCT: 39.4 % (ref 36.0–46.0)
Hemoglobin: 12.2 g/dL (ref 12.0–15.0)
Immature Granulocytes: 0 %
Lymphocytes Relative: 21 %
Lymphs Abs: 2.5 10*3/uL (ref 0.7–4.0)
MCH: 25.8 pg — ABNORMAL LOW (ref 26.0–34.0)
MCHC: 31 g/dL (ref 30.0–36.0)
MCV: 83.3 fL (ref 80.0–100.0)
Monocytes Absolute: 0.8 10*3/uL (ref 0.1–1.0)
Monocytes Relative: 7 %
Neutro Abs: 8.6 10*3/uL — ABNORMAL HIGH (ref 1.7–7.7)
Neutrophils Relative %: 72 %
Platelets: 258 10*3/uL (ref 150–400)
RBC: 4.73 MIL/uL (ref 3.87–5.11)
RDW: 15.3 % (ref 11.5–15.5)
WBC: 11.9 10*3/uL — ABNORMAL HIGH (ref 4.0–10.5)
nRBC: 0 % (ref 0.0–0.2)

## 2021-04-06 LAB — GLUCOSE, CAPILLARY
Glucose-Capillary: 123 mg/dL — ABNORMAL HIGH (ref 70–99)
Glucose-Capillary: 123 mg/dL — ABNORMAL HIGH (ref 70–99)
Glucose-Capillary: 85 mg/dL (ref 70–99)

## 2021-04-06 LAB — COMPREHENSIVE METABOLIC PANEL
ALT: 20 U/L (ref 0–44)
AST: 18 U/L (ref 15–41)
Albumin: 3.5 g/dL (ref 3.5–5.0)
Alkaline Phosphatase: 65 U/L (ref 38–126)
Anion gap: 7 (ref 5–15)
BUN: 10 mg/dL (ref 6–20)
CO2: 27 mmol/L (ref 22–32)
Calcium: 8.5 mg/dL — ABNORMAL LOW (ref 8.9–10.3)
Chloride: 102 mmol/L (ref 98–111)
Creatinine, Ser: 0.86 mg/dL (ref 0.44–1.00)
GFR, Estimated: >60 mL/min (ref >60)
Glucose, Bld: 136 mg/dL — ABNORMAL HIGH (ref 70–99)
Potassium: 4.2 mmol/L (ref 3.5–5.1)
Sodium: 136 mmol/L (ref 135–145)
Total Bilirubin: 0.4 mg/dL (ref 0.3–1.2)
Total Protein: 7.2 g/dL (ref 6.5–8.1)

## 2021-04-06 SURGERY — Surgical Case
Anesthesia: *Unknown

## 2021-04-06 MED ORDER — OXYCODONE HCL 5 MG PO TABS: 5.0000 mg | ORAL_TABLET | Freq: Four times a day (QID) | ORAL | 0 refills | Status: DC | PRN

## 2021-04-06 MED ORDER — ONDANSETRON 4 MG PO TBDP: 4.0000 mg | ORAL_TABLET | Freq: Four times a day (QID) | ORAL | 0 refills | Status: DC | PRN

## 2021-04-06 MED ORDER — PANTOPRAZOLE SODIUM 40 MG PO TBEC: 40.0000 mg | DELAYED_RELEASE_TABLET | Freq: Every day | ORAL | 0 refills | Status: DC

## 2021-04-06 MED ORDER — ACETAMINOPHEN 500 MG PO TABS: 1000.0000 mg | ORAL_TABLET | Freq: Three times a day (TID) | ORAL | 0 refills | Status: AC

## 2021-04-06 NOTE — Progress Notes (Signed)
Reviewed written d/c instructions w pt and her mother and all questions answered. They both verbalized understanding. D/C ambulatory w all belongings in stable condition.

## 2021-04-06 NOTE — Discharge Summary (Signed)
Physician Discharge Summary  [name] OIB:[id number] DOB: 01-Sep-1985 DOA: 04/05/2021  PCP: Ailene Ards, NP  Admit date: 04/05/2021 Discharge date: 04/06/2021  Recommendations for Outpatient Follow-up:   (include homehealth, outpatient follow-up instructions, specific recommendations for PCP to follow-up on, etc.)   Follow-up Information     [name], [name], MD. Go on 04/28/2021.   Specialty: General Surgery Why: at 11:20am.  Please arrive 15 minutes prior to your appointment time.  Thank you. Contact information: [place] [address] [phone]         Surgery, [place]. Go on 05/27/2021.   Specialty: General Surgery Why: at 1:30pm with Dr. Kieth Brightly.  Please arrive 15 minutes prior to your appointment time.  Thank you. Contact information: [place] [address] [phone]                Discharge Diagnoses:  Principal Problem:   Morbid (severe) obesity due to excess calories [facility])   Surgical Procedure: Roux-en-Y gastric bypass, upper endoscopy  Discharge Condition: Good Disposition: Home  Diet recommendation: Postoperative sleeve gastrectomy diet (liquids only)  Filed Weights   04/05/21 0534  Weight: (!) 174.4 kg     Hospital Course:  The patient was admitted after undergoing Roux-en-Y gastric bypass. POD 0 she ambulated well. POD 1 she was started on the water diet protocol and tolerated 540 ml in the first shift. Once meeting the water amount she was advanced to bariatric protein shakes which they tolerated and were discharged home POD 1.  Treatments: surgery: Roux-en-Y gastric bypass  Discharge Instructions  Discharge Instructions     Ambulate hourly while awake   Complete by: As directed    Call MD for:  difficulty breathing, headache or visual disturbances   Complete by: As directed    Call MD for:  persistant dizziness or light-headedness   Complete by: As  directed    Call MD for:  persistant nausea and vomiting   Complete by: As directed    Call MD for:  redness, tenderness, or signs of infection (pain, swelling, redness, odor or green/yellow discharge around incision site)   Complete by: As directed    Call MD for:  severe uncontrolled pain   Complete by: As directed    Call MD for:  temperature >101 F   Complete by: As directed    Diet bariatric full liquid   Complete by: As directed    Discharge wound care:   Complete by: As directed    Remove Bandaids tomorrow, ok to shower tomorrow. Steristrips may fall off in 1-3 weeks.   Incentive spirometry   Complete by: As directed    Perform hourly while awake      Allergies as of 04/06/2021       Reactions   Bee Venom Anaphylaxis, Swelling   Septra [sulfamethoxazole-trimethoprim] Other (See Comments)   Vomit and loose stools   Other Itching, Rash, Other (See Comments)   Cannot tolerate any med that end in -CILLIN; also develops listers on her scalp   Penicillins Rash   Has patient had a PCN reaction causing immediate rash, facial/tongue/throat swelling, SOB or lightheadedness with hypotension: yes Has patient had a PCN reaction causing severe rash involving mucus membranes or skin necrosis: Unknown Has patient had a PCN reaction that required hospitalization: Unknown Has patient had a PCN reaction occurring within the last 10 years: yes If all of the above answers are "NO", then may  proceed with Cephalosporin use. Tolerated Cefepime and Rocephin in the past.   Vancomycin Itching, Rash        Medication List     STOP taking these medications    blood glucose meter kit and supplies Kit   Blood Pressure Monitor Misc   metFORMIN 500 MG tablet Commonly known as: GLUCOPHAGE   norethindrone 0.35 MG tablet Commonly known as: Ortho Micronor       TAKE these medications    acetaminophen 500 MG tablet Commonly known as: TYLENOL Take 2 tablets (1,000 mg total) by mouth  every 8 (eight) hours for 5 days.   ondansetron 4 MG disintegrating tablet Commonly known as: ZOFRAN-ODT Take 1 tablet (4 mg total) by mouth every 6 (six) hours as needed for nausea or vomiting.   oxyCODONE 5 MG immediate release tablet Commonly known as: Oxy IR/ROXICODONE Take 1 tablet (5 mg total) by mouth every 6 (six) hours as needed for severe pain.   pantoprazole 40 MG tablet Commonly known as: PROTONIX Take 1 tablet (40 mg total) by mouth daily.               Discharge Care Instructions  (From admission, onward)           Start     Ordered   04/06/21 0000  Discharge wound care:       Comments: Remove Bandaids tomorrow, ok to shower tomorrow. Steristrips may fall off in 1-3 weeks.   04/06/21 8032            Follow-up Information     [name], [name], MD. Go on 04/28/2021.   Specialty: General Surgery Why: at 11:20am.  Please arrive 15 minutes prior to your appointment time.  Thank you. Contact information: [place] [address] [phone]         Surgery, [place]. Go on 05/27/2021.   Specialty: General Surgery Why: at 1:30pm with Dr. Kieth Brightly.  Please arrive 15 minutes prior to your appointment time.  Thank you. Contact information: [place] [address] [phone]                  The results of significant diagnostics from this hospitalization (including imaging, microbiology, ancillary and laboratory) are listed below for reference.    Significant Diagnostic Studies: No results found.  Labs: Basic Metabolic Panel: Recent Labs  Lab 04/06/21 0444  NA 136  K 4.2  CL 102  CO2 27  GLUCOSE 136*  BUN 10  CREATININE 0.86  CALCIUM 8.5*   Liver Function Tests: Recent Labs  Lab 04/06/21 0444  AST 18  ALT 20  ALKPHOS 65  BILITOT 0.4  PROT 7.2  ALBUMIN 3.5    CBC: Recent Labs  Lab 04/05/21 1051 04/06/21 0444  WBC  --  11.9*  NEUTROABS  --  8.6*  HGB  13.5 12.2  HCT 42.9 39.4  MCV  --  83.3  PLT  --  258    CBG: Recent Labs  Lab 04/05/21 1615 04/05/21 2020 04/05/21 2332 04/06/21 0345 04/06/21 0713  GLUCAP 173* 165* 141* 123* 123*    Principal Problem:   Morbid (severe) obesity due to excess calories ([place])   VTE plan: no chemical prophylaxis recommended ([email or web address])  Time coordinating discharge: 15 min

## 2021-04-06 NOTE — Discharge Instructions (Signed)

## 2021-04-06 NOTE — Progress Notes (Signed)
Patient alert and oriented, pain is controlled. Patient is tolerating fluids, advanced to protein shake today, patient is tolerating well. Reviewed Gastric Bypass discharge instructions with patient and patient is able to articulate understanding. Provided information on BELT program, Support Group and [facility] outpatient pharmacy. All questions answered, will continue to monitor.    

## 2021-04-06 NOTE — Progress Notes (Signed)
Started 1st cup of protein now. No c/o nausea and vomiting. Will continue to monitor.

## 2021-04-06 NOTE — Progress Notes (Signed)
Transition of Care [facility]) Screening Note  Patient Details  Name: TAIZ BICKLE Date of Birth: 1986-02-06  Transition of Care [facility]) CM/SW Contact:    Ewing Schlein, LCSW Phone Number: 04/06/2021, 10:51 AM  Transition of Care Department [facility]) has reviewed patient and no TOC needs have been identified at this time. We will continue to monitor patient advancement through interdisciplinary progression rounds. If new patient transition needs arise, please place a TOC consult.

## 2021-04-07 NOTE — Progress Notes (Signed)
24hr fluid recall prior to discharge: 660mL. Per dehydration protocol, will call pt to f/u within one week post op. ?

## 2021-04-12 ENCOUNTER — Telehealth (HOSPITAL_COMMUNITY): Payer: Self-pay | Admitting: *Deleted

## 2021-04-12 NOTE — Telephone Encounter (Signed)
1.  Tell me about your pain and pain management? Pt denies any pain.  2.  Let's talk about fluid intake.  How much total fluid are you taking in? Pt states that she is getting in more than 64oz of fluid including protein shakes, bottled water, and Powerade. Pt instructed to assess status and suggestions daily utilizing Hydration Action Plan on discharge folder and to call [facility] if in the "red zone".   3.  How much protein have you taken in the last 2 days? Pt states she is meeting her goal of 60g of protein each day with the protein shakes.  4.  Have you had nausea?  Tell me about when have experienced nausea and what you did to help? Pt denies nausea.   5.  Has the frequency or color changed with your urine? Pt states that she is urinating "fine" with no changes in frequency or urgency.     6.  Tell me what your incisions look like? "Incisions look fine" with the exception of 1 that has lost its steri-strips "right under my breast".  Pt states it "opened up a little and was stinging".  Pt denies a fever, chills.  Pt states that the other incisions are "fine" and are not swollen, open, or draining.  Pt encouraged to call [facility] for concern about incision.     7.  Have you been passing gas? BM? Pt states that she is having BMs. Last BM 04/12/21.    8.  If a problem or question were to arise who would you call?  Do you know contact numbers for [facility], [facility], and [facility]? Pt denies dehydration symptoms.  Pt can describe s/sx of dehydration.  Pt knows to call [facility] for surgical, [facility] for nutrition, and [facility] for non-urgent questions or concerns.   9.  How has the walking going? Pt states she is walking around and able to be active without difficulty.   10. Are you still using your incentive spirometer?  If so, how often? Pt states that she is doing I.S. every two hours. Pt encouraged to use incentive spirometer, at least 10x every hour while awake until she sees the surgeon.  11.  How are your vitamins and  calcium going?  How are you taking them? Pt states that she is taking her supplements and vitamins without difficulty.  Reminded patient that the first 30 days post-operatively are important for successful recovery.  Practice good hand hygiene, wearing a mask when appropriate (since optional in most places), and minimizing exposure to people who live outside of the home, especially if they are exhibiting any respiratory, GI, or illness-like symptoms.

## 2021-04-20 ENCOUNTER — Other Ambulatory Visit: Payer: Self-pay

## 2021-04-20 ENCOUNTER — Encounter: Payer: Medicare HMO | Attending: General Surgery | Admitting: Skilled Nursing Facility1

## 2021-04-20 DIAGNOSIS — E1165 Type 2 diabetes mellitus with hyperglycemia: Secondary | ICD-10-CM | POA: Insufficient documentation

## 2021-04-21 NOTE — Progress Notes (Signed)
2 Week Post-Operative Nutrition Class   Patient was seen on 04/21/2021 for Post-Operative Nutrition education at the [facility].    Surgery date: 02/15/2021 Surgery type: RYGB Start weight at [facility]: 374.3 pounds Weight today: pt declined due to lymphedema stockings  Bowel Habits: Every day to every other day no complaints      The following the learning objectives were met by the patient during this course: Identifies Phase 3 (Soft, High Proteins) Dietary Goals and will begin from 2 weeks post-operatively to 2 months post-operatively Identifies appropriate sources of fluids and proteins  Identifies appropriate fat sources and healthy verses unhealthy fat types   States protein recommendations and appropriate sources post-operatively Identifies the need for appropriate texture modifications, mastication, and bite sizes when consuming solids Identifies appropriate fat consumption and sources Identifies appropriate multivitamin and calcium sources post-operatively Describes the need for physical activity post-operatively and will follow MD recommendations States when to call healthcare provider regarding medication questions or post-operative complications   Handouts given during class include: Phase 3A: Soft, High Protein Diet Handout Phase 3 High Protein Meals Healthy Fats   Follow-Up Plan: Patient will follow-up at [facility] in 6 weeks for 2 month post-op nutrition visit for diet advancement per MD.

## 2021-04-26 ENCOUNTER — Telehealth: Payer: Self-pay | Admitting: Skilled Nursing Facility1

## 2021-04-26 NOTE — Telephone Encounter (Signed)
RD called pt to verify fluid intake once starting soft, solid proteins 2 week post-bariatric surgery.   Daily Fluid intake: 64 oz Daily Protein intake: 60 g Bowel Habits: some constipation so started mirilax  Concerns/issues:  Pt wonders about popcorn. Dietitian advised to avoid popcorn as this is the protein only phase and popcorn is not a significant source of protein.

## 2021-04-26 NOTE — Telephone Encounter (Signed)
RD called pt to verify fluid intake once starting soft, solid proteins 2 week post-bariatric surgery.   Daily Fluid intake:  Daily Protein intake: Bowel Habits:   Concerns/issues:    LVM 

## 2021-06-02 ENCOUNTER — Encounter: Payer: Medicare Other | Attending: General Surgery | Admitting: Skilled Nursing Facility1

## 2021-06-02 ENCOUNTER — Other Ambulatory Visit: Payer: Self-pay

## 2021-06-02 DIAGNOSIS — E1165 Type 2 diabetes mellitus with hyperglycemia: Secondary | ICD-10-CM | POA: Insufficient documentation

## 2021-06-02 NOTE — Progress Notes (Signed)
Bariatric Nutrition Follow-Up Visit Medical Nutrition Therapy    NUTRITION ASSESSMENT    Surgery date: 02/15/2021 Surgery type: RYGB Start weight at [facility]: 374.3 pounds Weight today: 336.2 pounds  Body Composition Scale 06/02/2021  Weight  lbs 336.2  Total Body Fat  % 48.7     Visceral Fat 17  Fat-Free Mass  % 51.2     Total Body Water  % 40.1     Muscle-Mass  lbs 37.2  BMI 49.5  Body Fat Displacement ---        Torso  lbs 101.7        Left Leg  lbs 20.3        Right Leg  lbs 20.3        Left Arm  lbs 10.1        Right Arm  lbs 10.1   Clinical  Medical hx: GDM, lymphedema, DM Medications: vitamin D Labs: vitamin D 33, A1C 5.9, cholesterol 244, triglycerides 350, creatinine 1.25  Notable signs/symptoms: N/A Any previous deficiencies? Iron, vitamin D   Lifestyle & Dietary Hx  Pt states she thinks her lymphoedema is getting better and does not drag her leg like she used too and feels maybe the swelling has gone down a little.  Pt states she has been doing mirilax every other day for constipation.   Pt states she did throw up one time from soup: dietitian advised even though it is soup that can still be too much volume.   Pt states she has been winging it with her diet because she left the paper work in her car that broke down after her last appt. And did not call dietitian because she did not want to bother her.   Dietitian spent the appt educating pt on: balanced meal creation, nutrition facts lable reading, appropriate portion sizes, additions to food for flavor which are not calorically dense   Estimated daily fluid intake: 125 oz Estimated daily protein intake: 60 g Supplements: multivitamin and calcium (taking at the same time) Current average weekly physical activity: walking 3 days a week at the park aiming for 1 mile, fiton app   24-Hr Dietary Recall First Meal 11am : scrambled egg and [place] sausage or [place] kielbasa + peppers + onions + fried egg or skipped   Snack:   Second Meal: sweet potato and [place] or skipped Snack:   Third Meal 5pm: baked sweet potato salt + pepper + cinnomon  Snack:  Beverages: celery sometimes with other fruits and lemon water, gatorade zero  Post-Op Goals/ Signs/ Symptoms Using straws: no Drinking while eating: no Chewing/swallowing difficulties: no Changes in vision: no Changes to mood/headaches: no Hair loss/changes to skin/nails: no Difficulty focusing/concentrating: no Sweating: no Limb weakness: no Dizziness/lightheadedness: no Palpitations: no  Carbonated/caffeinated beverages: no N/V/D/C/Gas: yes Abdominal pain: no Dumping syndrome: no    NUTRITION DIAGNOSIS  Overweight/obesity ([place]-3.3) related to past poor dietary habits and physical inactivity as evidenced by completed bariatric surgery and following dietary guidelines for continued weight loss and healthy nutrition status.     NUTRITION INTERVENTION Nutrition counseling (C-1) and education (E-2) to facilitate bariatric surgery goals, including: Diet advancement to the next phase (phase 4) now including non starchy vegetables  The importance of consuming adequate calories as well as certain nutrients daily due to the body's need for essential vitamins, minerals, and fats The importance of daily physical activity and to reach a goal of at least 150 minutes of moderate to vigorous physical activity weekly (or  as directed by their physician) due to benefits such as increased musculature and improved lab values The importance of intuitive eating specifically learning hunger-satiety cues and understanding the importance of learning a new body: The importance of mindful eating to avoid grazing behaviors  Educated pt on serving sizes and label reading  Limit gatoarde zero   [place]: -Continue to aim for a minimum of 64 fluid ounces 7 days a week with at least 30 ounces being plain water: cut your fluid down to 80 ounces   -Eat non-starchy vegetables 2  times a day 7 days a week  -Start out with soft cooked vegetables today and tomorrow; if tolerated begin to eat raw vegetables or cooked including salads  -Eat your 3 ounces of protein first then start in on your non-starchy vegetables; once you understand how much of your meal leads to satisfaction and not full while still eating 3 ounces of protein and non-starchy vegetables you can eat them in any order   -Continue to aim for 30 minutes of activity at least 5 times a week  -Do NOT cook with/add to your food: alfredo sauce, cheese sauce, barbeque sauce, ketchup, fat back, butter, bacon grease, grease, Crisco, OR SUGAR  -take your multi and calcium 2 hours apart and 3 calcium all 2 hours a apart  -limit gatorade zero to 2 bottles a day due to pts fear of excess sodium  -aim to eat every 3-5 hours starting within 1-1.5 hours of waking and stopping 3 hours before bed  Handouts Provided Include  Meal ideas sheet  Learning Style & Readiness for Change Teaching method utilized: Visual & Auditory  Demonstrated degree of understanding via: Teach Back  Readiness Level: action Barriers to learning/adherence to lifestyle change: none identified   RD's Notes for Next Visit Assess adherence to pt chosen goals    MONITORING & EVALUATION Dietary intake, weekly physical activity, body weight  Next Steps Patient is to follow-up in 2 months

## 2021-06-23 ENCOUNTER — Encounter (INDEPENDENT_AMBULATORY_CARE_PROVIDER_SITE_OTHER): Payer: Medicare HMO | Admitting: Nurse Practitioner

## 2021-08-02 ENCOUNTER — Ambulatory Visit: Payer: Medicare Other | Admitting: Skilled Nursing Facility1

## 2021-09-24 ENCOUNTER — Telehealth: Payer: 59 | Admitting: Physician Assistant

## 2021-09-24 ENCOUNTER — Encounter: Payer: 59 | Admitting: Physician Assistant

## 2021-09-24 DIAGNOSIS — H60393 Other infective otitis externa, bilateral: Secondary | ICD-10-CM

## 2021-09-24 MED ORDER — NEOMYCIN-POLYMYXIN-HC 3.5-10000-1 OT SOLN: 3.0000 [drp] | Freq: Four times a day (QID) | OTIC | 0 refills | Status: DC

## 2021-09-24 NOTE — Progress Notes (Signed)
Error.  Appt needed for son.

## 2021-09-24 NOTE — Progress Notes (Signed)

## 2021-11-07 ENCOUNTER — Telehealth: Payer: 59 | Admitting: Family

## 2021-11-07 NOTE — Progress Notes (Signed)
Will close chart. Can not do visit under mothers chart.   Jannifer Rodney, FNP

## 2021-12-16 ENCOUNTER — Encounter: Payer: 59 | Attending: General Surgery | Admitting: Skilled Nursing Facility1

## 2021-12-16 ENCOUNTER — Encounter: Payer: Self-pay | Admitting: Skilled Nursing Facility1

## 2021-12-16 DIAGNOSIS — E669 Obesity, unspecified: Secondary | ICD-10-CM | POA: Insufficient documentation

## 2021-12-16 DIAGNOSIS — E1165 Type 2 diabetes mellitus with hyperglycemia: Secondary | ICD-10-CM | POA: Diagnosis present

## 2021-12-16 NOTE — Progress Notes (Addendum)
Bariatric Nutrition Follow-Up Visit Medical Nutrition Therapy    NUTRITION ASSESSMENT    Surgery date: 02/15/2021 Surgery type: RYGB Start weight at [facility]: 374.3 pounds Weight today: 285 pounds  Body Composition Scale 06/02/2021 12/16/2021  Weight  lbs 336.2 285  Total Body Fat  % 48.7 45     Visceral Fat 17 14  Fat-Free Mass  % 51.2 54.9     Total Body Water  % 40.1 41.9     Muscle-Mass  lbs 37.2 36.7  BMI 49.5 41.9  Body Fat Displacement ---         Torso  lbs 101.7 79.6        Left Leg  lbs 20.3 15.9        Right Leg  lbs 20.3 15.9        Left Arm  lbs 10.1 7.9        Right Arm  lbs 10.1 7.9   Clinical  Medical hx: GDM, lymphedema, DM Medications: vitamin D, metformin Labs: vitamin D 33, A1C 5.9, cholesterol 244, triglycerides 350, creatinine 1.25  Notable signs/symptoms: N/A Any previous deficiencies? Iron, vitamin D   Lifestyle & Dietary Hx  Pt states she has not pooped in a month: Dietitian asked if she is exaggerated, pt states no I literally have had no fecal matter come out in a month. Pt states she has been trying mirialx and enemas and dulcolax.   Pt states she has had no nausea or vomiting.   Pt does not check her blood sugars.   Estimated daily fluid intake: 115 oz Estimated daily protein intake: 60 g Supplements: multivitamin bari melt and calcium Current average weekly physical activity: walking 3 days a week at the park aiming for 1 mile, fiton app   24-Hr Dietary Recall: eating like this at the first of the month since being constipated First Meal 11am: protein shake (avoiding solids due to constipation) Snack:   Second Meal: skipped or skinny popcorn Snack:   Third Meal 5pm: protein shake Snack:  Beverages: celery sometimes with other fruits and lemon water, gatorade zero, water  Post-Op Goals/ Signs/ Symptoms Using straws: no Drinking while eating: no Chewing/swallowing difficulties: no Changes in vision: no Changes to mood/headaches:  no Hair loss/changes to skin/nails: no Difficulty focusing/concentrating: no Sweating: no Limb weakness: no Dizziness/lightheadedness: no Palpitations: no  Carbonated/caffeinated beverages: no N/V/D/C/Gas: yes Abdominal pain: no Dumping syndrome: no    NUTRITION DIAGNOSIS  Overweight/obesity ([place]-3.3) related to past poor dietary habits and physical inactivity as evidenced by completed bariatric surgery and following dietary guidelines for continued weight loss and healthy nutrition status.     NUTRITION INTERVENTION Nutrition counseling (C-1) and education (E-2) to facilitate bariatric surgery goals, including: The importance of consuming adequate calories as well as certain nutrients daily due to the body's need for essential vitamins, minerals, and fats The importance of daily physical activity and to reach a goal of at least 150 minutes of moderate to vigorous physical activity weekly (or as directed by their physician) due to benefits such as increased musculature and improved lab values The importance of intuitive eating specifically learning hunger-satiety cues and understanding the importance of learning a new body: The importance of mindful eating to avoid grazing behaviors  Educated pt on serving sizes and label reading  Limit gatoarde zero  Encouraged patient to honor their body's internal hunger and fullness cues.  Throughout the day, check in mentally and rate hunger. Stop eating when satisfied not full regardless of how  much food is left on the plate.  Get more if still hungry 20-30 minutes later.  The key is to honor satisfaction so throughout the meal, rate fullness factor and stop when comfortably satisfied not physically full. The key is to honor hunger and fullness without any feelings of guilt or shame.  Pay attention to what the internal cues are, rather than any external factors. This will enhance the confidence you have in listening to your own body and following those  internal cues enabling you to increase how often you eat when you are hungry not out of appetite and stop when you are satisfied not full.  Encouraged pt to continue to eat balanced meals inclusive of non starchy vegetables 2 times a day 7 days a week Encouraged pt to choose lean protein sources: limiting beef, pork, sausage, hotdogs, and lunch meat Encourage pt to choose healthy fats such as plant based limiting animal fats Encouraged pt to continue to drink a minium 64 fluid ounces with half being plain water to satisfy proper hydration   Goals:  Dietitian advised pt to call her doctor tomorrow about her constipation   Handouts Previously Provided Include  Meal ideas sheet  Learning Style & Readiness for Change Teaching method utilized: Visual & Auditory  Demonstrated degree of understanding via: Teach Back  Readiness Level: action Barriers to learning/adherence to lifestyle change: none identified   RD's Notes for Next Visit Assess adherence to pt chosen goals    MONITORING & EVALUATION Dietary intake, weekly physical activity, body weight  Next Steps Patient is to follow-up in 2 months

## 2022-02-22 ENCOUNTER — Ambulatory Visit: Payer: 59 | Admitting: Skilled Nursing Facility1

## 2022-04-26 IMAGING — RF DG UGI W SINGLE CM
9 of 10 series · 15 of 19 positions shown · non-contrast
Comparison: None

CLINICAL DATA: Morbid obesity, preoperative evaluation

EXAM:
UPPER GI SERIES WITH KUB
TECHNIQUE: After obtaining a scout radiograph a routine upper GI series was
performed using thin barium.
FLUOROSCOPY TIME:  Fluoroscopy Time: 0 minutes 57 seconds
Radiation Exposure Index (if provided by the fluoroscopic device):
92.8 mGy
Number of Acquired Spot Images: 4 plus multiple fluoroscopic screen
captures

[Series 1: t ap supine · 0.15mm/px · 1 of 1 slices shown (1 of 2)]
[im 1/1]
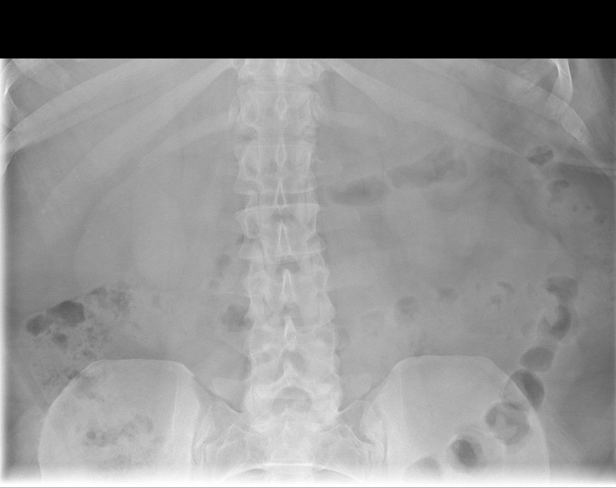

[Series 2: t ap supine · 0.15mm/px · 1 of 1 slices shown (2 of 2)]
[im 1/1]
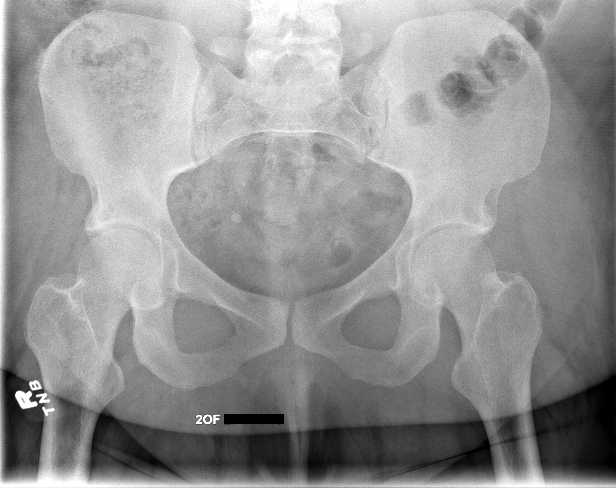

[Series 3: cp_standard · 0.17mm/px · 3 of 107 frames shown (1 of 7)]
[frame 54/107]
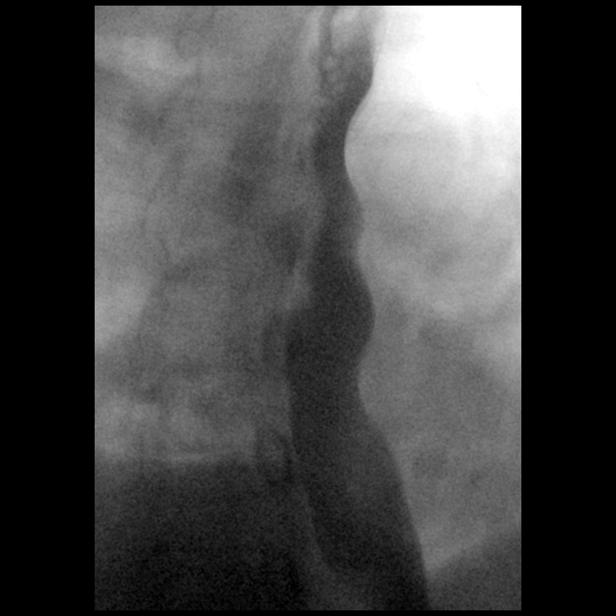
[frame 77/107]
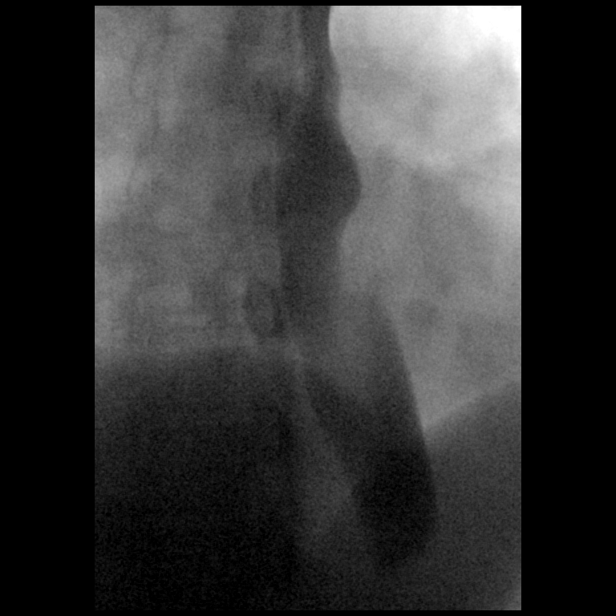
[frame 91/107]
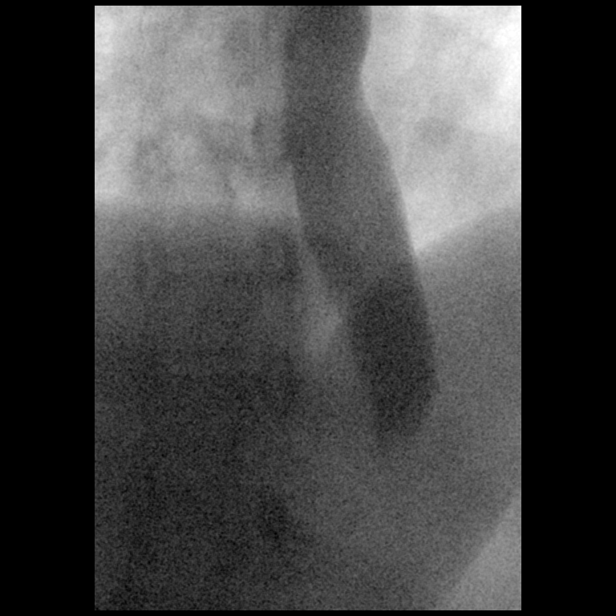

[Series 4: cp_standard · 0.17mm/px · 3 of 61 frames shown (2 of 7)]
[frame 5/61]
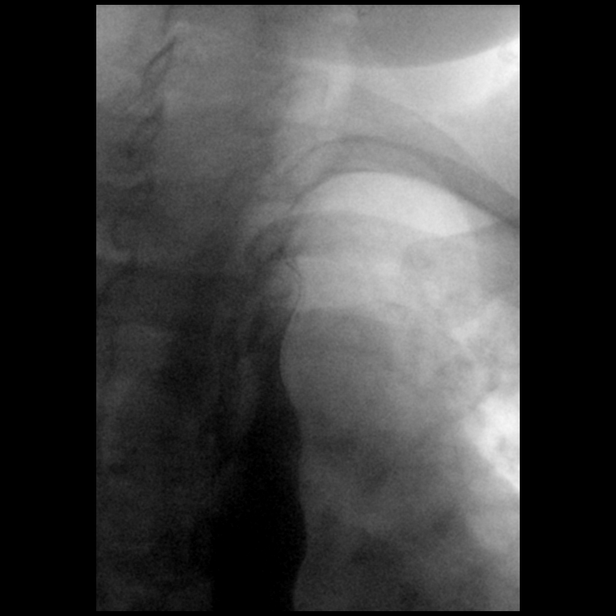
[frame 31/61]
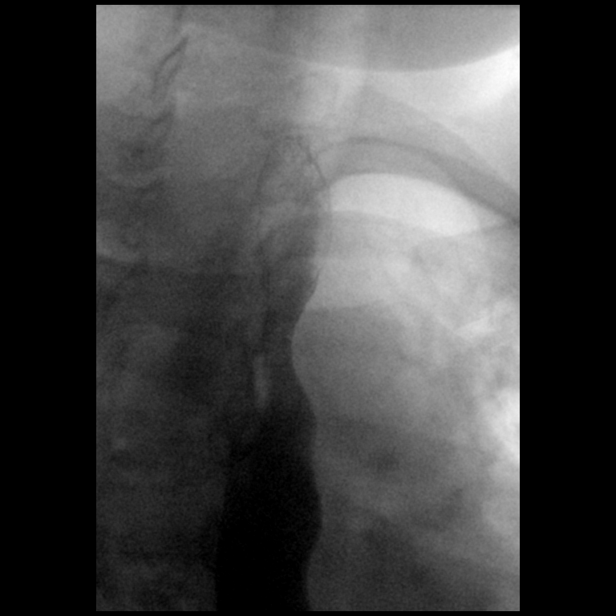
[frame 52/61]
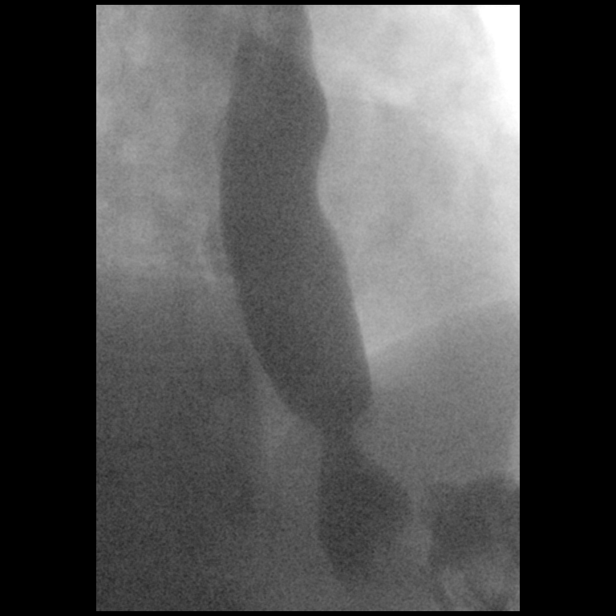

[Series 5: cp_standard · 0.17mm/px · 3 of 28 frames shown (3 of 7)]
[frame 5/28]
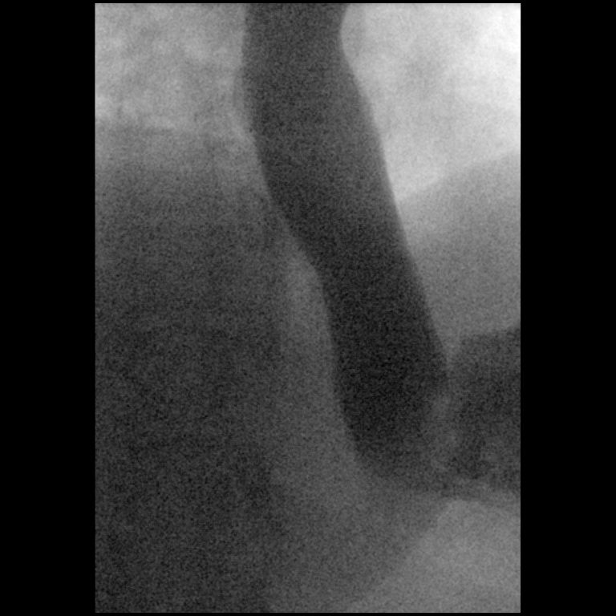
[frame 15/28]
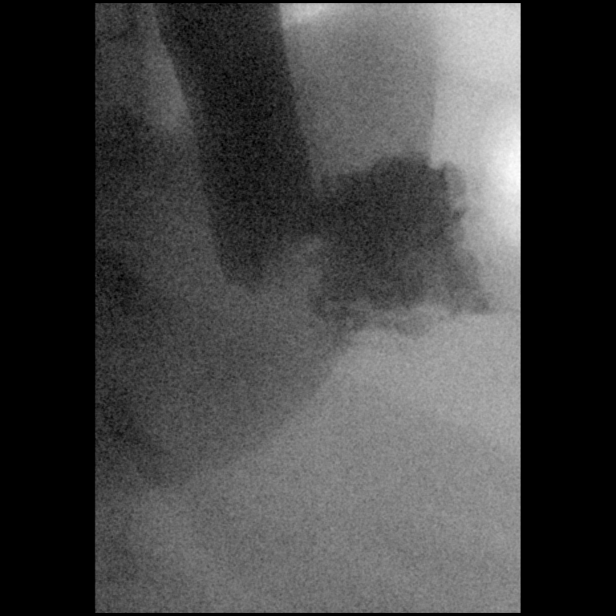
[frame 24/28]
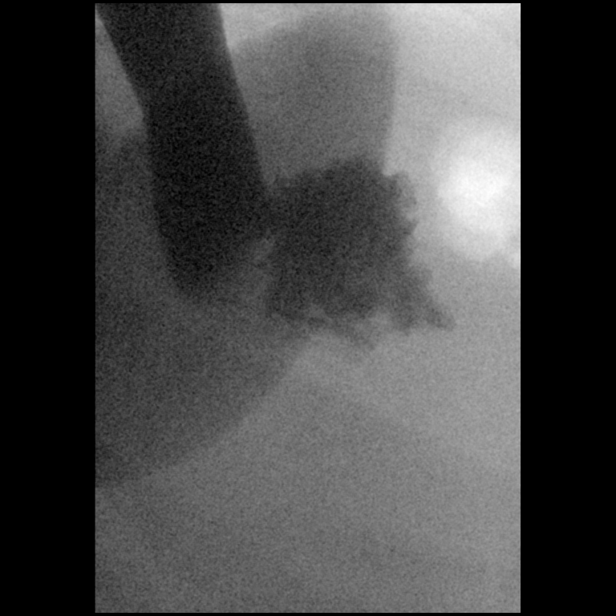

[Series 6: cp_standard · 0.17mm/px · 1 of 1 slices shown (4 of 7)]
[im 1/1]
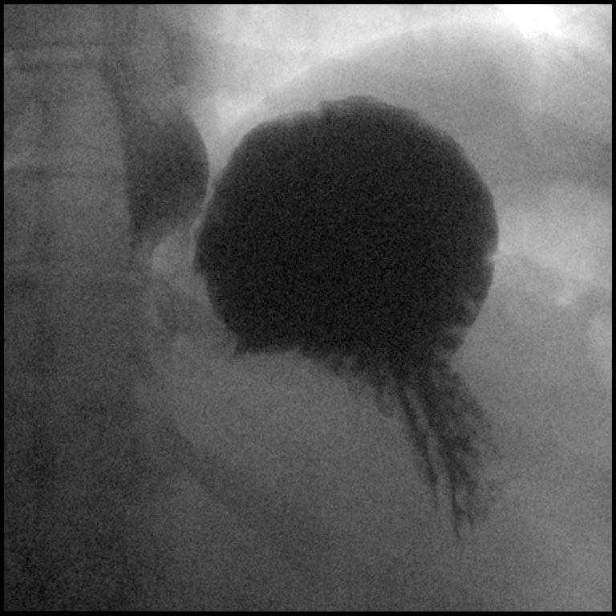

[Series 7: cp_standard · 0.17mm/px · 1 of 1 slices shown (5 of 7)]
[im 1/1]
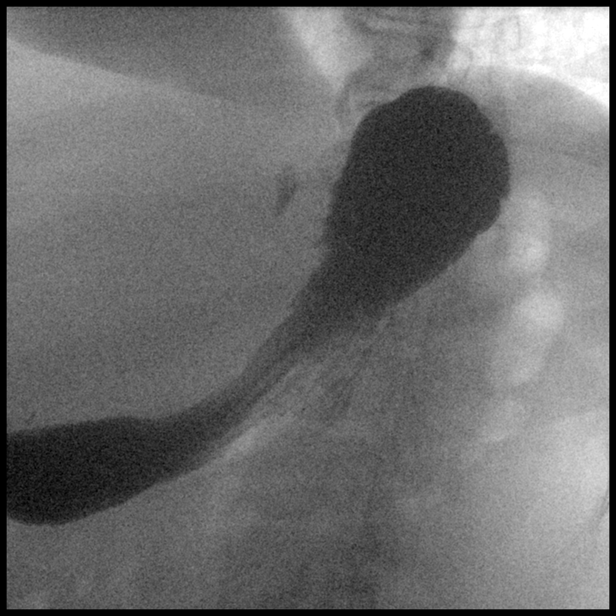

[Series 9: cp_standard · 0.17mm/px · 1 of 1 slices shown (6 of 7)]
[im 1/1]
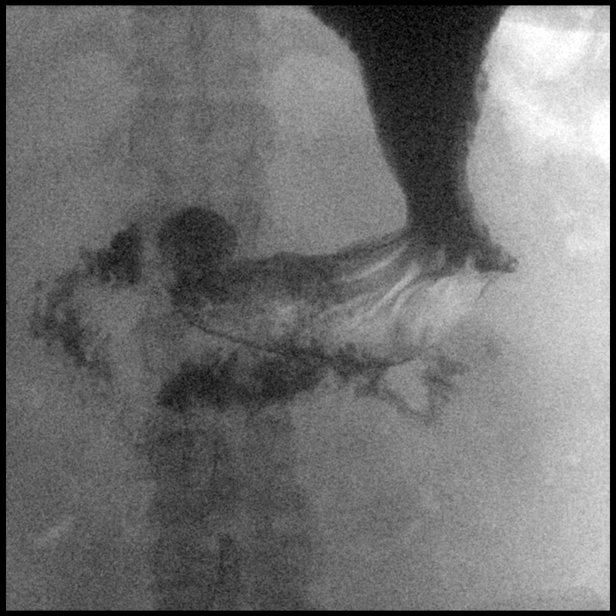

[Series 10: cp_standard · 0.26mm/px · 1 of 1 slices shown (7 of 7)]
[im 1/1]
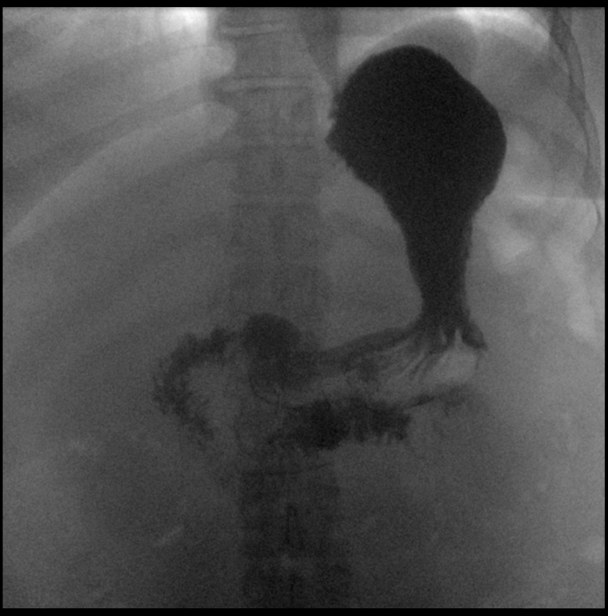

[15 of 19 positions shown; findings below may reference images not displayed]

FINDINGS: Normal esophageal distention and motility.

No esophageal mass or stricture.

Stomach normal in position and distention.

Normal rugal folds.

No mass, ulceration, or outlet obstruction.

Duodenal bulb and sweep normal in position and appearance.

Ligament of Treitz normal in position.

Visualized jejunal loops unremarkable.
IMPRESSION: Normal exam.

## 2022-04-26 IMAGING — DX DG CHEST 2V
2 series · 2 of 2 positions shown · non-contrast
Comparison: 10/21/2017

CLINICAL DATA: Pre bariatric surgery, former smoker, GERD

EXAM:
CHEST - 2 VIEW

[chest pa]
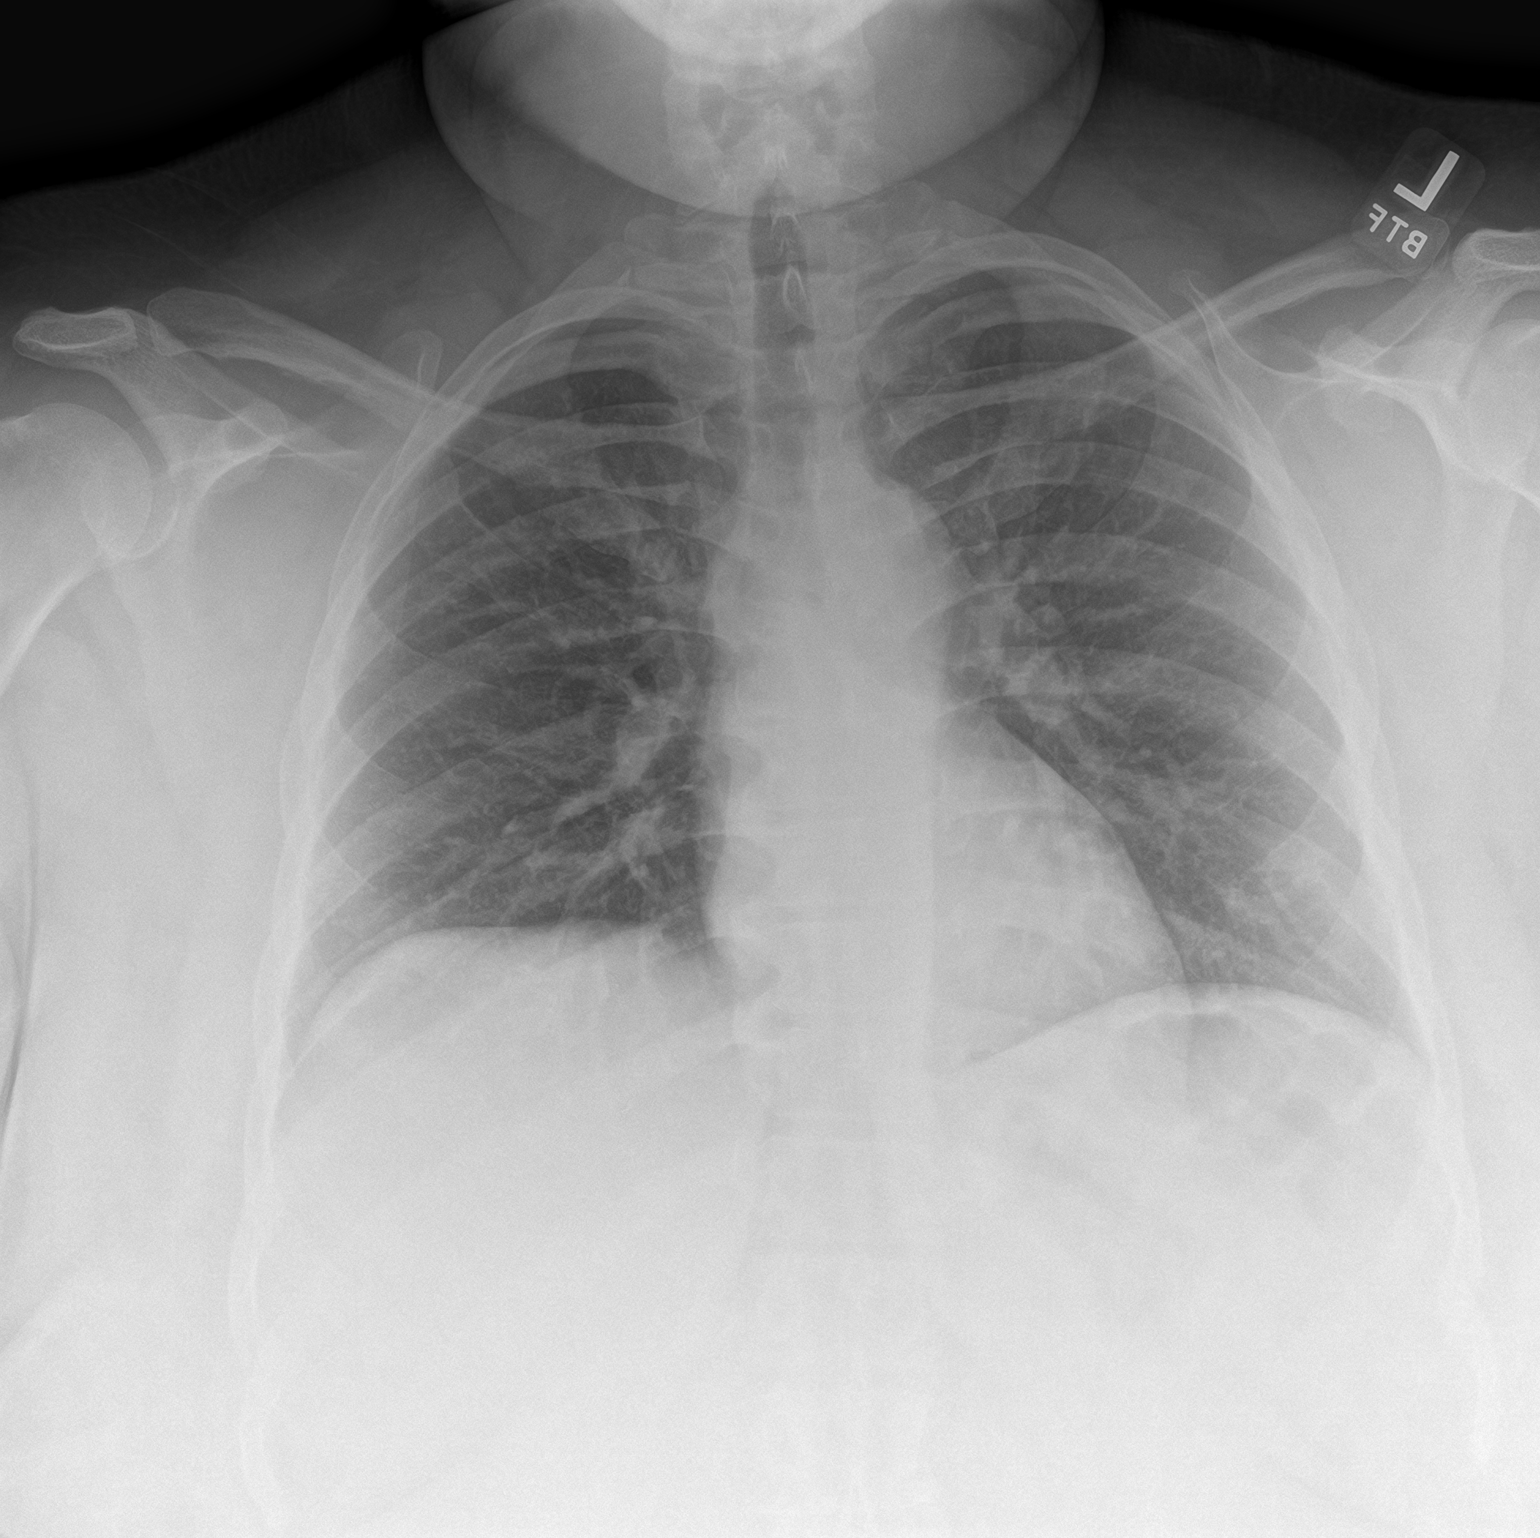

[chest lat]
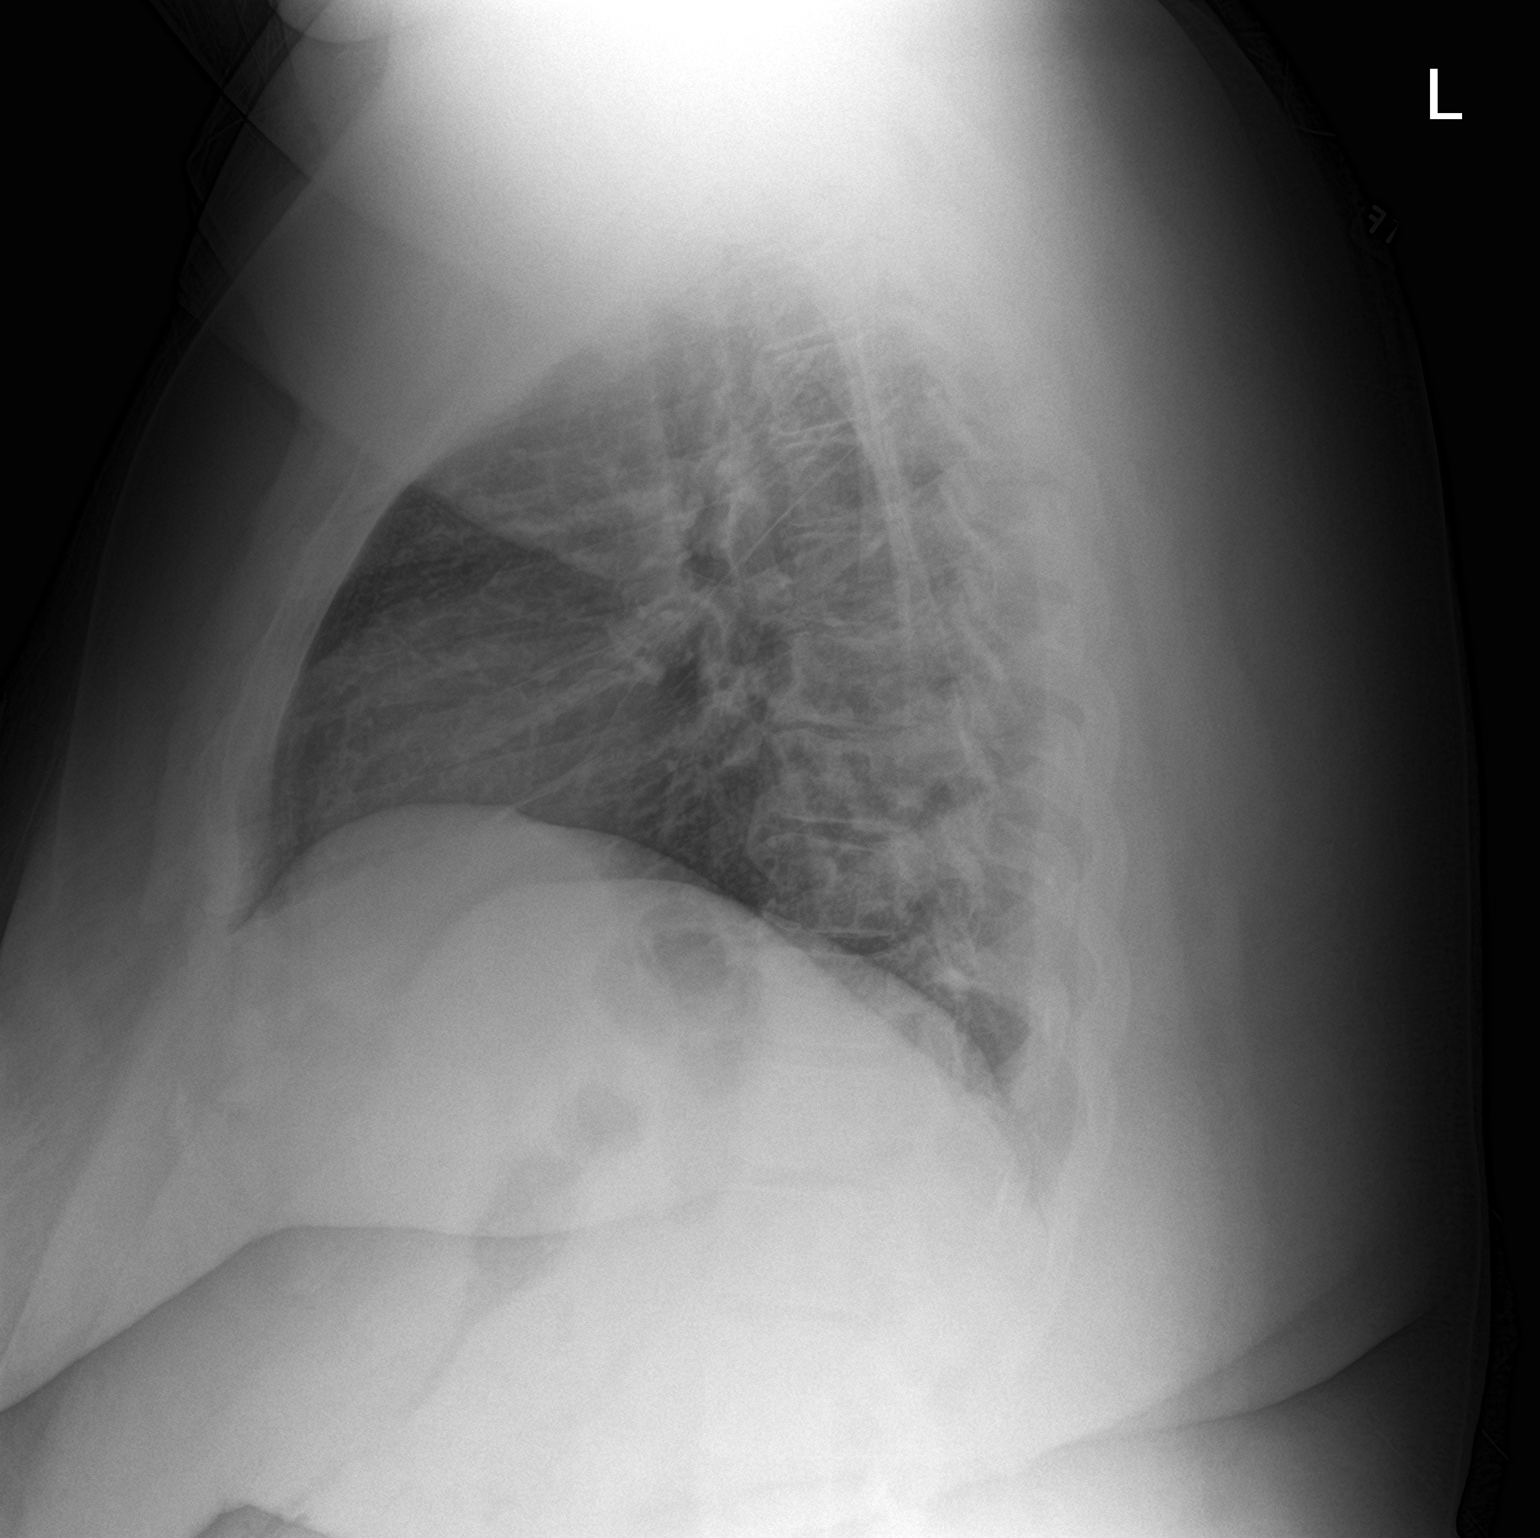

[2 of 2 positions shown; findings below may reference images not displayed]

FINDINGS: Normal heart size, mediastinal contours, and pulmonary vascularity.

Lungs clear.

No pleural effusion or pneumothorax.

Bones unremarkable.
IMPRESSION: Normal exam.

## 2022-06-08 DIAGNOSIS — Z9884 Bariatric surgery status: Secondary | ICD-10-CM | POA: Diagnosis not present

## 2022-06-08 DIAGNOSIS — Z8632 Personal history of gestational diabetes: Secondary | ICD-10-CM | POA: Diagnosis not present

## 2022-06-08 DIAGNOSIS — I890 Lymphedema, not elsewhere classified: Secondary | ICD-10-CM | POA: Diagnosis not present

## 2022-07-05 DIAGNOSIS — Z8632 Personal history of gestational diabetes: Secondary | ICD-10-CM | POA: Insufficient documentation

## 2022-07-05 DIAGNOSIS — Z9884 Bariatric surgery status: Secondary | ICD-10-CM | POA: Insufficient documentation

## 2022-07-10 LAB — PANORAMA PRENATAL TEST FULL PANEL:PANORAMA TEST PLUS 5 ADDITIONAL MICRODELETIONS: FETAL FRACTION: 9.3

## 2022-09-05 DIAGNOSIS — M542 Cervicalgia: Secondary | ICD-10-CM | POA: Diagnosis not present

## 2022-09-05 DIAGNOSIS — M5431 Sciatica, right side: Secondary | ICD-10-CM | POA: Diagnosis not present

## 2022-09-05 DIAGNOSIS — G894 Chronic pain syndrome: Secondary | ICD-10-CM | POA: Diagnosis not present

## 2022-09-05 DIAGNOSIS — M5432 Sciatica, left side: Secondary | ICD-10-CM | POA: Diagnosis not present

## 2022-09-05 DIAGNOSIS — I871 Compression of vein: Secondary | ICD-10-CM | POA: Diagnosis not present

## 2022-09-30 ENCOUNTER — Other Ambulatory Visit: Payer: Self-pay

## 2022-09-30 ENCOUNTER — Emergency Department (HOSPITAL_COMMUNITY)
Admission: EM | Admit: 2022-09-30 | Discharge: 2022-09-30 | Disposition: A | Discharge status: Home / Self Care | Payer: 59 | Attending: Emergency Medicine | Admitting: Emergency Medicine

## 2022-09-30 DIAGNOSIS — O200 Threatened abortion: Secondary | ICD-10-CM | POA: Insufficient documentation

## 2022-09-30 DIAGNOSIS — Z3A24 24 weeks gestation of pregnancy: Secondary | ICD-10-CM

## 2022-09-30 NOTE — ED Notes (Signed)
Pt taken directly to room 1, placed on toco monitor.  HR of baby 153.  Mom 87 HR  Rapid OB marissa called and all questions answered.  Will consult physician and call back with recommendations.

## 2022-09-30 NOTE — Progress Notes (Signed)
2146-Received call from [facility] staff for patient presenting with below:  Pt had OB appt on 6/5 with [facility].   Has not felt baby move since then.   Pt states she has not felt a flutter or  movement of the baby. No discharge,  no bleeding, no gush of fluid.   Pt has had intercourse in the past 2  days.  No falls or injuries to belly or her  person.          [facility] staff placed patient on monitors with FHR of 153 and patients pulse is 87. Patient is G3P1 24.3 with history of prior miscarrage at 12 weeks.   2208- Called OB attending Dr. Donavan Foil, reviewed above information as well as FHT and notes from patients last OB visit. Patient cleared by OB. Patient is to call OB office on Monday to schedule follow up regarding DFM. Patient can also come to [facility] for any further concerns.   2214- Called [facility] staff and provided above information from Dr. Donavan Foil. [facility] staff verbalized understanding of instructions. Will call back if patient status changes.   Lovenia Shuck, RN [facility]

## 2022-09-30 NOTE — Discharge Instructions (Signed)
Your baby has a good heart rate and is moving.  Follow-up with your obstetrician as needed.

## 2022-09-30 NOTE — ED Triage Notes (Signed)
Pt had OB appt on 6/5 with [facility].  Has not felt baby move since then.  Pt states she has not felt a flutter or movement of the baby. No discharge, no bleeding, no gush of fluid.  Pt has had intercourse in the past 2 days.  No falls or injuries to belly or her person.
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
## 2022-10-05 ENCOUNTER — Telehealth: Payer: Self-pay | Admitting: *Deleted

## 2022-10-05 NOTE — Telephone Encounter (Signed)
Transition Care Management Unsuccessful Follow-up Telephone Call  Date of discharge and from where:  [name]  09/29/2022  Attempts:  1st Attempt  Reason for unsuccessful TCM follow-up call:  Left voice message

## 2022-10-25 DIAGNOSIS — Z23 Encounter for immunization: Secondary | ICD-10-CM | POA: Diagnosis not present

## 2022-10-25 DIAGNOSIS — Z131 Encounter for screening for diabetes mellitus: Secondary | ICD-10-CM | POA: Diagnosis not present

## 2022-11-17 ENCOUNTER — Encounter (HOSPITAL_COMMUNITY): Payer: Self-pay | Admitting: *Deleted

## 2022-11-28 DIAGNOSIS — O99810 Abnormal glucose complicating pregnancy: Secondary | ICD-10-CM | POA: Insufficient documentation

## 2023-01-11 DIAGNOSIS — Z882 Allergy status to sulfonamides status: Secondary | ICD-10-CM | POA: Diagnosis not present

## 2023-01-11 DIAGNOSIS — Z880 Allergy status to penicillin: Secondary | ICD-10-CM | POA: Diagnosis not present

## 2023-01-11 DIAGNOSIS — Z881 Allergy status to other antibiotic agents status: Secondary | ICD-10-CM | POA: Diagnosis not present

## 2023-01-11 DIAGNOSIS — Z7982 Long term (current) use of aspirin: Secondary | ICD-10-CM | POA: Diagnosis not present

## 2023-01-11 DIAGNOSIS — Z91030 Bee allergy status: Secondary | ICD-10-CM | POA: Diagnosis not present

## 2023-01-16 DIAGNOSIS — R109 Unspecified abdominal pain: Secondary | ICD-10-CM | POA: Diagnosis not present

## 2023-01-16 DIAGNOSIS — G8918 Other acute postprocedural pain: Secondary | ICD-10-CM | POA: Diagnosis not present

## 2023-01-16 DIAGNOSIS — Z4889 Encounter for other specified surgical aftercare: Secondary | ICD-10-CM | POA: Diagnosis not present

## 2023-01-16 DIAGNOSIS — K219 Gastro-esophageal reflux disease without esophagitis: Secondary | ICD-10-CM | POA: Diagnosis not present

## 2023-01-16 DIAGNOSIS — R1030 Lower abdominal pain, unspecified: Secondary | ICD-10-CM | POA: Diagnosis not present

## 2023-01-16 DIAGNOSIS — Z4801 Encounter for change or removal of surgical wound dressing: Secondary | ICD-10-CM | POA: Diagnosis not present

## 2023-01-16 DIAGNOSIS — Z87891 Personal history of nicotine dependence: Secondary | ICD-10-CM | POA: Diagnosis not present

## 2023-03-16 DIAGNOSIS — Z9884 Bariatric surgery status: Secondary | ICD-10-CM | POA: Diagnosis not present

## 2023-05-23 DIAGNOSIS — R0981 Nasal congestion: Secondary | ICD-10-CM | POA: Diagnosis not present

## 2023-05-23 DIAGNOSIS — R059 Cough, unspecified: Secondary | ICD-10-CM | POA: Diagnosis not present

## 2023-05-23 DIAGNOSIS — R509 Fever, unspecified: Secondary | ICD-10-CM | POA: Diagnosis not present

## 2023-06-25 ENCOUNTER — Telehealth: Admitting: Family Medicine

## 2023-06-25 DIAGNOSIS — N912 Amenorrhea, unspecified: Secondary | ICD-10-CM | POA: Diagnosis not present

## 2023-06-25 DIAGNOSIS — Z98891 History of uterine scar from previous surgery: Secondary | ICD-10-CM

## 2023-06-25 DIAGNOSIS — M545 Low back pain, unspecified: Secondary | ICD-10-CM | POA: Diagnosis not present

## 2023-06-25 NOTE — Progress Notes (Signed)
 Virtual Visit Consent   [name], you are scheduled for a virtual visit with a  provider today. Just as with appointments in the office, your consent must be obtained to participate. Your consent will be active for this visit and any virtual visit you may have with one of our providers in the next 365 days. If you have a MyChart account, a copy of this consent can be sent to you electronically.  As this is a virtual visit, video technology does not allow for your provider to perform a traditional examination. This may limit your provider's ability to fully assess your condition. If your provider identifies any concerns that need to be evaluated in person or the need to arrange testing (such as labs, EKG, etc.), we will make arrangements to do so. Although advances in technology are sophisticated, we cannot ensure that it will always work on either your end or our end. If the connection with a video visit is poor, the visit may have to be switched to a telephone visit. With either a video or telephone visit, we are not always able to ensure that we have a secure connection.  By engaging in this virtual visit, you consent to the provision of healthcare and authorize for your insurance to be billed (if applicable) for the services provided during this visit. Depending on your insurance coverage, you may receive a charge related to this service.  I need to obtain your verbal consent now. Are you willing to proceed with your visit today? [name] has provided verbal consent on 06/25/2023 for a virtual visit (video or telephone). Georgana Curio, FNP  Date: 06/25/2023 2:20 PM   Virtual Visit via Video Note   I, Georgana Curio, connected with  [name]  ([id number], 1985-09-22) on 06/25/23 at  2:15 PM EST by a video-enabled telemedicine application and verified that I am speaking with the correct person using two identifiers.  Location: Patient: Virtual Visit Location  Patient: Home Provider: Virtual Visit Location Provider: Home Office   I discussed the limitations of evaluation and management by telemedicine and the availability of in person appointments. The patient expressed understanding and agreed to proceed.    History of Present Illness: [name] is a 38 y.o. who identifies as a female who was assigned female at birth, and is being seen today for no period in 3 months. She had a period the first 2 months after c-section in September then none. She has back pain since the epidural. She denies severe abd pain. She has not done a preg test. She says back pain is worse with lifting her baby. Denies urinary sx. No fever. No discharge.   HPI: HPI  Problems:  Patient Active Problem List   Diagnosis Date Noted   Morbid (severe) obesity due to excess calories (HCC) 04/05/2021   Type 2 diabetes mellitus with hyperglycemia, without long-term current use of insulin (HCC) 09/30/2020   History of gestational hypertension    Sepsis (HCC) 10/21/2017   Left leg cellulitis 10/21/2017   Carpal tunnel syndrome on left 07/16/2017   Reduced vision 02/11/2015   PCO (polycystic ovaries) 01/15/2014   Heavy periods 01/08/2014   Metabolic syndrome X 05/12/2013   Hyperlipidemia LDL goal <100 02/13/2013   Prediabetes 02/13/2013   Anemia 11/08/2011   Eczema 03/31/2011   Depression 12/21/2010   Vitamin D deficiency 12/15/2010   Morbid obesity (HCC) 02/05/2009   Lymphedema of left lower extremity 02/05/2009    Allergies:  Allergies  Allergen Reactions   Bee Venom Anaphylaxis and Swelling   Septra [Sulfamethoxazole-Trimethoprim] Other (See Comments)    Vomit and loose stools   Other Itching, Rash and Other (See Comments)    Cannot tolerate any med that end in -CILLIN; also develops listers on her scalp   Penicillins Rash    Has patient had a PCN reaction causing immediate rash, facial/tongue/throat swelling, SOB or lightheadedness with hypotension:  yes Has patient had a PCN reaction causing severe rash involving mucus membranes or skin necrosis: Unknown Has patient had a PCN reaction that required hospitalization: Unknown Has patient had a PCN reaction occurring within the last 10 years: yes If all of the above answers are "NO", then may proceed with Cephalosporin use. Tolerated Cefepime and Rocephin in the past.   Vancomycin Itching and Rash   Medications:  Current Outpatient Medications:    neomycin-polymyxin-hydrocortisone (CORTISPORIN) OTIC solution, Place 3 drops into both ears 4 (four) times daily. X 7 days, Disp: 10 mL, Rfl: 0   ondansetron (ZOFRAN-ODT) 4 MG disintegrating tablet, Take 1 tablet (4 mg total) by mouth every 6 (six) hours as needed for nausea or vomiting., Disp: 20 tablet, Rfl: 0   oxyCODONE (OXY IR/ROXICODONE) 5 MG immediate release tablet, Take 1 tablet (5 mg total) by mouth every 6 (six) hours as needed for severe pain., Disp: 10 tablet, Rfl: 0   pantoprazole (PROTONIX) 40 MG tablet, Take 1 tablet (40 mg total) by mouth daily., Disp: 90 tablet, Rfl: 0  Observations/Objective: Patient is well-developed, well-nourished in no acute distress.  Resting comfortably  at home.  Head is normocephalic, atraumatic.  No labored breathing.  Speech is clear and coherent with logical content.  Patient is alert and oriented at baseline.    Assessment and Plan: 1. Acute low back pain without sciatica, unspecified back pain laterality (Primary)  2. History of C-section  3. Amenorrhea  Follow up with GYN. She will message [place] back and if preg test is neg I will send flexeril and naprosyn.   Follow Up Instructions: I discussed the assessment and treatment plan with the patient. The patient was provided an opportunity to ask questions and all were answered. The patient agreed with the plan and demonstrated an understanding of the instructions.  A copy of instructions were sent to the patient via MyChart unless otherwise  noted below.     The patient was advised to call back or seek an in-person evaluation if the symptoms worsen or if the condition fails to improve as anticipated.    Georgana Curio, FNP

## 2023-06-25 NOTE — Patient Instructions (Signed)
 Secondary Amenorrhea  Secondary amenorrhea occurs when a female who was previously having menstrual periods has not had them for 3-6 months. A menstrual period is the monthly shedding of the lining of the uterus. The lining of the uterus is made up of blood, tissue, fluid, and mucus. The flow of blood usually occurs during 3-7 consecutive days each month. This condition has many causes. In many cases, treating the underlying cause will return menstrual periods back to a normal cycle. What are the causes? The most common cause of this condition is pregnancy. Other medical conditions that can cause secondary amenorrhea include: Cirrhosis of the liver. Conditions of the blood. Diabetes. Epilepsy. Chronic kidney disease. Polycystic ovary disease. A hormonal imbalance. Ovarian failure. Cystic fibrosis. Early menopause. Cushing syndrome. Thyroid problems. Other causes may include: Malnutrition. Stress or anxiety. Medicines. Extreme obesity. Low body weight or drastic weight loss. Removal of the ovaries or uterus. Contraceptive pills, patches, or vaginal rings. What increases the risk? You are more likely to develop this condition if: You have a family history of this condition. You have an eating disorder. You do extreme athletic training. You have a chronic disease. You abuse substances such as alcohol or cigarettes. What are the signs or symptoms? The main symptom of this condition is a lack of menstrual periods for 3-6 months in a female who previously had menstrual periods. How is this diagnosed? This condition may be diagnosed based on: Your medical history. A physical exam. A pelvic exam to check for problems with your reproductive organs. A procedure to examine the uterus. A measurement of your body mass index (BMI). You may also have other tests, including: Blood tests that measure certain hormones in your body and rule out pregnancy. Urine tests. Imaging tests, such as  an ultrasound, CT scan, or MRI. How is this treated? Treatment for this condition depends on the cause of the amenorrhea. It may involve: Correcting diet-related problems. Treating underlying conditions. Medicines. Lifestyle changes. Surgery. If the condition cannot be corrected, it is sometimes possible to start menstrual periods with medicines. Follow these instructions at home: Lifestyle     Maintain a healthy diet. In general, a healthy diet includes lots of fruits and vegetables, low-fat dairy products, lean meats, and foods that contain fiber. Ask to meet with a registered dietitian for nutrition counseling and meal planning. Maintain a healthy weight. Talk to your health care provider before trying any new diet or exercise plan. Exercise at least 30 minutes 5 or more days each week. Exercising includes brisk walking, yard work, biking, running, swimming, and team sports like basketball and soccer. Ask your health care provider which exercises are safe for you. Get enough sleep. Plan your sleep time to allow for 7-9 hours of sleep each night. Learn to manage stress. Explore relaxation techniques such as meditation, journaling, yoga, or tai chi. General instructions Be aware of changes in your menstrual cycle. Keep a record of when you have your menstrual period. Note the date your period starts, how long it lasts, and any problems you experience. Take over-the-counter and prescription medicines only as told by your health care provider. Keep all follow-up visits. This is important. Contact a health care provider if: Your periods do not return to normal after treatment. Summary Secondary amenorrhea is when a female who was previously having menstrual periods has not gotten her period for 3-6 months. This condition has many causes. In many cases, treating the underlying cause will return menstrual periods back to a  normal cycle. Talk to your health care provider if your periods do not  return to normal after treatment. This information is not intended to replace advice given to you by your health care provider. Make sure you discuss any questions you have with your health care provider. Document Revised: 11/27/2019 Document Reviewed: 11/27/2019 Elsevier Patient Education  2024 [employer]Lumbosacral Strain A lumbosacral strain is an injury that causes pain in the lower back (lumbosacral spine). This injury usually happens from overstretching the muscles or ligaments along the spine. Ligaments are cord-like tissues that connect bones to each other. A strain can affect one or more muscles or ligaments. What are the causes? This condition may be caused by: A hard, direct hit to the back. Overstretching the lower back muscles. This may result from: A fall. Lifting something heavy. Repeated movements such as bending or crouching. What increases the risk? The following factors make you more likely to have a lumbosacral strain: Taking part in sports or activities that involve: A sudden twist of the back. Pushing or pulling motions. Being overweight or obese. Having poor strength and flexibility, especially tight hamstrings or weak muscles in the back or abdomen. Having too much of a curve in the lower back. Having a pelvis that is tilted forward. What are the signs or symptoms? The main symptom of this condition is pain in the lower back, at the site of the strain. Pain may also be felt down one or both legs. How is this diagnosed? This condition is diagnosed based on: Your symptoms and medical history. A physical exam. During the exam: Your health care provider may push on certain areas of your back to find the source of your pain. You may be asked to bend forward, backward, and side to side to check your pain and range of motion. You may also have imaging tests, such as X-rays and an MRI. How is this treated? This condition may be treated by: Applying heat and cold to  the affected area. Taking medicines for pain and to relax your muscles. Taking NSAIDs, such as ibuprofen, to help reduce swelling and discomfort. Doing stretching and strengthening exercises for your lower back. Symptoms usually improve within several weeks of treatment. But recovery time varies. When your symptoms improve, gradually return to your normal routine as soon as possible. This will help reduce pain, avoid stiffness, and keep muscle strength. Follow these instructions at home: Medicines Take over-the-counter and prescription medicines only as told by your health care provider. Ask your health care provider if the medicine prescribed to you: Requires you to avoid driving or using machinery. Can cause constipation. You may need to take these actions to prevent or treat constipation: Drink enough fluid to keep your urine pale yellow. Take over-the-counter or prescription medicines. Eat foods that are high in fiber, such as beans, whole grains, and fresh fruits and vegetables. Limit foods that are high in fat and processed sugars, such as fried or sweet foods. Managing pain, stiffness, and swelling     If told, put ice on the injured area. Put ice in a plastic bag. Place a towel between your skin and the bag. Leave the ice on for 20 minutes, 2-3 times a day. If told, apply heat to the affected area as often as told by your health care provider. Use the heat source that your health care provider recommends, such as a moist heat pack or a heating pad. Place a towel between your skin and the heat source.  Leave the heat on for 20-30 minutes. If your skin turns bright red, remove the heat or ice right away to prevent skin damage. The risk of damage is higher if you cannot feel pain, heat, or cold. Activity Rest as told by your health care provider. Do not stay in bed. Staying in bed for more than 1-2 days can delay your recovery. Return to your normal activities as told by your health  care provider. Ask your health care provider what activities are safe for you. Avoid activities that take a lot of energy for as long as told by your health care provider. Do exercises as told by your health care provider. This includes stretching and strengthening exercises. General instructions     Use good posture when sitting and standing. Avoid leaning forward when you sit, and avoid hunching over when you stand. Do not use any products that contain nicotine or tobacco. These products include cigarettes, chewing tobacco, and vaping devices, such as e-cigarettes. If you need help quitting, ask your health care provider. How is this prevented?  Warm up properly before physical activity, and cool down and stretch after being active. Use correct form when playing sports. Bend your knees and use correct posture when lifting heavy objects. Maintain a healthy weight. Sleep on a mattress with medium firmness to support your back. Do at least 150 minutes of moderate-intensity exercise each week, such as brisk walking or water aerobics. Try a form of exercise that takes stress off your back, such as swimming or stationary cycling. Maintain physical fitness, including: Strength. Flexibility. Contact a health care provider if: Your back pain does not improve after several weeks of treatment. Your symptoms get worse. You have a fever. Get help right away if: Your back pain is severe. You cannot stand or walk. You feel nauseous or you vomit. You develop any of the following: Trouble controlling when you urinate or when you have a bowel movement. Pain in your legs. Your feet or legs get very cold, turn pale, or look blue. Weakness in your buttocks or legs. This information is not intended to replace advice given to you by your health care provider. Make sure you discuss any questions you have with your health care provider. Document Revised: 08/15/2022 Document Reviewed: 11/02/2021 Elsevier  Patient Education  2024 [employer].

## 2023-06-28 ENCOUNTER — Telehealth: Admitting: Physician Assistant

## 2023-06-28 DIAGNOSIS — M545 Low back pain, unspecified: Secondary | ICD-10-CM | POA: Diagnosis not present

## 2023-06-28 DIAGNOSIS — M549 Dorsalgia, unspecified: Secondary | ICD-10-CM

## 2023-06-28 MED ORDER — CYCLOBENZAPRINE HCL 10 MG PO TABS: 5.0000 mg | ORAL_TABLET | Freq: Three times a day (TID) | ORAL | 0 refills | Status: DC | PRN

## 2023-06-28 MED ORDER — NAPROXEN 500 MG PO TABS: 500.0000 mg | ORAL_TABLET | Freq: Two times a day (BID) | ORAL | 0 refills | Status: DC

## 2023-06-28 NOTE — Progress Notes (Signed)
  Because of ongoing symptoms despite evaluation and treatment via video visit 3 days ago, and giving recent history, I feel your condition warrants further evaluation and I recommend that you be seen in a face-to-face visit.   NOTE: There will be NO CHARGE for this E-Visit   If you are having a true medical emergency, please call 911.     For an urgent face to face visit, [facility] has multiple urgent care centers for your convenience.  Click the link below for the full list of locations and hours, walk-in wait times, appointment scheduling options and driving directions:  Urgent Care - [place], [place], [place], [place], [place], [place]  [place]     Your MyChart E-visit questionnaire answers were reviewed by a board certified advanced clinical practitioner to complete your personal care plan based on your specific symptoms.    Thank you for using e-Visits.

## 2023-06-28 NOTE — Progress Notes (Signed)
 Virtual Visit Consent   [name], you are scheduled for a virtual visit with a [facility] provider today. Just as with appointments in the office, your consent must be obtained to participate. Your consent will be active for this visit and any virtual visit you may have with one of our providers in the next 365 days. If you have a MyChart account, a copy of this consent can be sent to you electronically.  As this is a virtual visit, video technology does not allow for your provider to perform a traditional examination. This may limit your provider's ability to fully assess your condition. If your provider identifies any concerns that need to be evaluated in person or the need to arrange testing (such as labs, EKG, etc.), we will make arrangements to do so. Although advances in technology are sophisticated, we cannot ensure that it will always work on either your end or our end. If the connection with a video visit is poor, the visit may have to be switched to a telephone visit. With either a video or telephone visit, we are not always able to ensure that we have a secure connection.  By engaging in this virtual visit, you consent to the provision of healthcare and authorize for your insurance to be billed (if applicable) for the services provided during this visit. Depending on your insurance coverage, you may receive a charge related to this service.  I need to obtain your verbal consent now. Are you willing to proceed with your visit today? DICKIE LABARRE has provided verbal consent on 06/28/2023 for a virtual visit (video or telephone). Margaretann Loveless, PA-C  Date: 06/28/2023 9:38 AM   Virtual Visit via Video Note   I, Margaretann Loveless, connected with  [name]  ([id number], [birth year]) on 06/28/23 at  9:30 AM EST by a video-enabled telemedicine application and verified that I am speaking with the correct person using two identifiers.  Location: Patient: Virtual  Visit Location Patient: Home Provider: Virtual Visit Location Provider: Home Office   I discussed the limitations of evaluation and management by telemedicine and the availability of in person appointments. The patient expressed understanding and agreed to proceed.    History of Present Illness: [name] is a 38 y.o. who identifies as a female who was assigned female at birth, and is being seen today for back pain.  HPI: Back Pain This is a new problem. The current episode started more than 1 month ago (off and on for 5 months; has had issues since having Epidural with C-section). The pain is present in the lumbar spine. The quality of the pain is described as cramping and shooting. The pain does not radiate. The symptoms are aggravated by position. Stiffness is present All day. Pertinent negatives include no abdominal pain, bladder incontinence, bowel incontinence, dysuria, fever, headaches, numbness, paresis, paresthesias, tingling or weakness. Risk factors include lack of exercise, poor posture and obesity (recent pregnancy requiring c-section and epidural). She has tried NSAIDs (ibuprofen, advil, tylenol, ASA, goody powder) for the symptoms. The treatment provided mild relief.    Seen virtually on 06/25/23, but medication was not prescribed awaiting for patient to confirm not pregnant.  Pregnancy test at home was negative. She is not breastfeeding.  Child is 65 months old.  Problems:  Patient Active Problem List   Diagnosis Date Noted   Morbid (severe) obesity due to excess calories (HCC) 04/05/2021   Type 2 diabetes mellitus with hyperglycemia, without long-term  current use of insulin (HCC) 09/30/2020   History of gestational hypertension    Sepsis (HCC) 10/21/2017   Left leg cellulitis 10/21/2017   Carpal tunnel syndrome on left 07/16/2017   Reduced vision 02/11/2015   PCO (polycystic ovaries) 01/15/2014   Heavy periods 01/08/2014   Metabolic syndrome X 05/12/2013    Hyperlipidemia LDL goal <100 02/13/2013   Prediabetes 02/13/2013   Anemia 11/08/2011   Eczema 03/31/2011   Depression 12/21/2010   Vitamin D deficiency 12/15/2010   Morbid obesity (HCC) 02/05/2009   Lymphedema of left lower extremity 02/05/2009    Allergies:  Allergies  Allergen Reactions   Bee Venom Anaphylaxis and Swelling   Septra [Sulfamethoxazole-Trimethoprim] Other (See Comments)    Vomit and loose stools   Other Itching, Rash and Other (See Comments)    Cannot tolerate any med that end in -CILLIN; also develops listers on her scalp   Penicillins Rash    Has patient had a PCN reaction causing immediate rash, facial/tongue/throat swelling, SOB or lightheadedness with hypotension: yes Has patient had a PCN reaction causing severe rash involving mucus membranes or skin necrosis: Unknown Has patient had a PCN reaction that required hospitalization: Unknown Has patient had a PCN reaction occurring within the last 10 years: yes If all of the above answers are "NO", then may proceed with Cephalosporin use. Tolerated Cefepime and Rocephin in the past.   Vancomycin Itching and Rash   Medications:  Current Outpatient Medications:    cyclobenzaprine (FLEXERIL) 10 MG tablet, Take 0.5-1 tablets (5-10 mg total) by mouth 3 (three) times daily as needed., Disp: 30 tablet, Rfl: 0   naproxen (NAPROSYN) 500 MG tablet, Take 1 tablet (500 mg total) by mouth 2 (two) times daily with a meal., Disp: 30 tablet, Rfl: 0   neomycin-polymyxin-hydrocortisone (CORTISPORIN) OTIC solution, Place 3 drops into both ears 4 (four) times daily. X 7 days, Disp: 10 mL, Rfl: 0   ondansetron (ZOFRAN-ODT) 4 MG disintegrating tablet, Take 1 tablet (4 mg total) by mouth every 6 (six) hours as needed for nausea or vomiting., Disp: 20 tablet, Rfl: 0   oxyCODONE (OXY IR/ROXICODONE) 5 MG immediate release tablet, Take 1 tablet (5 mg total) by mouth every 6 (six) hours as needed for severe pain., Disp: 10 tablet, Rfl: 0    pantoprazole (PROTONIX) 40 MG tablet, Take 1 tablet (40 mg total) by mouth daily., Disp: 90 tablet, Rfl: 0  Observations/Objective: Patient is well-developed, well-nourished in no acute distress.  Resting comfortably at home.  Head is normocephalic, atraumatic.  No labored breathing.  Speech is clear and coherent with logical content.  Patient is alert and oriented at baseline.    Assessment and Plan: 1. Acute bilateral low back pain without sciatica (Primary) - cyclobenzaprine (FLEXERIL) 10 MG tablet; Take 0.5-1 tablets (5-10 mg total) by mouth 3 (three) times daily as needed.  Dispense: 30 tablet; Refill: 0 - naproxen (NAPROSYN) 500 MG tablet; Take 1 tablet (500 mg total) by mouth 2 (two) times daily with a meal.  Dispense: 30 tablet; Refill: 0  - Suspect lumbar strain and spasm - Add Naproxen and flexeril - Tylenol if okay for breakthrough pain - Heat to area - Epsom salt soak if able to get in and out of bath tub safely - Back exercises and stretches provided via AVS - Seek in person evaluation if worsening or fails to improve with treatment   Follow Up Instructions: I discussed the assessment and treatment plan with the patient. The patient was  provided an opportunity to ask questions and all were answered. The patient agreed with the plan and demonstrated an understanding of the instructions.  A copy of instructions were sent to the patient via MyChart unless otherwise noted below.    The patient was advised to call back or seek an in-person evaluation if the symptoms worsen or if the condition fails to improve as anticipated.    Margaretann Loveless, PA-C

## 2023-06-28 NOTE — Patient Instructions (Signed)
 [name], thank you for joining Margaretann Loveless, PA-C for today's virtual visit.  While this provider is not your primary care provider (PCP), if your PCP is located in our provider database this encounter information will be shared with them immediately following your visit.   A [facility] MyChart account gives you access to today's visit and all your visits, tests, and labs performed at [facility] " click here if you don't have a [facility] MyChart account or go to [email or web address]  Consent: (Patient) [name] provided verbal consent for this virtual visit at the beginning of the encounter.  Current Medications:  Current Outpatient Medications:    cyclobenzaprine (FLEXERIL) 10 MG tablet, Take 0.5-1 tablets (5-10 mg total) by mouth 3 (three) times daily as needed., Disp: 30 tablet, Rfl: 0   naproxen (NAPROSYN) 500 MG tablet, Take 1 tablet (500 mg total) by mouth 2 (two) times daily with a meal., Disp: 30 tablet, Rfl: 0   neomycin-polymyxin-hydrocortisone (CORTISPORIN) OTIC solution, Place 3 drops into both ears 4 (four) times daily. X 7 days, Disp: 10 mL, Rfl: 0   ondansetron (ZOFRAN-ODT) 4 MG disintegrating tablet, Take 1 tablet (4 mg total) by mouth every 6 (six) hours as needed for nausea or vomiting., Disp: 20 tablet, Rfl: 0   oxyCODONE (OXY IR/ROXICODONE) 5 MG immediate release tablet, Take 1 tablet (5 mg total) by mouth every 6 (six) hours as needed for severe pain., Disp: 10 tablet, Rfl: 0   pantoprazole (PROTONIX) 40 MG tablet, Take 1 tablet (40 mg total) by mouth daily., Disp: 90 tablet, Rfl: 0   Medications ordered in this encounter:  Meds ordered this encounter  Medications   cyclobenzaprine (FLEXERIL) 10 MG tablet    Sig: Take 0.5-1 tablets (5-10 mg total) by mouth 3 (three) times daily as needed.    Dispense:  30 tablet    Refill:  0    Supervising Provider:   Merrilee Jansky [[id number]]   naproxen (NAPROSYN) 500 MG tablet     Sig: Take 1 tablet (500 mg total) by mouth 2 (two) times daily with a meal.    Dispense:  30 tablet    Refill:  0    Supervising Provider:   Merrilee Jansky [[id number]]     *If you need refills on other medications prior to your next appointment, please contact your pharmacy*  Follow-Up: Call back or seek an in-person evaluation if the symptoms worsen or if the condition fails to improve as anticipated.  [facility] [phone]  Other Instructions Back Exercises The following exercises strengthen the muscles that help to support the trunk (torso) and back. They also help to keep the lower back flexible. Doing these exercises can help to prevent or lessen existing low back pain. If you have back pain or discomfort, try doing these exercises 2-3 times each day or as told by your health care provider. As your pain improves, do them once each day, but increase the number of times that you repeat the steps for each exercise (do more repetitions). To prevent the recurrence of back pain, continue to do these exercises once each day or as told by your health care provider. Do exercises exactly as told by your health care provider and adjust them as directed. It is normal to feel mild stretching, pulling, tightness, or discomfort as you do these exercises, but you should stop right away if you feel sudden pain or your pain gets worse. Exercises  Single knee to chest Repeat these steps 3-5 times for each leg: Lie on your back on a firm bed or the floor with your legs extended. Bring one knee to your chest. Your other leg should stay extended and in contact with the floor. Hold your knee in place by grabbing your knee or thigh with both hands and hold. Pull on your knee until you feel a gentle stretch in your lower back or buttocks. Hold the stretch for 10-30 seconds. Slowly release and straighten your leg.  Pelvic tilt Repeat these steps 5-10 times: Lie on your back on a firm bed  or the floor with your legs extended. Bend your knees so they are pointing toward the ceiling and your feet are flat on the floor. Tighten your lower abdominal muscles to press your lower back against the floor. This motion will tilt your pelvis so your tailbone points up toward the ceiling instead of pointing to your feet or the floor. With gentle tension and even breathing, hold this position for 5-10 seconds.  Cat-cow Repeat these steps until your lower back becomes more flexible: Get into a hands-and-knees position on a firm bed or the floor. Keep your hands under your shoulders, and keep your knees under your hips. You may place padding under your knees for comfort. Let your head hang down toward your chest. Contract your abdominal muscles and point your tailbone toward the floor so your lower back becomes rounded like the back of a cat. Hold this position for 5 seconds. Slowly lift your head, let your abdominal muscles relax, and point your tailbone up toward the ceiling so your back forms a sagging arch like the back of a cow. Hold this position for 5 seconds.  Press-ups Repeat these steps 5-10 times: Lie on your abdomen (face-down) on a firm bed or the floor. Place your palms near your head, about shoulder-width apart. Keeping your back as relaxed as possible and keeping your hips on the floor, slowly straighten your arms to raise the top half of your body and lift your shoulders. Do not use your back muscles to raise your upper torso. You may adjust the placement of your hands to make yourself more comfortable. Hold this position for 5 seconds while you keep your back relaxed. Slowly return to lying flat on the floor.  Bridges Repeat these steps 10 times: Lie on your back on a firm bed or the floor. Bend your knees so they are pointing toward the ceiling and your feet are flat on the floor. Your arms should be flat at your sides, next to your body. Tighten your buttocks muscles and  lift your buttocks off the floor until your waist is at almost the same height as your knees. You should feel the muscles working in your buttocks and the back of your thighs. If you do not feel these muscles, slide your feet 1-2 inches (2.5-5 cm) farther away from your buttocks. Hold this position for 3-5 seconds. Slowly lower your hips to the starting position, and allow your buttocks muscles to relax completely. If this exercise is too easy, try doing it with your arms crossed over your chest. Abdominal crunches Repeat these steps 5-10 times: Lie on your back on a firm bed or the floor with your legs extended. Bend your knees so they are pointing toward the ceiling and your feet are flat on the floor. Cross your arms over your chest. Tip your chin slightly toward your chest without bending your  neck. Tighten your abdominal muscles and slowly raise your torso high enough to lift your shoulder blades a tiny bit off the floor. Avoid raising your torso higher than that because it can put too much stress on your lower back and does not help to strengthen your abdominal muscles. Slowly return to your starting position.  Back lifts Repeat these steps 5-10 times: Lie on your abdomen (face-down) with your arms at your sides, and rest your forehead on the floor. Tighten the muscles in your legs and your buttocks. Slowly lift your chest off the floor while you keep your hips pressed to the floor. Keep the back of your head in line with the curve in your back. Your eyes should be looking at the floor. Hold this position for 3-5 seconds. Slowly return to your starting position.  Contact a health care provider if: Your back pain or discomfort gets much worse when you do an exercise. Your worsening back pain or discomfort does not lessen within 2 hours after you exercise. If you have any of these problems, stop doing these exercises right away. Do not do them again unless your health care provider says  that you can. Get help right away if: You develop sudden, severe back pain. If this happens, stop doing the exercises right away. Do not do them again unless your health care provider says that you can. This information is not intended to replace advice given to you by your health care provider. Make sure you discuss any questions you have with your health care provider. Document Revised: 05/15/2022 Document Reviewed: 06/24/2020 Elsevier Patient Education  2024 Elsevier Inc.   If you have been instructed to have an in-person evaluation today at a local Urgent Care facility, please use the link below. It will take you to a list of all of our available [facility], including address, phone number and hours of operation. Please do not delay care.  [facility]  If you or a family member do not have a primary care provider, use the link below to schedule a visit and establish care. When you choose a [facility] primary care physician or advanced practice provider, you gain a long-term partner in health. Find a Primary Care Provider  Learn more about [facility] in-office and virtual care options: [facility] - Get Care Now

## 2023-08-07 ENCOUNTER — Telehealth: Admitting: Nurse Practitioner

## 2023-08-07 DIAGNOSIS — R635 Abnormal weight gain: Secondary | ICD-10-CM | POA: Diagnosis not present

## 2023-08-07 NOTE — Progress Notes (Signed)
 Virtual Visit Consent   [name], you are scheduled for a virtual visit with a [facility] provider today. Just as with appointments in the office, your consent must be obtained to participate. Your consent will be active for this visit and any virtual visit you may have with one of our providers in the next 365 days. If you have a MyChart account, a copy of this consent can be sent to you electronically.  As this is a virtual visit, video technology does not allow for your provider to perform a traditional examination. This may limit your provider's ability to fully assess your condition. If your provider identifies any concerns that need to be evaluated in person or the need to arrange testing (such as labs, EKG, etc.), we will make arrangements to do so. Although advances in technology are sophisticated, we cannot ensure that it will always work on either your end or our end. If the connection with a video visit is poor, the visit may have to be switched to a telephone visit. With either a video or telephone visit, we are not always able to ensure that we have a secure connection.  By engaging in this virtual visit, you consent to the provision of healthcare and authorize for your insurance to be billed (if applicable) for the services provided during this visit. Depending on your insurance coverage, you may receive a charge related to this service.  I need to obtain your verbal consent now. Are you willing to proceed with your visit today? [name] has provided verbal consent on 08/07/2023 for a virtual visit (video or telephone). Viviano Simas, FNP  Date: 08/07/2023 3:31 PM   Virtual Visit via Video Note   I, Viviano Simas, connected with  [name]  ([id number], 1985-06-23) on 08/07/23 at  3:45 PM EDT by a video-enabled telemedicine application and verified that I am speaking with the correct person using two identifiers.  Location: Patient: Virtual Visit Location  Patient: Home Provider: Virtual Visit Location Provider: Home Office   I discussed the limitations of evaluation and management by telemedicine and the availability of in person appointments. The patient expressed understanding and agreed to proceed.    History of Present Illness: [name] is a 38 y.o. 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   She is not currently seeing a primary care provider - her prior PCP deceased  In the past year she was being followed by OBGYN   She was previously diabatic but that was reversed after her gastric bypass (2022)  She currently weighs 300lb was down to 200lbs prior to pregnancy   Has noted an increase in her appetite  She is not breastfeeding (stopped two months)   Does have home BP and glucose kits but has not been using them  Interested in appetite suppressant and weight loss   Problems:  Patient Active Problem List   Diagnosis Date Noted   Morbid (severe) obesity due to excess calories (HCC) 04/05/2021   Type 2 diabetes mellitus with hyperglycemia, without long-term current use of insulin (HCC) 09/30/2020   History of gestational hypertension    Sepsis (HCC) 10/21/2017   Left leg cellulitis 10/21/2017   Carpal tunnel syndrome on left 07/16/2017   Reduced vision 02/11/2015   PCO (polycystic  ovaries) 01/15/2014   Heavy periods 01/08/2014   Metabolic syndrome X 05/12/2013   Hyperlipidemia LDL goal <100 02/13/2013   Prediabetes 02/13/2013   Anemia 11/08/2011   Eczema 03/31/2011   Depression 12/21/2010   Vitamin D deficiency 12/15/2010   Morbid obesity (HCC) 02/05/2009   Lymphedema of left lower extremity 02/05/2009    Allergies:  Allergies  Allergen Reactions   Bee Venom Anaphylaxis and Swelling   Septra [Sulfamethoxazole-Trimethoprim] Other (See  Comments)    Vomit and loose stools   Other Itching, Rash and Other (See Comments)    Cannot tolerate any med that end in -CILLIN; also develops listers on her scalp   Penicillins Rash    Has patient had a PCN reaction causing immediate rash, facial/tongue/throat swelling, SOB or lightheadedness with hypotension: yes Has patient had a PCN reaction causing severe rash involving mucus membranes or skin necrosis: Unknown Has patient had a PCN reaction that required hospitalization: Unknown Has patient had a PCN reaction occurring within the last 10 years: yes If all of the above answers are "NO", then may proceed with Cephalosporin use. Tolerated Cefepime and Rocephin in the past.   Vancomycin Itching and Rash   Medications:  Current Outpatient Medications:    cyclobenzaprine (FLEXERIL) 10 MG tablet, Take 0.5-1 tablets (5-10 mg total) by mouth 3 (three) times daily as needed., Disp: 30 tablet, Rfl: 0   naproxen (NAPROSYN) 500 MG tablet, Take 1 tablet (500 mg total) by mouth 2 (two) times daily with a meal., Disp: 30 tablet, Rfl: 0   neomycin-polymyxin-hydrocortisone (CORTISPORIN) OTIC solution, Place 3 drops into both ears 4 (four) times daily. X 7 days, Disp: 10 mL, Rfl: 0   ondansetron (ZOFRAN-ODT) 4 MG disintegrating tablet, Take 1 tablet (4 mg total) by mouth every 6 (six) hours as needed for nausea or vomiting., Disp: 20 tablet, Rfl: 0   oxyCODONE (OXY IR/ROXICODONE) 5 MG immediate release tablet, Take 1 tablet (5 mg total) by mouth every 6 (six) hours as needed for severe pain., Disp: 10 tablet, Rfl: 0   pantoprazole (PROTONIX) 40 MG tablet, Take 1 tablet (40 mg total) by mouth daily., Disp: 90 tablet, Rfl: 0  Observations/Objective: Patient is well-developed, well-nourished in no acute distress.  Resting comfortably  at home.  Head is normocephalic, atraumatic.  No labored breathing.  Speech is clear and coherent with logical content.  Patient is alert and oriented at baseline.     Assessment and Plan:  1. Weight gain  Advised tracking glucose and blood pressures over the next month  Discussed hydration and set up new patient appointment with PCP for next month   With any acute needs she may follow up with [facility] as needed while awaiting new appointment with PCP   Follow Up Instructions: I discussed the assessment and treatment plan with the patient. The patient was provided an opportunity to ask questions and all were answered. The patient agreed with the plan and demonstrated an understanding of the instructions.  A copy of instructions were sent to the patient via MyChart unless otherwise noted below.    The patient was advised to call back or seek an in-person evaluation if the symptoms worsen or if the condition fails to improve as anticipated.    Viviano Simas, FNP

## 2023-08-07 NOTE — Patient Instructions (Signed)
 New Patient Visit with Anton Baton Thursday Sep 14, 2023 10:15 AM EDT (30 minutes) Add to calendar [facility] [address] [place] [address] [phone]?

## 2023-08-18 ENCOUNTER — Encounter (HOSPITAL_COMMUNITY): Payer: Self-pay

## 2023-08-23 ENCOUNTER — Inpatient Hospital Stay (HOSPITAL_COMMUNITY): Admission: RE | Admit: 2023-08-23 | Source: Ambulatory Visit

## 2023-08-23 DIAGNOSIS — Z1231 Encounter for screening mammogram for malignant neoplasm of breast: Secondary | ICD-10-CM

## 2023-08-30 ENCOUNTER — Ambulatory Visit: Admitting: Nurse Practitioner

## 2023-08-30 ENCOUNTER — Encounter: Payer: Self-pay | Admitting: Nurse Practitioner

## 2023-08-30 VITALS — BP 120/78 | HR 90 | Temp 97.9°F | Resp 16 | Ht 69.0 in | Wt 308.8 lb

## 2023-08-30 DIAGNOSIS — E559 Vitamin D deficiency, unspecified: Secondary | ICD-10-CM

## 2023-08-30 DIAGNOSIS — E1165 Type 2 diabetes mellitus with hyperglycemia: Secondary | ICD-10-CM

## 2023-08-30 DIAGNOSIS — L680 Hirsutism: Secondary | ICD-10-CM | POA: Diagnosis not present

## 2023-08-30 DIAGNOSIS — R7303 Prediabetes: Secondary | ICD-10-CM | POA: Diagnosis not present

## 2023-08-30 DIAGNOSIS — F330 Major depressive disorder, recurrent, mild: Secondary | ICD-10-CM

## 2023-08-30 DIAGNOSIS — F411 Generalized anxiety disorder: Secondary | ICD-10-CM

## 2023-08-30 DIAGNOSIS — D509 Iron deficiency anemia, unspecified: Secondary | ICD-10-CM

## 2023-08-30 DIAGNOSIS — Z0001 Encounter for general adult medical examination with abnormal findings: Secondary | ICD-10-CM | POA: Diagnosis not present

## 2023-08-30 DIAGNOSIS — Z8759 Personal history of other complications of pregnancy, childbirth and the puerperium: Secondary | ICD-10-CM

## 2023-08-30 LAB — LIPID PANEL

## 2023-08-30 MED ORDER — SPIRONOLACTONE 25 MG PO TABS: 25.0000 mg | ORAL_TABLET | Freq: Every day | ORAL | 0 refills | Status: DC

## 2023-08-30 MED ORDER — PANTOPRAZOLE SODIUM 40 MG PO TBEC: 40.0000 mg | DELAYED_RELEASE_TABLET | Freq: Every day | ORAL | 0 refills | Status: DC

## 2023-08-30 MED ORDER — ESCITALOPRAM OXALATE 10 MG PO TABS: 10.0000 mg | ORAL_TABLET | Freq: Every day | ORAL | 1 refills | Status: DC
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
## 2023-08-31 ENCOUNTER — Other Ambulatory Visit: Payer: Self-pay | Admitting: Nurse Practitioner

## 2023-08-31 DIAGNOSIS — D509 Iron deficiency anemia, unspecified: Secondary | ICD-10-CM

## 2023-08-31 DIAGNOSIS — E559 Vitamin D deficiency, unspecified: Secondary | ICD-10-CM

## 2023-08-31 LAB — COMPREHENSIVE METABOLIC PANEL WITH GFR
ALT: 13 IU/L (ref 0–32)
AST: 16 IU/L (ref 0–40)
Albumin: 4 g/dL (ref 3.9–4.9)
Alkaline Phosphatase: 115 IU/L (ref 44–121)
BUN/Creatinine Ratio: 16 (ref 9–23)
BUN: 13 mg/dL (ref 6–24)
CO2: 20 mmol/L (ref 20–29)
Calcium: 8.9 mg/dL (ref 8.7–10.2)
Chloride: 103 mmol/L (ref 96–106)
Creatinine, Ser: 0.82 mg/dL (ref 0.57–1.00)
Globulin, Total: 3.1 g/dL (ref 1.5–4.5)
Glucose: 88 mg/dL (ref 70–99)
Potassium: 4.7 mmol/L (ref 3.5–5.2)
Sodium: 138 mmol/L (ref 134–144)
Total Protein: 7.1 g/dL (ref 6.0–8.5)
eGFR: 93 mL/min/{1.73_m2} (ref >59)

## 2023-08-31 LAB — CBC WITH DIFFERENTIAL/PLATELET
Basophils Absolute: 0 10*3/uL (ref 0.0–0.2)
Basos: 0 %
EOS (ABSOLUTE): 0.1 10*3/uL (ref 0.0–0.4)
Eos: 1 %
Hematocrit: 37 % (ref 34.0–46.6)
Hemoglobin: 12 g/dL (ref 11.1–15.9)
Immature Grans (Abs): 0 10*3/uL (ref 0.0–0.1)
Immature Granulocytes: 0 %
Lymphocytes Absolute: 4 10*3/uL — ABNORMAL HIGH (ref 0.7–3.1)
Lymphs: 38 %
MCH: 25.1 pg — ABNORMAL LOW (ref 26.6–33.0)
MCHC: 32.4 g/dL (ref 31.5–35.7)
MCV: 77 fL — ABNORMAL LOW (ref 79–97)
Monocytes Absolute: 0.6 10*3/uL (ref 0.1–0.9)
Monocytes: 6 %
Neutrophils Absolute: 5.7 10*3/uL (ref 1.4–7.0)
Neutrophils: 55 %
Platelets: 294 10*3/uL (ref 150–450)
RBC: 4.78 x10E6/uL (ref 3.77–5.28)
RDW: 14.4 % (ref 11.7–15.4)
WBC: 10.4 10*3/uL (ref 3.4–10.8)

## 2023-08-31 LAB — VITAMIN D 25 HYDROXY (VIT D DEFICIENCY, FRACTURES): Vit D, 25-Hydroxy: 14.9 ng/mL — ABNORMAL LOW (ref 30.0–100.0)

## 2023-08-31 LAB — LIPID PANEL
Cholesterol, Total: 229 mg/dL — ABNORMAL HIGH (ref 100–199)
HDL: 41 mg/dL (ref >39)
LDL CALC COMMENT:: 5.6 ratio — ABNORMAL HIGH (ref 0.0–4.4)
LDL Chol Calc (NIH): 133 mg/dL — ABNORMAL HIGH (ref 0–99)
Triglycerides: 306 mg/dL — ABNORMAL HIGH (ref 0–149)
VLDL Cholesterol Cal: 55 mg/dL — ABNORMAL HIGH (ref 5–40)

## 2023-08-31 LAB — BAYER DCA HB A1C WAIVED: HB A1C (BAYER DCA - WAIVED): 5.4 % (ref 4.8–5.6)

## 2023-08-31 LAB — THYROID PANEL
Free Thyroxine Index: 1.8 (ref 1.2–4.9)
T3 Uptake Ratio: 25 % (ref 24–39)
T4, Total: 7.3 ug/dL (ref 4.5–12.0)

## 2023-08-31 MED ORDER — FERROUS SULFATE 325 (65 FE) MG PO TABS: 325.0000 mg | ORAL_TABLET | Freq: Every day | ORAL | 0 refills | Status: DC

## 2023-08-31 MED ORDER — VITAMIN D (ERGOCALCIFEROL) 1.25 MG (50000 UNIT) PO CAPS: 50000.0000 [IU] | ORAL_CAPSULE | ORAL | 0 refills | Status: DC

## 2023-09-01 LAB — MICROALBUMIN / CREATININE URINE RATIO
Creatinine, Urine: 118.5 mg/dL
Microalb/Creat Ratio: <3 mg/g{creat} (ref 0–29)
Microalbumin, Urine: <3 ug/mL

## 2023-09-14 ENCOUNTER — Ambulatory Visit: Admitting: Nurse Practitioner

## 2023-09-25 ENCOUNTER — Telehealth: Payer: Self-pay | Admitting: Nurse Practitioner

## 2023-09-25 NOTE — Telephone Encounter (Signed)
 Spoke with patient to schedule AWV.  She asked if her escitalopram  (LEXAPRO ) 10 MG tablet could be increased.  [facility] [place], [place].  Thank you,  Trevor Fudge,  AMB Clinical Support [facility] AWV Program Direct Dial ??[phone]

## 2023-09-25 NOTE — Telephone Encounter (Signed)
 Informed pt [name] is off for the next couple weeks and will review her message when she returns. Pt verbalized understanding

## 2023-09-26 ENCOUNTER — Other Ambulatory Visit: Payer: Self-pay | Admitting: *Deleted

## 2023-09-26 DIAGNOSIS — L680 Hirsutism: Secondary | ICD-10-CM

## 2023-09-26 DIAGNOSIS — F330 Major depressive disorder, recurrent, mild: Secondary | ICD-10-CM

## 2023-09-26 DIAGNOSIS — F411 Generalized anxiety disorder: Secondary | ICD-10-CM

## 2023-09-26 MED ORDER — ESCITALOPRAM OXALATE 10 MG PO TABS: 10.0000 mg | ORAL_TABLET | Freq: Every day | ORAL | 0 refills | Status: DC

## 2023-09-26 MED ORDER — SPIRONOLACTONE 25 MG PO TABS: 25.0000 mg | ORAL_TABLET | Freq: Every day | ORAL | 0 refills | Status: DC

## 2023-09-26 MED ORDER — PANTOPRAZOLE SODIUM 40 MG PO TBEC
40.0000 mg | DELAYED_RELEASE_TABLET | Freq: Every day | ORAL | 0 refills | Status: DC
Start: 2023-09-26 — End: 2023-11-28

## 2023-10-09 ENCOUNTER — Telehealth (INDEPENDENT_AMBULATORY_CARE_PROVIDER_SITE_OTHER): Admitting: Nurse Practitioner

## 2023-10-09 ENCOUNTER — Encounter: Payer: Self-pay | Admitting: Nurse Practitioner

## 2023-10-09 ENCOUNTER — Other Ambulatory Visit (HOSPITAL_COMMUNITY): Payer: Self-pay

## 2023-10-09 DIAGNOSIS — F411 Generalized anxiety disorder: Secondary | ICD-10-CM

## 2023-10-09 DIAGNOSIS — F330 Major depressive disorder, recurrent, mild: Secondary | ICD-10-CM | POA: Diagnosis not present

## 2023-10-09 MED ORDER — ESCITALOPRAM OXALATE 20 MG PO TABS
20.0000 mg | ORAL_TABLET | Freq: Every day | ORAL | 0 refills | Status: DC
  Filled 2023-10-09: qty 90, 90d supply, fill #0
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
## 2023-10-10 ENCOUNTER — Other Ambulatory Visit: Payer: Self-pay

## 2023-10-10 ENCOUNTER — Ambulatory Visit: Admitting: Nurse Practitioner

## 2023-10-10 DIAGNOSIS — F411 Generalized anxiety disorder: Secondary | ICD-10-CM

## 2023-10-10 DIAGNOSIS — F330 Major depressive disorder, recurrent, mild: Secondary | ICD-10-CM

## 2023-10-10 MED ORDER — ESCITALOPRAM OXALATE 20 MG PO TABS: 20.0000 mg | ORAL_TABLET | Freq: Every day | ORAL | 0 refills | Status: DC

## 2023-10-11 ENCOUNTER — Other Ambulatory Visit (HOSPITAL_COMMUNITY): Payer: Self-pay

## 2023-10-11 ENCOUNTER — Ambulatory Visit: Admitting: Nurse Practitioner

## 2023-10-24 ENCOUNTER — Ambulatory Visit

## 2023-11-02 ENCOUNTER — Telehealth: Payer: Self-pay

## 2023-11-02 NOTE — Telephone Encounter (Signed)
 Copied from CRM [id number]. Topic: General - Other >> Nov 02, 2023 10:37 AM Myrick T wrote: Reason for CRM: patient called to get her image results. Please f/u with patient

## 2023-11-02 NOTE — Telephone Encounter (Signed)
 I spoke to pt and she states she had a missed call but I don't see any open encounters where we tried to call pt but pt did need 6 month follow up per last set of labs in May. Scheduled pt 03/04/24 at 8 with PCP.

## 2023-11-24 ENCOUNTER — Other Ambulatory Visit: Payer: Self-pay | Admitting: Medical Genetics

## 2023-11-27 ENCOUNTER — Other Ambulatory Visit (HOSPITAL_COMMUNITY)

## 2023-11-28 ENCOUNTER — Other Ambulatory Visit: Payer: Self-pay | Admitting: *Deleted

## 2023-11-28 DIAGNOSIS — L680 Hirsutism: Secondary | ICD-10-CM

## 2023-11-28 DIAGNOSIS — M545 Low back pain, unspecified: Secondary | ICD-10-CM

## 2023-11-28 DIAGNOSIS — F330 Major depressive disorder, recurrent, mild: Secondary | ICD-10-CM

## 2023-11-28 DIAGNOSIS — F411 Generalized anxiety disorder: Secondary | ICD-10-CM

## 2023-11-28 MED ORDER — PANTOPRAZOLE SODIUM 40 MG PO TBEC: 40.0000 mg | DELAYED_RELEASE_TABLET | Freq: Every day | ORAL | 0 refills | Status: DC

## 2023-11-28 MED ORDER — ESCITALOPRAM OXALATE 20 MG PO TABS: 20.0000 mg | ORAL_TABLET | Freq: Every day | ORAL | 0 refills | Status: DC

## 2023-11-28 MED ORDER — SPIRONOLACTONE 25 MG PO TABS
25.0000 mg | ORAL_TABLET | Freq: Every day | ORAL | 0 refills | Status: DC
Start: 2023-11-28 — End: 2024-01-24

## 2023-11-28 MED ORDER — CYCLOBENZAPRINE HCL 10 MG PO TABS: 5.0000 mg | ORAL_TABLET | Freq: Three times a day (TID) | ORAL | 0 refills | Status: DC | PRN

## 2023-12-06 ENCOUNTER — Other Ambulatory Visit: Payer: Self-pay

## 2023-12-06 ENCOUNTER — Other Ambulatory Visit: Payer: Self-pay | Admitting: Nurse Practitioner

## 2023-12-06 ENCOUNTER — Encounter (HOSPITAL_COMMUNITY): Payer: Self-pay

## 2023-12-06 ENCOUNTER — Emergency Department (HOSPITAL_COMMUNITY)
Admission: EM | Admit: 2023-12-06 | Discharge: 2023-12-06 | Disposition: A | Discharge status: Home / Self Care | Attending: Emergency Medicine | Admitting: Emergency Medicine

## 2023-12-06 DIAGNOSIS — F411 Generalized anxiety disorder: Secondary | ICD-10-CM

## 2023-12-06 DIAGNOSIS — R6883 Chills (without fever): Secondary | ICD-10-CM | POA: Insufficient documentation

## 2023-12-06 DIAGNOSIS — E119 Type 2 diabetes mellitus without complications: Secondary | ICD-10-CM | POA: Diagnosis not present

## 2023-12-06 DIAGNOSIS — F330 Major depressive disorder, recurrent, mild: Secondary | ICD-10-CM

## 2023-12-06 LAB — CBC WITH DIFFERENTIAL/PLATELET
Abs Immature Granulocytes: 0.01 K/uL (ref 0.00–0.07)
Basophils Absolute: 0 K/uL (ref 0.0–0.1)
Basophils Relative: 0 %
Eosinophils Absolute: 0.1 K/uL (ref 0.0–0.5)
Eosinophils Relative: 1 %
HCT: 36.7 % (ref 36.0–46.0)
Hemoglobin: 11.4 g/dL — ABNORMAL LOW (ref 12.0–15.0)
Immature Granulocytes: 0 %
Lymphocytes Relative: 32 %
Lymphs Abs: 2.7 K/uL (ref 0.7–4.0)
MCH: 24.5 pg — ABNORMAL LOW (ref 26.0–34.0)
MCHC: 31.1 g/dL (ref 30.0–36.0)
MCV: 78.8 fL — ABNORMAL LOW (ref 80.0–100.0)
Monocytes Absolute: 0.4 K/uL (ref 0.1–1.0)
Monocytes Relative: 5 %
Neutro Abs: 5.4 K/uL (ref 1.7–7.7)
Neutrophils Relative %: 62 %
Platelets: 282 K/uL (ref 150–400)
RBC: 4.66 MIL/uL (ref 3.87–5.11)
RDW: 15.7 % — ABNORMAL HIGH (ref 11.5–15.5)
WBC: 8.6 K/uL (ref 4.0–10.5)
nRBC: 0 % (ref 0.0–0.2)

## 2023-12-06 LAB — COMPREHENSIVE METABOLIC PANEL WITH GFR
ALT: 15 U/L (ref 0–44)
AST: 18 U/L (ref 15–41)
Albumin: 3.4 g/dL — ABNORMAL LOW (ref 3.5–5.0)
Alkaline Phosphatase: 77 U/L (ref 38–126)
Anion gap: 8 (ref 5–15)
BUN: 9 mg/dL (ref 6–20)
CO2: 25 mmol/L (ref 22–32)
Calcium: 8.4 mg/dL — ABNORMAL LOW (ref 8.9–10.3)
Chloride: 106 mmol/L (ref 98–111)
Creatinine, Ser: 0.87 mg/dL (ref 0.44–1.00)
GFR, Estimated: >60 mL/min (ref >60)
Glucose, Bld: 99 mg/dL (ref 70–99)
Potassium: 4 mmol/L (ref 3.5–5.1)
Sodium: 139 mmol/L (ref 135–145)
Total Bilirubin: 0.4 mg/dL (ref 0.0–1.2)
Total Protein: 6.8 g/dL (ref 6.5–8.1)

## 2023-12-06 NOTE — Discharge Instructions (Signed)
 Continue to monitor for symptoms of cellulitis or infection such as fever, headache, chills, redness to skin, pain in extremity.  Return to ED if you have any more concerns for infection.

## 2023-12-06 NOTE — ED Provider Notes (Signed)
 [facility] EMERGENCY DEPARTMENT AT [facility] Provider Note   CSN: [id number] Arrival date & time: 12/06/23  2023     Patient presents with: Chills   [name] is a 39 y.o. female.  38 year old female presents to the ED with, concern for cellulitis.  Patient has history of lymphedema in the left lower extremity and has had cellulitis before.  Patient advised last time she had cellulitis she woke up with chills and was treated.  This morning she woke up with chills but they resolved shortly after.  She has had normal p.o. intake of water and food today with no nausea or vomiting.  Patient denies any diarrhea or constipation.  Patient denies any chest pain or shortness of breath.  No noted fevers at home or recent sicknesses or sick contacts.  Patient denies any wounds to the lower extremity either.     Prior to Admission medications   Medication Sig Start Date End Date Taking? Authorizing Provider  cyclobenzaprine  (FLEXERIL ) 10 MG tablet Take 0.5-1 tablets (5-10 mg total) by mouth 3 (three) times daily as needed. 11/28/23   [facility] Sebastian Pool, NP  escitalopram  (LEXAPRO ) 20 MG tablet Take 1 tablet (20 mg total) by mouth daily. 11/28/23   [facility] Sebastian Pool, NP  ferrous sulfate  325 (65 FE) MG tablet Take 1 tablet (325 mg total) by mouth daily with breakfast. 08/31/23   Deitra Morton Sebastian Pool, NP  Multiple Vitamin (MULTIVITAMIN) tablet Take 1 tablet by mouth daily.    [provider]  naproxen  (NAPROSYN ) 500 MG tablet Take 1 tablet (500 mg total) by mouth 2 (two) times daily with a meal. 06/28/23   Burnette, Delon HERO, PA-C  pantoprazole  (PROTONIX ) 40 MG tablet Take 1 tablet (40 mg total) by mouth daily. 11/28/23   [facility] Sebastian Pool, NP  spironolactone  (ALDACTONE ) 25 MG tablet Take 1 tablet (25 mg total) by mouth daily. 11/28/23   [place] Thompson, Sandra, NP  Vitamin D , Ergocalciferol , (DRISDOL ) 1.25 MG (50000 UNIT) CAPS capsule Take 1 capsule  (50,000 Units total) by mouth every 7 (seven) days. 08/31/23   [facility] Sebastian Pool, NP    Allergies: Bee venom, Septra [sulfamethoxazole -trimethoprim], Other, Penicillins, and Vancomycin     Review of Systems  Updated Vital Signs BP 117/84 (BP Location: Right Wrist)   Pulse 82   Temp 98.3 F (36.8 C) (Oral)   Resp 18   Ht 5' 9 (1.753 m)   Wt 136.1 kg   SpO2 99%   BMI 44.30 kg/m   Physical Exam  (all labs ordered are listed, but only abnormal results are displayed) Labs Reviewed  CBC WITH DIFFERENTIAL/PLATELET - Abnormal; Notable for the following components:      Result Value   Hemoglobin 11.4 (*)    MCV 78.8 (*)    MCH 24.5 (*)    RDW 15.7 (*)    All other components within normal limits  COMPREHENSIVE METABOLIC PANEL WITH GFR - Abnormal; Notable for the following components:   Calcium 8.4 (*)    Albumin 3.4 (*)    All other components within normal limits    EKG: None  Radiology: No results found.   Procedures   Medications Ordered in the ED - No data to display   Medical Decision Making Amount and/or Complexity of Data Reviewed Labs: ordered.   38 y.o. female presents to the ED for concern of Chills     This involves an extensive number of treatment options,  and is a complaint that carries with it a high risk of complications and morbidity.  The differential diagnosis prior to evaluation includes, but is not limited to: Cellulitis, wound, lymphedema  This is not an exhaustive differential.   Past Medical History / Co-morbidities / Social History: Hx of lymphedema, anemia, hyperlipidemia, type 2 diabetes, Social Determinants of Health include: Former tobacco smoker no current alcohol or drug use.  Additional History:  Obtained by chart review.  Notably family medicine close follow-ups for lymphedema and type 2 diabetes hyperlipidemia.  History of cellulitis  Lab Tests: I ordered, and personally interpreted labs.  The pertinent results include:  CMP and CBC with no acute abnormalities.   ED Course / Critical Interventions: Pt well-appearing on exam.  Sitting comfortable in ED bed.  Patient is concerned for cellulitis because she had chills last time when she had cellulitis.  On exam notable lymphedema in the left lower extremity.  No obvious wounds on the shin posterior and anterior, ankle, or any of the toes.  Patient denies any wounds or injuries recently to the lower extremity.  There is no warmth, erythema, or pain to palpation throughout entire lower extremity.  Patient advised she had no fevers, chest pain, shortness of breath, headache, dizziness, weakness throughout the day and normal p.o. intake.  Upon reevaluation, patient was advised all labs do not suggest any infectious processes and there is no evidence of cellulitis on exam.  Patient was advised symptoms to monitor for cellulitis or infections.  Family requested prophylactic antibiotics and it was advised not to treat if there is no evidence of infection.  Patient agreed and felt comfortable with discharge. I have reviewed the patients home medicines and have made adjustments as needed.  Disposition: Considered admission and after reviewing the patient's encounter today, I feel that the patient would benefit from discharge and monitor at home.  Discussed course of treatment with the patient, whom demonstrated understanding.  Patient in agreement and has no further questions.    I discussed this case with my attending, Dr. Cleotilde, who agreed with the proposed treatment course and cosigned this note including patient's presenting symptoms, physical exam, and planned diagnostics and interventions.  Attending physician stated agreement with plan or made changes to plan which were implemented.     This chart was dictated using voice recognition software.  Despite best efforts to proofread, errors can occur which can change the documentation meaning.   Final diagnoses:  Chills     ED Discharge Orders     None          [name] 12/06/23 2328    Cleotilde Rogue, MD 12/07/23 1314

## 2023-12-06 NOTE — ED Triage Notes (Signed)
 Pt reports she has lymph edema and is concerned she may have cellulitis because she was having chills and feeling light headed.  Pt does not have an area of skin with redness or pain at this time, but reports these symptoms are usually what happens when she does.

## 2023-12-08 ENCOUNTER — Other Ambulatory Visit: Payer: Self-pay | Admitting: Nurse Practitioner

## 2023-12-08 DIAGNOSIS — F411 Generalized anxiety disorder: Secondary | ICD-10-CM

## 2023-12-08 DIAGNOSIS — F330 Major depressive disorder, recurrent, mild: Secondary | ICD-10-CM

## 2023-12-08 MED ORDER — ESCITALOPRAM OXALATE 20 MG PO TABS
20.0000 mg | ORAL_TABLET | Freq: Every day | ORAL | 0 refills | Status: DC
Start: 2023-12-08 — End: 2024-02-12

## 2023-12-08 NOTE — Telephone Encounter (Signed)
 Copied from CRM [id number]. Topic: Clinical - Medication Refill >> Dec 08, 2023  9:54 AM Montie POUR wrote: Medication: escitalopram  (LEXAPRO ) 20 MG tablet  Has the patient contacted their pharmacy? Yes (Agent: If no, request that the patient contact the pharmacy for the refill. If patient does not wish to contact the pharmacy document the reason why and proceed with request.) (Agent: If yes, when and what did the pharmacy advise?) Pharmacy needs order to refill  This is the patient's preferred pharmacy:  [facility] [address], [place] - [address] [place] [address] Phone: [phone] Fax: [phone]  Is this the correct pharmacy for this prescription? Yes If no, delete pharmacy and type the correct one.   Has the prescription been filled recently? No  Is the patient out of the medication? Yes  Has the patient been seen for an appointment in the last year OR does the patient have an upcoming appointment? Yes  Can we respond through MyChart? Yes  Agent: Please be advised that Rx refills may take up to 3 business days. We ask that you follow-up with your pharmacy.
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
## 2024-01-08 ENCOUNTER — Emergency Department (HOSPITAL_COMMUNITY)
Admission: EM | Admit: 2024-01-08 | Discharge: 2024-01-08 | Disposition: A | Discharge status: Home / Self Care | Attending: Emergency Medicine | Admitting: Emergency Medicine

## 2024-01-08 ENCOUNTER — Ambulatory Visit

## 2024-01-08 ENCOUNTER — Encounter (HOSPITAL_COMMUNITY): Payer: Self-pay | Admitting: Emergency Medicine

## 2024-01-08 ENCOUNTER — Other Ambulatory Visit: Payer: Self-pay

## 2024-01-08 DIAGNOSIS — M549 Dorsalgia, unspecified: Secondary | ICD-10-CM

## 2024-01-08 DIAGNOSIS — R059 Cough, unspecified: Secondary | ICD-10-CM | POA: Diagnosis not present

## 2024-01-08 DIAGNOSIS — R519 Headache, unspecified: Secondary | ICD-10-CM | POA: Insufficient documentation

## 2024-01-08 DIAGNOSIS — M545 Low back pain, unspecified: Secondary | ICD-10-CM | POA: Insufficient documentation

## 2024-01-08 DIAGNOSIS — Z87891 Personal history of nicotine dependence: Secondary | ICD-10-CM | POA: Diagnosis not present

## 2024-01-08 DIAGNOSIS — E119 Type 2 diabetes mellitus without complications: Secondary | ICD-10-CM | POA: Diagnosis not present

## 2024-01-08 DIAGNOSIS — I890 Lymphedema, not elsewhere classified: Secondary | ICD-10-CM | POA: Diagnosis not present

## 2024-01-08 LAB — RESP PANEL BY RT-PCR (RSV, FLU A&B, COVID)  RVPGX2
Influenza A by PCR: NEGATIVE
Influenza B by PCR: NEGATIVE
Resp Syncytial Virus by PCR: NEGATIVE
SARS Coronavirus 2 by RT PCR: NEGATIVE

## 2024-01-08 MED ORDER — CYCLOBENZAPRINE HCL 10 MG PO TABS
10.0000 mg | ORAL_TABLET | Freq: Once | ORAL | Status: AC
  Administered 2024-01-08: 10 mg via ORAL
  Filled 2024-01-08: qty 1

## 2024-01-08 MED ORDER — GUAIFENESIN ER 600 MG PO TB12
600.0000 mg | ORAL_TABLET | Freq: Two times a day (BID) | ORAL | Status: DC
  Administered 2024-01-08: 600 mg via ORAL
  Filled 2024-01-08: qty 1

## 2024-01-08 MED ORDER — CYCLOBENZAPRINE HCL 10 MG PO TABS: 5.0000 mg | ORAL_TABLET | Freq: Three times a day (TID) | ORAL | 0 refills | Status: DC | PRN

## 2024-01-08 MED ORDER — KETOROLAC TROMETHAMINE 60 MG/2ML IM SOLN
30.0000 mg | Freq: Once | INTRAMUSCULAR | Status: AC
  Administered 2024-01-08: 30 mg via INTRAMUSCULAR
  Filled 2024-01-08: qty 2

## 2024-01-08 MED ORDER — BENZONATATE 100 MG PO CAPS: 100.0000 mg | ORAL_CAPSULE | Freq: Three times a day (TID) | ORAL | 0 refills | Status: DC | PRN

## 2024-01-08 MED ORDER — FLUTICASONE PROPIONATE 50 MCG/ACT NA SUSP
1.0000 | Freq: Every day | NASAL | Status: DC
  Administered 2024-01-08: 1 via NASAL
  Filled 2024-01-08: qty 16

## 2024-01-08 NOTE — Discharge Instructions (Addendum)
 Continue ibuprofen /Tylenol  as needed for back pain.  Continue Mucinex  and Flonase  as needed for nasal congestion, start Tessalon  Perles and use as directed for cough.  Continue Flexeril  for muscle spasms. This medication may cause drowsiness/fatigue.  Return to the emergency department if your symptoms worsen.  Follow-up with your primary care provider if your symptoms persist.  I have provided you with contact information for orthopedic specialist, please schedule follow-up if your back pain persist despite these measures.

## 2024-01-08 NOTE — ED Provider Notes (Signed)
 [place] EMERGENCY DEPARTMENT AT [facility] Provider Note   CSN: [id number] Arrival date & time: 01/08/24  2011     Patient presents with: Back Pain, Headache, and Nasal Congestion   [name] is a 38 y.o. female.   38 year old female presenting with multiple complaints.  Patient notes twisting lower back pain, began after picking up her 71-month-old daughter, denies fall/injury.  She also reports approximately 1 day of nasal congestion/cough/facial pressure.  Denies fever, bowel/bladder incontinence, lower extremity weakness/numbness/tingling.  History of sciatica, reports this does not feel the same.   Back Pain Associated symptoms: headaches   Headache Associated symptoms: back pain        Prior to Admission medications   Medication Sig Start Date End Date Taking? Authorizing Provider  cyclobenzaprine  (FLEXERIL ) 10 MG tablet Take 0.5-1 tablets (5-10 mg total) by mouth 3 (three) times daily as needed. 11/28/23   [facility] Sebastian Pool, NP  escitalopram  (LEXAPRO ) 20 MG tablet Take 1 tablet (20 mg total) by mouth daily. 12/08/23   [facility] Sebastian Pool, NP  ferrous sulfate  325 (65 FE) MG tablet Take 1 tablet (325 mg total) by mouth daily with breakfast. 08/31/23   Deitra Morton Sebastian Pool, NP  Multiple Vitamin (MULTIVITAMIN) tablet Take 1 tablet by mouth daily.    [provider]  naproxen  (NAPROSYN ) 500 MG tablet Take 1 tablet (500 mg total) by mouth 2 (two) times daily with a meal. 06/28/23   Burnette, Delon HERO, PA-C  pantoprazole  (PROTONIX ) 40 MG tablet Take 1 tablet (40 mg total) by mouth daily. 11/28/23   [facility] Sebastian Pool, NP  spironolactone  (ALDACTONE ) 25 MG tablet Take 1 tablet (25 mg total) by mouth daily. 11/28/23   [facility] Sebastian Pool, NP  Vitamin D , Ergocalciferol , (DRISDOL ) 1.25 MG (50000 UNIT) CAPS capsule Take 1 capsule (50,000 Units total) by mouth every 7 (seven) days. 08/31/23   [facility] Sebastian Pool, NP     Allergies: Bee venom, Septra [sulfamethoxazole -trimethoprim], Other, Penicillins, and Vancomycin     Review of Systems  Musculoskeletal:  Positive for back pain.  Neurological:  Positive for headaches.    Updated Vital Signs BP 120/86   Pulse 100   Temp 99 F (37.2 C) (Oral)   SpO2 95%   Physical Exam Vitals and nursing note reviewed.  HENT:     Head: Normocephalic.  Eyes:     Extraocular Movements: Extraocular movements intact.  Cardiovascular:     Rate and Rhythm: Normal rate and regular rhythm.  Pulmonary:     Effort: Pulmonary effort is normal. No respiratory distress.     Breath sounds: Normal breath sounds.  Musculoskeletal:     Cervical back: Normal range of motion.     Comments: 5/5 strength against resistance of bilateral lower extremities Mild TTP of T-spine/L-spine with associated paraspinal muscle TTP bilaterally.  (-) SLR bilaterally Left lower extremity chronic lymphedema   Skin:    General: Skin is warm and dry.  Neurological:     General: No focal deficit present.     Mental Status: She is alert and oriented to person, place, and time.     Sensory: No sensory deficit.     Motor: No weakness.     (all labs ordered are listed, but only abnormal results are displayed) Labs Reviewed  RESP PANEL BY RT-PCR (RSV, FLU A&B, COVID)  [id number]    EKG: None  Radiology: No results found.   Procedures   Medications Ordered in the  ED  guaiFENesin  (MUCINEX ) 12 hr tablet 600 mg (has no administration in time range)  ketorolac  (TORADOL ) injection 30 mg (has no administration in time range)  cyclobenzaprine  (FLEXERIL ) tablet 10 mg (has no administration in time range)  fluticasone  (FLONASE ) 50 MCG/ACT nasal spray 1 spray (has no administration in time range)                                    Medical Decision Making This patient presents to the ED for concern of multiple complaints, this involves an extensive number of treatment options, and is a  complaint that carries with it a high risk of complications and morbidity.  The differential diagnosis includes musculoskeletal pain/strain/sprain, COVID/flu/RSV, other viral illness   Co morbidities that complicate the patient evaluation  Obesity, type 2 diabetes, chronic lymphedema of left lower extremity   Additional history obtained:  Additional history obtained from review External records from outside source obtained and reviewed including prior emergency department discharge summary   Lab Tests:  I Ordered, and personally interpreted labs.  The pertinent results include: COVID/flu/RSV negative.   Cardiac Monitoring: / EKG:  The patient was maintained on a cardiac monitor.  I personally viewed and interpreted the cardiac monitored which showed an underlying rhythm of: NSR   Problem List / ED Course / Critical interventions / Medication management  I ordered medication including Mucinex /Flonase  for congestion/sinus pressure, Flexeril  and Toradol  for back pain Reevaluation of the patient after these medicines showed that the patient improved I have reviewed the patients home medicines and have made adjustments as needed   Social Determinants of Health:  Housing instability, unmet transportation needs, financial instability, former tobacco use   Test / Admission - Considered:  Physical exam notable as above.  Symptoms seem musculoskeletal in nature, I do not feel that imaging is warranted at this time given that there was no specific injury nor is she exhibiting any red flag back pain signs/symptoms.  COVID/flu/RSV negative, I suspect that cough/congestion is secondary to other viral illness.  Lungs are clear to auscultation bilaterally without adventitious sounds, no signs of respiratory distress, I do not feel that chest x-ray is necessary at this time.  Will prescribe patient Tessalon  Perles to be used as needed for cough as well as Flexeril  to be used as needed for back  pain.  Patient has recent prescription for Flexeril , however these were sent to [facility] and she reports that she never received these in the mail, will resend to [facility].  I advised her to continue Tylenol /ibuprofen  at home as needed for back pain.  She voiced understanding and is in agreement with this plan.  Return precautions discussed.  She is appropriate for discharge at this time.    Risk OTC drugs. Prescription drug management.        Final diagnoses:  Cough, unspecified type  Back pain, unspecified back location, unspecified back pain laterality, unspecified chronicity    ED Discharge Orders          Ordered    benzonatate  (TESSALON ) 100 MG capsule  3 times daily PRN        01/08/24 2334    cyclobenzaprine  (FLEXERIL ) 10 MG tablet  3 times daily PRN        01/08/24 2334               Glendia Rocky SAILOR, PA-C 01/08/24 2337    Elnor,  Jayson LABOR, DO 01/16/24 1550

## 2024-01-08 NOTE — ED Triage Notes (Signed)
 Headache x 3 days. C/o congestion, dry cough. C/o lower back pain x 1 day with hx of sciatica per pt. Nad. Chronic left leg lymphedema. No resp distress/shob noted.
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
## 2024-01-24 ENCOUNTER — Encounter: Payer: Self-pay | Admitting: Nurse Practitioner

## 2024-01-24 ENCOUNTER — Ambulatory Visit (INDEPENDENT_AMBULATORY_CARE_PROVIDER_SITE_OTHER): Admitting: Nurse Practitioner

## 2024-01-24 VITALS — BP 94/64 | HR 78 | Temp 97.1°F | Ht 69.0 in | Wt 304.4 lb

## 2024-01-24 DIAGNOSIS — E282 Polycystic ovarian syndrome: Secondary | ICD-10-CM

## 2024-01-24 DIAGNOSIS — L680 Hirsutism: Secondary | ICD-10-CM | POA: Diagnosis not present

## 2024-01-24 DIAGNOSIS — E559 Vitamin D deficiency, unspecified: Secondary | ICD-10-CM | POA: Diagnosis not present

## 2024-01-24 DIAGNOSIS — D509 Iron deficiency anemia, unspecified: Secondary | ICD-10-CM

## 2024-01-24 DIAGNOSIS — M545 Low back pain, unspecified: Secondary | ICD-10-CM | POA: Diagnosis not present

## 2024-01-24 DIAGNOSIS — F411 Generalized anxiety disorder: Secondary | ICD-10-CM

## 2024-01-24 DIAGNOSIS — Z1231 Encounter for screening mammogram for malignant neoplasm of breast: Secondary | ICD-10-CM | POA: Insufficient documentation

## 2024-01-24 DIAGNOSIS — Z09 Encounter for follow-up examination after completed treatment for conditions other than malignant neoplasm: Secondary | ICD-10-CM | POA: Diagnosis not present

## 2024-01-24 MED ORDER — SPIRONOLACTONE 50 MG PO TABS: 50.0000 mg | ORAL_TABLET | Freq: Every day | ORAL | 1 refills | Status: DC

## 2024-01-24 MED ORDER — METFORMIN HCL ER 750 MG PO TB24: 750.0000 mg | ORAL_TABLET | Freq: Every day | ORAL | 1 refills | Status: DC

## 2024-01-24 MED ORDER — CYCLOBENZAPRINE HCL 10 MG PO TABS
5.0000 mg | ORAL_TABLET | Freq: Every day | ORAL | 0 refills | Status: AC
Start: 2024-01-24 — End: ?

## 2024-01-24 MED ORDER — VITAMIN D (ERGOCALCIFEROL) 1.25 MG (50000 UNIT) PO CAPS: 50000.0000 [IU] | ORAL_CAPSULE | ORAL | 0 refills | Status: AC

## 2024-01-24 MED ORDER — FERROUS SULFATE 325 (65 FE) MG PO TABS: 325.0000 mg | ORAL_TABLET | Freq: Every day | ORAL | 1 refills | Status: AC

## 2024-01-25 ENCOUNTER — Ambulatory Visit: Payer: Self-pay | Admitting: Nurse Practitioner

## 2024-01-25 LAB — ANEMIA PROFILE B
Basophils Absolute: 0 x10E3/uL (ref 0.0–0.2)
Basos: 0 %
EOS (ABSOLUTE): 0.1 x10E3/uL (ref 0.0–0.4)
Eos: 1 %
Ferritin: 23 ng/mL (ref 15–150)
Folate: 18.9 ng/mL (ref >3.0)
Hematocrit: 38.1 % (ref 34.0–46.6)
Hemoglobin: 11.9 g/dL (ref 11.1–15.9)
Immature Grans (Abs): 0 x10E3/uL (ref 0.0–0.1)
Immature Granulocytes: 0 %
Iron Saturation: 14 % — AB (ref 15–55)
Iron: 52 ug/dL (ref 27–159)
Lymphocytes Absolute: 2.8 x10E3/uL (ref 0.7–3.1)
Lymphs: 33 %
MCH: 24.2 pg — ABNORMAL LOW (ref 26.6–33.0)
MCHC: 31.2 g/dL — ABNORMAL LOW (ref 31.5–35.7)
MCV: 77 fL — ABNORMAL LOW (ref 79–97)
Monocytes Absolute: 0.5 x10E3/uL (ref 0.1–0.9)
Monocytes: 6 %
Neutrophils Absolute: 5.1 x10E3/uL (ref 1.4–7.0)
Neutrophils: 60 %
Platelets: 287 x10E3/uL (ref 150–450)
RBC: 4.92 x10E6/uL (ref 3.77–5.28)
RDW: 14.8 % (ref 11.7–15.4)
Retic Ct Pct: 1.5 % (ref 0.6–2.6)
Total Iron Binding Capacity: 365 ug/dL (ref 250–450)
UIBC: 313 ug/dL (ref 131–425)
Vitamin B-12: 730 pg/mL (ref 232–1245)
WBC: 8.5 x10E3/uL (ref 3.4–10.8)

## 2024-01-25 LAB — VITAMIN D 25 HYDROXY (VIT D DEFICIENCY, FRACTURES): Vit D, 25-Hydroxy: 21.1 ng/mL — AB (ref 30.0–100.0)

## 2024-02-02 ENCOUNTER — Other Ambulatory Visit: Payer: Self-pay | Admitting: Medical Genetics

## 2024-02-02 DIAGNOSIS — Z006 Encounter for examination for normal comparison and control in clinical research program: Secondary | ICD-10-CM

## 2024-02-12 ENCOUNTER — Other Ambulatory Visit: Payer: Self-pay | Admitting: *Deleted

## 2024-02-12 DIAGNOSIS — F330 Major depressive disorder, recurrent, mild: Secondary | ICD-10-CM

## 2024-02-12 DIAGNOSIS — F411 Generalized anxiety disorder: Secondary | ICD-10-CM

## 2024-02-12 MED ORDER — ESCITALOPRAM OXALATE 20 MG PO TABS: 20.0000 mg | ORAL_TABLET | Freq: Every day | ORAL | 0 refills | Status: DC

## 2024-02-14 ENCOUNTER — Encounter

## 2024-02-28 ENCOUNTER — Ambulatory Visit: Payer: Self-pay

## 2024-02-28 ENCOUNTER — Encounter: Payer: Self-pay | Admitting: Nurse Practitioner

## 2024-03-04 ENCOUNTER — Ambulatory Visit: Admitting: Nurse Practitioner

## 2024-04-16 ENCOUNTER — Telehealth: Payer: Self-pay | Admitting: Nurse Practitioner

## 2024-04-16 NOTE — Telephone Encounter (Signed)
Left message to schedule appt

## 2024-04-16 NOTE — Telephone Encounter (Unsigned)
 Copied from CRM [id number]. Topic: Clinical - Medication Question >> Apr 16, 2024 12:31 PM Sophia H wrote: Reason for CRM: Patient called in to ask if PCP would be willing to up her dose of escitalopram  (LEXAPRO ) - states she does not feel like it is helping much. If unable to up the dosage she is requesting something else in its place. Please advise # [phone]   [facility] [address], [place] - [address]

## 2024-04-18 ENCOUNTER — Other Ambulatory Visit: Payer: Self-pay | Admitting: Nurse Practitioner

## 2024-04-18 DIAGNOSIS — F411 Generalized anxiety disorder: Secondary | ICD-10-CM

## 2024-04-18 DIAGNOSIS — F330 Major depressive disorder, recurrent, mild: Secondary | ICD-10-CM

## 2024-04-22 ENCOUNTER — Other Ambulatory Visit: Payer: Self-pay | Admitting: Nurse Practitioner

## 2024-04-22 DIAGNOSIS — M545 Low back pain, unspecified: Secondary | ICD-10-CM

## 2024-05-06 ENCOUNTER — Encounter: Payer: Self-pay | Admitting: *Deleted

## 2024-05-26 ENCOUNTER — Other Ambulatory Visit: Payer: Self-pay | Admitting: Nurse Practitioner

## 2024-05-26 DIAGNOSIS — L680 Hirsutism: Secondary | ICD-10-CM

## 2024-05-26 DIAGNOSIS — E282 Polycystic ovarian syndrome: Secondary | ICD-10-CM

## 2024-05-28 ENCOUNTER — Other Ambulatory Visit: Payer: Self-pay | Admitting: Nurse Practitioner

## 2024-05-28 DIAGNOSIS — E282 Polycystic ovarian syndrome: Secondary | ICD-10-CM

## 2024-07-24 ENCOUNTER — Ambulatory Visit: Payer: Self-pay | Admitting: Nurse Practitioner

## 2024-07-24 ENCOUNTER — Ambulatory Visit: Admitting: Family Medicine
